# Patient Record
Sex: Female | Born: 1985
Health system: Southern US, Community
[De-identification: ages and names within clinical notes are randomized; demographics above are authoritative.]

## PROBLEM LIST (undated history)

## (undated) DIAGNOSIS — K219 Gastro-esophageal reflux disease without esophagitis: Secondary | ICD-10-CM

## (undated) DIAGNOSIS — E079 Disorder of thyroid, unspecified: Secondary | ICD-10-CM

## (undated) DIAGNOSIS — N200 Calculus of kidney: Secondary | ICD-10-CM

## (undated) DIAGNOSIS — Z9889 Other specified postprocedural states: Secondary | ICD-10-CM

## (undated) DIAGNOSIS — E559 Vitamin D deficiency, unspecified: Secondary | ICD-10-CM

## (undated) DIAGNOSIS — E063 Autoimmune thyroiditis: Secondary | ICD-10-CM

## (undated) DIAGNOSIS — I10 Essential (primary) hypertension: Secondary | ICD-10-CM

## (undated) DIAGNOSIS — J45909 Unspecified asthma, uncomplicated: Secondary | ICD-10-CM

## (undated) DIAGNOSIS — E041 Nontoxic single thyroid nodule: Secondary | ICD-10-CM

## (undated) DIAGNOSIS — R112 Nausea with vomiting, unspecified: Secondary | ICD-10-CM

## (undated) DIAGNOSIS — E88819 Insulin resistance, unspecified: Secondary | ICD-10-CM

## (undated) DIAGNOSIS — R1310 Dysphagia, unspecified: Secondary | ICD-10-CM

## (undated) DIAGNOSIS — T7840XA Allergy, unspecified, initial encounter: Secondary | ICD-10-CM

## (undated) DIAGNOSIS — Z87442 Personal history of urinary calculi: Secondary | ICD-10-CM

## (undated) DIAGNOSIS — F419 Anxiety disorder, unspecified: Secondary | ICD-10-CM

## (undated) DIAGNOSIS — C8290 Follicular lymphoma, unspecified, unspecified site: Secondary | ICD-10-CM

## (undated) DIAGNOSIS — R011 Cardiac murmur, unspecified: Secondary | ICD-10-CM

## (undated) HISTORY — PX: ABDOMINAL HYSTERECTOMY: SHX81

## (undated) HISTORY — PX: WISDOM TOOTH EXTRACTION: SHX21

## (undated) HISTORY — PX: KIDNEY STONE SURGERY: SHX686

## (undated) HISTORY — DX: Gastro-esophageal reflux disease without esophagitis: K21.9

## (undated) HISTORY — PX: TONSILLECTOMY: SUR1361

## (undated) HISTORY — DX: Allergy, unspecified, initial encounter: T78.40XA

## (undated) HISTORY — DX: Dysphagia, unspecified: R13.10

## (undated) HISTORY — DX: Disorder of thyroid, unspecified: E07.9

## (undated) HISTORY — DX: Calculus of kidney: N20.0

## (undated) HISTORY — DX: Essential (primary) hypertension: I10

## (undated) HISTORY — DX: Anxiety disorder, unspecified: F41.9

---
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
## 2016-01-03 ENCOUNTER — Ambulatory Visit (INDEPENDENT_AMBULATORY_CARE_PROVIDER_SITE_OTHER): Payer: Medicaid Other | Admitting: Family Medicine

## 2016-01-03 ENCOUNTER — Encounter: Payer: Self-pay | Admitting: Family Medicine

## 2016-01-03 VITALS — BP 118/80 | HR 78 | Temp 98.8°F | Ht 67.0 in | Wt 201.6 lb

## 2016-01-03 DIAGNOSIS — E038 Other specified hypothyroidism: Secondary | ICD-10-CM | POA: Diagnosis not present

## 2016-01-03 DIAGNOSIS — E039 Hypothyroidism, unspecified: Secondary | ICD-10-CM | POA: Insufficient documentation

## 2016-01-03 MED ORDER — ARMOUR THYROID 15 MG PO TABS: 15.0000 mg | ORAL_TABLET | Freq: Every day | ORAL | Status: DC

## 2016-01-03 NOTE — Progress Notes (Signed)
BP 118/80 mmHg  Pulse 78  Temp(Src) 98.8 F (37.1 C) (Oral)  Ht 5' 7"  (1.702 m)  Wt 201 lb 9.6 oz (91.445 kg)  BMI 31.57 kg/m2   Subjective:    Patient ID: Betty Hayes, female    DOB: October 29, 1985, 30 y.o.   MRN: [id number]  HPI: Betty Hayes is a 30 y.o. female presenting on 01/03/2016 for Establish Care   HPI Hypothyroidism Patient is currently on Armour Thyroid where she takes 30 mg 4 days a week and 50 mg 3 days a week. She says she has had issues with her thyroid and weight gain and fatigue for the past few years and has been started on the medication. She denies any issues with heat or cold. He denies any issues with diarrhea or constipation.  Relevant past medical, surgical, family and social history reviewed and updated as indicated. Interim medical history since our last visit reviewed. Allergies and medications reviewed and updated.  Review of Systems  Constitutional: Positive for fatigue and unexpected weight change. Negative for fever and chills.  HENT: Negative for congestion, ear discharge and ear pain.   Eyes: Negative for redness and visual disturbance.  Respiratory: Negative for chest tightness and shortness of breath.   Cardiovascular: Negative for chest pain and leg swelling.  Genitourinary: Negative for dysuria and difficulty urinating.  Musculoskeletal: Negative for back pain and gait problem.  Skin: Negative for rash.  Neurological: Negative for dizziness, light-headedness and headaches.  Psychiatric/Behavioral: Negative for behavioral problems and agitation.  All other systems reviewed and are negative.   Per HPI unless specifically indicated above  Social History   Social History  . Marital Status: Married    Spouse Name: N/A  . Number of Children: N/A  . Years of Education: N/A   Occupational History  . Not on file.   Social History Main Topics  . Smoking status: Never Smoker   . Smokeless tobacco: Never Used  . Alcohol Use: Yes   Comment: occasional  . Drug Use: No  . Sexual Activity: Yes    Birth Control/ Protection: Surgical     Comment: married 14 years   Other Topics Concern  . Not on file   Social History Narrative  . No narrative on file    Past Surgical History  Procedure Laterality Date  . Abdominal hysterectomy    . Tonsillectomy    . Wisdom tooth extraction    . Kidney stone surgery      Family History  Problem Relation Age of Onset  . Thrombocytopenia Mother     itp  . Heart disease Mother   . Hypertension Mother   . Seizures Brother   . ADD / ADHD Brother       Medication List       This list is accurate as of: 01/03/16  2:41 PM.  Always use your most recent med list.               ARMOUR THYROID 15 MG tablet  Generic drug:  thyroid  Take 1 tablet (15 mg total) by mouth daily. 1 daily 3/7 days and 2 daily 4/7     fexofenadine 180 MG tablet  Commonly known as:  ALLEGRA  Take 180 mg by mouth daily.           Objective:    BP 118/80 mmHg  Pulse 78  Temp(Src) 98.8 F (37.1 C) (Oral)  Ht 5' 7"  (1.702 m)  Wt 201 lb 9.6 oz (  91.445 kg)  BMI 31.57 kg/m2  Wt Readings from Last 3 Encounters:  01/03/16 201 lb 9.6 oz (91.445 kg)    Physical Exam  Constitutional: She is oriented to person, place, and time. She appears well-developed and well-nourished. No distress.  HENT:  Right Ear: External ear normal.  Nose: Nose normal.  Mouth/Throat: Oropharynx is clear and moist. No oropharyngeal exudate.  Eyes: Conjunctivae and EOM are normal. Pupils are equal, round, and reactive to light.  Neck: Neck supple. No thyromegaly present.  Cardiovascular: Normal rate, regular rhythm, normal heart sounds and intact distal pulses.   No murmur heard. Pulmonary/Chest: Effort normal and breath sounds normal. No respiratory distress. She has no wheezes.  Musculoskeletal: Normal range of motion. She exhibits no edema or tenderness.  Lymphadenopathy:    She has no cervical adenopathy.    Neurological: She is alert and oriented to person, place, and time. Coordination normal.  Skin: Skin is warm and dry. No rash noted. She is not diaphoretic.  Psychiatric: She has a normal mood and affect. Her behavior is normal.  Nursing note and vitals reviewed.   No results found for this or any previous visit.    Assessment & Plan:   Problem List Items Addressed This Visit      Endocrine   Hypothyroidism - Primary   Relevant Medications   ARMOUR THYROID 15 MG tablet   Other Relevant Orders   Thyroid Panel With TSH   Ambulatory referral to Endocrinology      Follow up plan: Return in about 6 weeks (around 02/14/2016), or if symptoms worsen or fail to improve, for thyroid.  Caryl Pina, MD [facility] 01/03/2016, 2:41 PM

## 2016-01-04 ENCOUNTER — Other Ambulatory Visit: Payer: Self-pay

## 2016-01-04 DIAGNOSIS — E039 Hypothyroidism, unspecified: Secondary | ICD-10-CM

## 2016-01-04 LAB — THYROID PANEL WITH TSH
Free Thyroxine Index: 1.5 (ref 1.2–4.9)
T3 Uptake Ratio: 24 % (ref 24–39)
T4, Total: 6.2 ug/dL (ref 4.5–12.0)
TSH: 4.96 u[IU]/mL — ABNORMAL HIGH (ref 0.450–4.500)

## 2016-02-05 ENCOUNTER — Encounter: Payer: Self-pay | Admitting: Family

## 2016-02-05 ENCOUNTER — Ambulatory Visit (INDEPENDENT_AMBULATORY_CARE_PROVIDER_SITE_OTHER): Payer: Medicaid Other | Admitting: Family

## 2016-02-05 VITALS — BP 118/86 | HR 64 | Temp 98.2°F | Ht 67.0 in | Wt 198.2 lb

## 2016-02-05 DIAGNOSIS — H60391 Other infective otitis externa, right ear: Secondary | ICD-10-CM

## 2016-02-05 MED ORDER — OFLOXACIN 0.3 % OT SOLN: 5.0000 [drp] | Freq: Every day | OTIC | Status: DC

## 2016-02-05 NOTE — Progress Notes (Signed)
   Subjective:    Patient ID: Betty Hayes, female    DOB: 21-Apr-1986, 30 y.o.   MRN: [id number]  Otalgia  There is pain in the right ear. This is a new problem. The current episode started in the past 7 days. The problem occurs constantly. The problem has been gradually worsening. The pain is at a severity of 5/10. The pain is mild. Associated symptoms include hearing loss. Pertinent negatives include no coughing, diarrhea, ear discharge, headaches, rhinorrhea or sore throat. She has tried acetaminophen for the symptoms. The treatment provided mild relief.      Review of Systems  Constitutional: Negative.   HENT: Positive for ear pain and hearing loss. Negative for ear discharge, rhinorrhea and sore throat.   Eyes: Negative.   Respiratory: Negative.  Negative for cough and shortness of breath.   Cardiovascular: Negative.  Negative for palpitations.  Gastrointestinal: Negative.  Negative for diarrhea.  Endocrine: Negative.   Genitourinary: Negative.   Musculoskeletal: Negative.   Neurological: Negative.  Negative for headaches.  Hematological: Negative.   Psychiatric/Behavioral: Negative.   All other systems reviewed and are negative.      Objective:   Physical Exam  Constitutional: She is oriented to person, place, and time. She appears well-developed and well-nourished. No distress.  HENT:  Head: Normocephalic and atraumatic.  Right Ear: There is swelling and tenderness.  Mouth/Throat: Oropharynx is clear and moist.  Eyes: Pupils are equal, round, and reactive to light.  Neck: Normal range of motion. Neck supple. No thyromegaly present.  Cardiovascular: Normal rate, regular rhythm, normal heart sounds and intact distal pulses.   No murmur heard. Pulmonary/Chest: Effort normal and breath sounds normal. No respiratory distress. She has no wheezes.  Abdominal: Soft. Bowel sounds are normal. She exhibits no distension. There is no tenderness.  Musculoskeletal: Normal range of  motion. She exhibits no edema or tenderness.  Neurological: She is alert and oriented to person, place, and time. She has normal reflexes. No cranial nerve deficit.  Skin: Skin is warm and dry.  Psychiatric: She has a normal mood and affect. Her behavior is normal. Judgment and thought content normal.  Vitals reviewed.     BP 118/86 mmHg  Pulse 64  Temp(Src) 98.2 F (36.8 C) (Oral)  Ht 5\' 7"  (1.702 m)  Wt 198 lb 3.2 oz (89.903 kg)  BMI 31.04 kg/m2     Assessment & Plan:  1. Otitis, externa, infective, right -Keep clean and dry -Avoid swimming in dirty water -Avoid water in the ear -Tylenol prn  -RTO prn - ofloxacin (FLOXIN OTIC) 0.3 % otic solution; Place 5 drops into the right ear daily.  Dispense: 5 mL; Refill: 0  Evelina Dun, FNP

## 2016-02-05 NOTE — Patient Instructions (Signed)
Otitis Externa Otitis externa is a bacterial or fungal infection of the outer ear canal. This is the area from the eardrum to the outside of the ear. Otitis externa is sometimes called "swimmer's ear." CAUSES  Possible causes of infection include:  Swimming in dirty water.  Moisture remaining in the ear after swimming or bathing.  Mild injury (trauma) to the ear.  Objects stuck in the ear (foreign body).  Cuts or scrapes (abrasions) on the outside of the ear. SIGNS AND SYMPTOMS  The first symptom of infection is often itching in the ear canal. Later signs and symptoms may include swelling and redness of the ear canal, ear pain, and yellowish-white fluid (pus) coming from the ear. The ear pain may be worse when pulling on the earlobe. DIAGNOSIS  Your health care provider will perform a physical exam. A sample of fluid may be taken from the ear and examined for bacteria or fungi. TREATMENT  Antibiotic ear drops are often given for 10 to 14 days. Treatment may also include pain medicine or corticosteroids to reduce itching and swelling. HOME CARE INSTRUCTIONS   Apply antibiotic ear drops to the ear canal as prescribed by your health care provider.  Take medicines only as directed by your health care provider.  If you have diabetes, follow any additional treatment instructions from your health care provider.  Keep all follow-up visits as directed by your health care provider. PREVENTION   Keep your ear dry. Use the corner of a towel to absorb water out of the ear canal after swimming or bathing.  Avoid scratching or putting objects inside your ear. This can damage the ear canal or remove the protective wax that lines the canal. This makes it easier for bacteria and fungi to grow.  Avoid swimming in lakes, polluted water, or poorly chlorinated pools.  You may use ear drops made of rubbing alcohol and vinegar after swimming. Combine equal parts of white vinegar and alcohol in a bottle.  Put 3 or 4 drops into each ear after swimming. SEEK MEDICAL CARE IF:   You have a fever.  Your ear is still red, swollen, painful, or draining pus after 3 days.  Your redness, swelling, or pain gets worse.  You have a severe headache.  You have redness, swelling, pain, or tenderness in the area behind your ear. MAKE SURE YOU:   Understand these instructions.  Will watch your condition.  Will get help right away if you are not doing well or get worse.   This information is not intended to replace advice given to you by your health care provider. Make sure you discuss any questions you have with your health care provider.   Document Released: 07/07/2005 Document Revised: 07/28/2014 Document Reviewed: 07/24/2011 Elsevier Interactive Patient Education [employer].

## 2016-02-13 ENCOUNTER — Ambulatory Visit (INDEPENDENT_AMBULATORY_CARE_PROVIDER_SITE_OTHER): Payer: Medicaid Other | Admitting: "Endocrinology

## 2016-02-13 ENCOUNTER — Encounter: Payer: Self-pay | Admitting: "Endocrinology

## 2016-02-13 VITALS — BP 124/60 | HR 78 | Resp 18 | Ht 67.0 in | Wt 198.0 lb

## 2016-02-13 DIAGNOSIS — E038 Other specified hypothyroidism: Secondary | ICD-10-CM

## 2016-02-13 MED ORDER — LEVOTHYROXINE SODIUM 50 MCG PO TABS: 50.0000 ug | ORAL_TABLET | Freq: Every day | ORAL | 2 refills | Status: DC

## 2016-02-13 NOTE — Progress Notes (Signed)
Subjective:    Patient ID: Betty Hayes, female    DOB: 02-23-1986, PCP Fransisca Kaufmann Dettinger, MD   Past Medical History:  Diagnosis Date  . Allergy   . Thyroid disease    Past Surgical History:  Procedure Laterality Date  . ABDOMINAL HYSTERECTOMY    . KIDNEY STONE SURGERY    . TONSILLECTOMY    . WISDOM TOOTH EXTRACTION     Social History   Social History  . Marital status: Married    Spouse name: N/A  . Number of children: N/A  . Years of education: N/A   Social History Main Topics  . Smoking status: Never Smoker  . Smokeless tobacco: Never Used  . Alcohol use Yes     Comment: occasional  . Drug use: No  . Sexual activity: Yes    Birth control/ protection: Surgical     Comment: married 14 years   Other Topics Concern  . None   Social History Narrative  . None   Outpatient Encounter Prescriptions as of 02/13/2016  Medication Sig  . fexofenadine (ALLEGRA) 180 MG tablet Take 180 mg by mouth daily.  . [DISCONTINUED] ARMOUR THYROID 15 MG tablet Take 1 tablet (15 mg total) by mouth daily. 1 daily 3/7 days and 2 daily 4/7  . levothyroxine (SYNTHROID, LEVOTHROID) 50 MCG tablet Take 1 tablet (50 mcg total) by mouth daily.  . [DISCONTINUED] levothyroxine (SYNTHROID, LEVOTHROID) 50 MCG tablet Take 1 tablet (50 mcg total) by mouth daily.  . [DISCONTINUED] ofloxacin (FLOXIN OTIC) 0.3 % otic solution Place 5 drops into the right ear daily.   No facility-administered encounter medications on file as of 02/13/2016.    ALLERGIES: Allergies  Allergen Reactions  . Latex Rash   VACCINATION STATUS:  There is no immunization history on file for this patient.  HPI  30 year old female patient with medical history as above. She is being seen in consultation for hypothyroidism requested by Dr. Warrick Parisian. She reports that she was diagnosed with hypothyroidism 4 years ago in [place]. She was tried on several doses of thyroid hormone including 50 g of levothyroxine which caused  anxiety before she was switched to Armour thyroid currently taking 30 mg 4 days week and 50 mg 3 days a week. -She always had difficulty regulating her thyroid function tests. -She denies family history of thyroid dysfunction. She denies personal history of goiter. She claims to be consistent in taking her thyroid hormone. -She never tried Synthroid. She has 2 children .  Review of Systems  Constitutional: no recent weight change , + fatigue, + subjective hypothermia Eyes: no blurry vision, no xerophthalmia ENT: no sore throat, no nodules palpated in throat, no dysphagia/odynophagia, no hoarseness Cardiovascular: no CP/SOB/palpitations/leg swelling Respiratory: no cough/SOB Gastrointestinal: no N/V/D/C Musculoskeletal: no muscle/joint aches Skin: no rashes Neurological: no tremors/numbness/tingling/dizziness Psychiatric: no depression/anxiety  Objective:    BP 124/60 (BP Location: Right Arm, Patient Position: Sitting, Cuff Size: Normal)   Pulse 78   Resp 18   Ht 5\' 7"  (1.702 m)   Wt 198 lb (89.8 kg)   SpO2 99%   BMI 31.01 kg/m   Wt Readings from Last 3 Encounters:  02/13/16 198 lb (89.8 kg)  02/05/16 198 lb 3.2 oz (89.9 kg)  01/03/16 201 lb 9.6 oz (91.4 kg)    Physical Exam Constitutional: overweight, in NAD Eyes: PERRLA, EOMI, no exophthalmos ENT: moist mucous membranes, no thyromegaly, no cervical lymphadenopathy Cardiovascular: RRR, No MRG Respiratory: CTA B Gastrointestinal: abdomen soft, NT, ND,  BS+ Musculoskeletal: no deformities, strength intact in all 4 Skin: moist, warm, no rashes Neurological: no tremor with outstretched hands, DTR normal in all 4  Results for Betty Hayes, Betty Hayes (MRN [id number]) as of 02/13/2016 12:23  Ref. Range 01/03/2016 14:44  TSH Latest Ref Range: 0.450 - 4.500 uIU/mL 4.960 (H)  Thyroxine (T4) Latest Ref Range: 4.5 - 12.0 ug/dL 6.2  Free Thyroxine Index Latest Ref Range: 1.2 - 4.9  1.5  T3 Uptake Ratio Latest Ref Range: 24 - 39 % 24      Assessment & Plan:   1. Other specified hypothyroidism - I have reviewed her history , records, and clinically evaluated patient. -She has primary hypothyroidism a theology not clear. - I will include anti-thyroid antibodies during her next lab work to see if she has autoimmune background. We discussed her options for thyroid hormone replacement. -She will do better with Synthroid than Armour Thyroid. Given her history of intolerance to initial trial of levothyroxine, I will avoid levothyroxine specifically at large doses. - I will initiate Synthroid 50 g by mouth every morning. She will discontinue Armour Thyroid as of today.  - We discussed about correct intake of levothyroxine, at fasting, with water, separated by at least 30 minutes from breakfast, and separated by more than 4 hours from calcium, iron, multivitamins, acid reflux medications (PPIs). -Patient is made aware of the fact that thyroid hormone replacement is needed for life, dose to be adjusted by periodic monitoring of thyroid function tests.   - I advised patient to maintain close follow up with Worthy Rancher, MD for primary care needs. Follow up plan: Return in about 3 months (around 05/15/2016) for follow up with pre-visit labs.  Glade Lloyd, MD Phone: [phone]  Fax: [phone]   02/13/2016, 12:20 PM

## 2016-02-14 ENCOUNTER — Ambulatory Visit: Payer: Medicaid Other | Admitting: Family Medicine

## 2016-05-05 ENCOUNTER — Other Ambulatory Visit: Payer: Self-pay | Admitting: "Endocrinology

## 2016-05-14 ENCOUNTER — Other Ambulatory Visit (INDEPENDENT_AMBULATORY_CARE_PROVIDER_SITE_OTHER): Payer: Medicaid Other

## 2016-05-14 LAB — TSH: TSH: 3.1 u[IU]/mL (ref <=5.90)

## 2016-05-15 LAB — TSH: TSH: 3.1 u[IU]/mL (ref 0.450–4.500)

## 2016-05-15 LAB — THYROGLOBULIN ANTIBODY: Thyroglobulin Antibody: 1.9 IU/mL — ABNORMAL HIGH (ref 0.0–0.9)

## 2016-05-15 LAB — T4, FREE: Free T4: 1.3 ng/dL (ref 0.82–1.77)

## 2016-05-15 LAB — THYROID PEROXIDASE ANTIBODY: Thyroperoxidase Ab SerPl-aCnc: 474 IU/mL — ABNORMAL HIGH (ref 0–34)

## 2016-05-22 ENCOUNTER — Encounter: Payer: Self-pay | Admitting: "Endocrinology

## 2016-05-22 ENCOUNTER — Ambulatory Visit (INDEPENDENT_AMBULATORY_CARE_PROVIDER_SITE_OTHER): Payer: Medicaid Other | Admitting: "Endocrinology

## 2016-05-22 VITALS — BP 108/70 | HR 90 | Resp 18 | Ht 67.0 in | Wt 205.0 lb

## 2016-05-22 DIAGNOSIS — E038 Other specified hypothyroidism: Secondary | ICD-10-CM

## 2016-05-22 DIAGNOSIS — E063 Autoimmune thyroiditis: Secondary | ICD-10-CM | POA: Diagnosis not present

## 2016-05-22 MED ORDER — LEVOTHYROXINE SODIUM 75 MCG PO TABS: 75.0000 ug | ORAL_TABLET | Freq: Every day | ORAL | 2 refills | Status: DC

## 2016-05-22 NOTE — Progress Notes (Signed)
Subjective:    Patient ID: Betty Hayes, female    DOB: 07/20/1986, PCP Fransisca Kaufmann Dettinger, MD   Past Medical History:  Diagnosis Date  . Allergy   . Thyroid disease    Past Surgical History:  Procedure Laterality Date  . ABDOMINAL HYSTERECTOMY    . KIDNEY STONE SURGERY    . TONSILLECTOMY    . WISDOM TOOTH EXTRACTION     Social History   Social History  . Marital status: Married    Spouse name: N/A  . Number of children: N/A  . Years of education: N/A   Social History Main Topics  . Smoking status: Never Smoker  . Smokeless tobacco: Never Used  . Alcohol use Yes     Comment: occasional  . Drug use: No  . Sexual activity: Yes    Birth control/ protection: Surgical     Comment: married 14 years   Other Topics Concern  . Not on file   Social History Narrative  . No narrative on file   Outpatient Encounter Prescriptions as of 05/22/2016  Medication Sig  . fexofenadine (ALLEGRA) 180 MG tablet Take 180 mg by mouth daily.  . [DISCONTINUED] SYNTHROID 50 MCG tablet TAKE 1 TABLET EVERY DAY  . levothyroxine (SYNTHROID) 75 MCG tablet Take 1 tablet (75 mcg total) by mouth daily before breakfast.   No facility-administered encounter medications on file as of 05/22/2016.    ALLERGIES: Allergies  Allergen Reactions  . Latex Rash   VACCINATION STATUS:  There is no immunization history on file for this patient.  HPI  30 year old female patient with medical history as above. She is being seen in f/u for hypothyroidism. She was switched from Armour thyroid 2 Synthroid last visit. Her labs are showing improvement and confirm Hashimoto's thyroiditis as a cause of her hypothyroidism.. She reports that she was diagnosed with hypothyroidism 4 years ago in [place]. -She always had difficulty regulating her thyroid function tests, when she was on Armour Thyroid. -She denies family history of thyroid dysfunction. She denies personal history of goiter. She claims to be  consistent in taking her thyroid hormone. - She has 2 children .  Review of Systems  Constitutional: She gained 7 pounds since last visit , + fatigue, + subjective hypothermia Eyes: no blurry vision, no xerophthalmia ENT: no sore throat, no nodules palpated in throat, no dysphagia/odynophagia, no hoarseness Cardiovascular: no CP/SOB/palpitations/leg swelling Respiratory: no cough/SOB Gastrointestinal: no N/V/D/C Musculoskeletal: no muscle/joint aches Skin: Complains of acne. Neurological: no tremors/numbness/tingling/dizziness Psychiatric: no depression/anxiety  Objective:    BP 108/70   Pulse 90   Resp 18   Ht 5\' 7"  (1.702 m)   Wt 205 lb (93 kg)   SpO2 99%   BMI 32.11 kg/m   Wt Readings from Last 3 Encounters:  05/22/16 205 lb (93 kg)  02/13/16 198 lb (89.8 kg)  02/05/16 198 lb 3.2 oz (89.9 kg)    Physical Exam Constitutional: overweight, in NAD Eyes: PERRLA, EOMI, no exophthalmos ENT: moist mucous membranes, no thyromegaly, no cervical lymphadenopathy Cardiovascular: RRR, No MRG Respiratory: CTA B Gastrointestinal: abdomen soft, NT, ND, BS+ Musculoskeletal: no deformities, strength intact in all 4 Skin: moist, warm, no rashes Neurological: no tremor with outstretched hands, DTR normal in all 4  Recent Results (from the past 2160 hour(s))  TSH     Status: None   Collection Time: 05/14/16 12:00 AM  Result Value Ref Range   TSH 3.10 0.41 - 5.90 uIU/mL    Comment:  free t4, thyroglobulin abselevated at 1.9, TPO abs high @474       Assessment & Plan:   1. Other specified hypothyroidism 2. Hashimoto's thyroiditis - Her repeat thyroid function tests are showing improvement although she would benefit from slight increase in her Synthroid dose. And these labs confirm Hashimoto's thyroiditis as a cause of her hypothyroidism. - I will increase Synthroid to 75 g by mouth every morning.   - We discussed about correct intake of levothyroxine, at fasting, with water,  separated by at least 30 minutes from breakfast, and separated by more than 4 hours from calcium, iron, multivitamins, acid reflux medications (PPIs). -Patient is made aware of the fact that thyroid hormone replacement is needed for life, dose to be adjusted by periodic monitoring of thyroid function tests.  -Synthroid is unlikely to be the cause of her acne. She states she is initiated on Spironolactone by her primary care doctor. - I will add A1c to her next labs due to concern over recent weight gain. - I advised patient to maintain close follow up with Worthy Rancher, MD for primary care needs. Follow up plan: Return in about 3 months (around 08/22/2016) for follow up with pre-visit labs.  Glade Lloyd, MD Phone: [phone]  Fax: [phone]   05/22/2016, 11:35 AM

## 2016-08-15 ENCOUNTER — Other Ambulatory Visit: Payer: Self-pay | Admitting: "Endocrinology

## 2016-08-15 ENCOUNTER — Other Ambulatory Visit: Payer: Medicaid Other

## 2016-08-16 LAB — SPECIMEN STATUS

## 2016-08-19 LAB — TSH: TSH: 0.096 u[IU]/mL — ABNORMAL LOW (ref 0.450–4.500)

## 2016-08-19 LAB — T4, FREE: Free T4: 1.94 ng/dL — ABNORMAL HIGH (ref 0.82–1.77)

## 2016-08-20 LAB — HGB A1C W/O EAG: Hgb A1c MFr Bld: 5 % (ref 4.8–5.6)

## 2016-08-20 LAB — SPECIMEN STATUS REPORT

## 2016-08-26 ENCOUNTER — Other Ambulatory Visit: Payer: Self-pay | Admitting: "Endocrinology

## 2016-08-26 ENCOUNTER — Encounter: Payer: Self-pay | Admitting: "Endocrinology

## 2016-08-26 ENCOUNTER — Ambulatory Visit (INDEPENDENT_AMBULATORY_CARE_PROVIDER_SITE_OTHER): Payer: Medicaid Other | Admitting: "Endocrinology

## 2016-08-26 VITALS — BP 134/88 | HR 83 | Ht 67.0 in | Wt 200.0 lb

## 2016-08-26 DIAGNOSIS — E063 Autoimmune thyroiditis: Secondary | ICD-10-CM

## 2016-08-26 DIAGNOSIS — E038 Other specified hypothyroidism: Secondary | ICD-10-CM | POA: Diagnosis not present

## 2016-08-26 MED ORDER — LEVOTHYROXINE SODIUM 50 MCG PO CAPS: 50.0000 ug | ORAL_CAPSULE | Freq: Every day | ORAL | 6 refills | Status: DC

## 2016-08-26 MED ORDER — LEVOTHYROXINE SODIUM 50 MCG PO CAPS
50.0000 ug | ORAL_CAPSULE | Freq: Every day | ORAL | 6 refills | Status: DC
Start: 2016-08-26 — End: 2016-08-26

## 2016-08-26 NOTE — Progress Notes (Signed)
Subjective:    Patient ID: Betty Hayes, female    DOB: 04/06/1986, PCP Fransisca Kaufmann Dettinger, MD   Past Medical History:  Diagnosis Date  . Allergy   . Thyroid disease    Past Surgical History:  Procedure Laterality Date  . ABDOMINAL HYSTERECTOMY    . KIDNEY STONE SURGERY    . TONSILLECTOMY    . WISDOM TOOTH EXTRACTION     Social History   Social History  . Marital status: Married    Spouse name: N/A  . Number of children: N/A  . Years of education: N/A   Social History Main Topics  . Smoking status: Never Smoker  . Smokeless tobacco: Never Used  . Alcohol use Yes     Comment: occasional  . Drug use: No  . Sexual activity: Yes    Birth control/ protection: Surgical     Comment: married 14 years   Other Topics Concern  . None   Social History Narrative  . None   Outpatient Encounter Prescriptions as of 08/26/2016  Medication Sig  . fexofenadine (ALLEGRA) 180 MG tablet Take 180 mg by mouth daily.  . Levothyroxine Sodium 50 MCG CAPS Take 1 capsule (50 mcg total) by mouth daily before breakfast.  . [DISCONTINUED] levothyroxine (SYNTHROID) 75 MCG tablet Take 1 tablet (75 mcg total) by mouth daily before breakfast.   No facility-administered encounter medications on file as of 08/26/2016.    ALLERGIES: Allergies  Allergen Reactions  . Latex Rash   VACCINATION STATUS:  There is no immunization history on file for this patient.  HPI  31 year old female patient with medical history as above. She is being seen in f/u for hypothyroidism.  Her labs are confirmed Hashimoto's thyroiditis as a cause of her hypothyroidism.. She reports that she was diagnosed with hypothyroidism 5 years ago in [place]. -She always had difficulty regulating her thyroid function tests, when she was on [employer] Thyroid. - She is currently on levothyroxine 75 g by mouth every morning. She is tolerating this medication very well. -She denies family history of thyroid dysfunction. She denies  personal history of goiter. She claims to be consistent in taking her thyroid hormone. - She has 2 children .  Review of Systems  Constitutional: She lost 5 pounds since last visit,- fatigue, + + subjective hyperthermia and sweating.  Eyes: no blurry vision, no xerophthalmia ENT: no sore throat, no nodules palpated in throat, no dysphagia/odynophagia, no hoarseness Cardiovascular: no CP/SOB/palpitations/leg swelling Respiratory: no cough/SOB Gastrointestinal: no N/V/D/C Musculoskeletal: no muscle/joint aches Skin: Complains of acne. Neurological: no tremors/numbness/tingling/dizziness Psychiatric: no depression/anxiety  Objective:    BP 134/88   Pulse 83   Ht 5\' 7"  (1.702 m)   Wt 200 lb (90.7 kg)   BMI 31.32 kg/m   Wt Readings from Last 3 Encounters:  08/26/16 200 lb (90.7 kg)  05/22/16 205 lb (93 kg)  02/13/16 198 lb (89.8 kg)    Physical Exam Constitutional: overweight, in NAD Eyes: PERRLA, EOMI, no exophthalmos ENT: moist mucous membranes, no thyromegaly, no cervical lymphadenopathy Cardiovascular: RRR, No MRG Respiratory: CTA B Gastrointestinal: abdomen soft, NT, ND, BS+ Musculoskeletal: no deformities, strength intact in all 4 Skin: moist, warm, no rashes Neurological: no tremor with outstretched hands, DTR normal in all 4  Recent Results (from the past 2160 hour(s))  Specimen Status     Status: None (Preliminary result)   Collection Time: 08/15/16 12:00 AM  Result Value Ref Range   WBC WILL FOLLOW  RBC WILL FOLLOW    Hemoglobin WILL FOLLOW    Hematocrit WILL FOLLOW    MCV WILL FOLLOW    MCH WILL FOLLOW    MCHC WILL FOLLOW    RDW WILL FOLLOW    Platelets WILL FOLLOW    Neutrophils WILL FOLLOW    Lymphs WILL FOLLOW    Monocytes WILL FOLLOW    Eos WILL FOLLOW    Basos WILL FOLLOW    Neutrophils Absolute WILL FOLLOW    Lymphocytes Absolute WILL FOLLOW    Monocytes Absolute WILL FOLLOW    EOS (ABSOLUTE) WILL FOLLOW    Basophils Absolute WILL FOLLOW     Immature Granulocytes WILL FOLLOW    Immature Grans (Abs) WILL FOLLOW   T4, free     Status: Abnormal   Collection Time: 08/15/16 12:00 AM  Result Value Ref Range   Free T4 1.94 (H) 0.82 - 1.77 ng/dL  TSH     Status: Abnormal   Collection Time: 08/15/16 12:00 AM  Result Value Ref Range   TSH 0.096 (L) 0.450 - 4.500 uIU/mL  Hgb A1c w/o eAG     Status: None   Collection Time: 08/15/16 12:00 AM  Result Value Ref Range   Hgb A1c MFr Bld 5.0 4.8 - 5.6 %    Comment:          Pre-diabetes: 5.7 - 6.4          Diabetes: >6.4          Glycemic control for adults with diabetes: <7.0   Specimen status report     Status: None   Collection Time: 08/15/16 12:00 AM  Result Value Ref Range   specimen status report Comment     Comment: Tracking Missed Test Written Authorization Written Authorization Written Authorization Received. Authorization received from [place] 08-19-2016 Logged by [employer]:   1. Other specified hypothyroidism 2. Hashimoto's thyroiditis - Her repeat thyroid function tests are Consistent with over replacement with levothyroxine. - I will decrease Synthroid to 50 g by mouth every morning.   - We discussed about correct intake of levothyroxine, at fasting, with water, separated by at least 30 minutes from breakfast, and separated by more than 4 hours from calcium, iron, multivitamins, acid reflux medications (PPIs). -Patient is made aware of the fact that thyroid hormone replacement is needed for life, dose to be adjusted by periodic monitoring of thyroid function tests.  - Her A1c is 5% indicating absence of diabetes/prediabetes for now. I discussed carbs and exercise regimen for her to control her weight. - I advised patient to maintain close follow up with Worthy Rancher, MD for primary care needs. Follow up plan: Return in about 6 months (around 02/23/2017) for follow up with pre-visit labs.  Glade Lloyd, MD Phone:  [phone]  Fax: [phone]   08/26/2016, 11:37 AM

## 2017-02-16 ENCOUNTER — Other Ambulatory Visit: Payer: Self-pay | Admitting: "Endocrinology

## 2017-02-16 ENCOUNTER — Other Ambulatory Visit: Payer: Managed Care, Other (non HMO)

## 2017-02-16 DIAGNOSIS — E038 Other specified hypothyroidism: Secondary | ICD-10-CM

## 2017-02-16 DIAGNOSIS — E063 Autoimmune thyroiditis: Principal | ICD-10-CM

## 2017-02-16 NOTE — Progress Notes (Signed)
NOTED THANKS DR NIDA

## 2017-02-17 LAB — T4, FREE: Free T4: 1.19 ng/dL (ref 0.82–1.77)

## 2017-02-17 LAB — TSH: TSH: 3.65 u[IU]/mL (ref 0.450–4.500)

## 2017-02-24 ENCOUNTER — Encounter: Payer: Self-pay | Admitting: "Endocrinology

## 2017-02-24 ENCOUNTER — Ambulatory Visit (INDEPENDENT_AMBULATORY_CARE_PROVIDER_SITE_OTHER): Payer: Managed Care, Other (non HMO) | Admitting: "Endocrinology

## 2017-02-24 VITALS — BP 112/79 | HR 74 | Wt 201.0 lb

## 2017-02-24 DIAGNOSIS — E038 Other specified hypothyroidism: Secondary | ICD-10-CM | POA: Diagnosis not present

## 2017-02-24 DIAGNOSIS — E063 Autoimmune thyroiditis: Secondary | ICD-10-CM

## 2017-02-24 MED ORDER — LEVOTHYROXINE SODIUM 50 MCG PO CAPS: 50.0000 ug | ORAL_CAPSULE | Freq: Every day | ORAL | 6 refills | Status: DC

## 2017-02-24 NOTE — Progress Notes (Signed)
Subjective:    Patient ID: Betty Hayes, female    DOB: 06-Dec-1985, PCP Dettinger, Fransisca Kaufmann, MD   Past Medical History:  Diagnosis Date  . Allergy   . Thyroid disease    Past Surgical History:  Procedure Laterality Date  . ABDOMINAL HYSTERECTOMY    . KIDNEY STONE SURGERY    . TONSILLECTOMY    . WISDOM TOOTH EXTRACTION     Social History   Social History  . Marital status: Married    Spouse name: N/A  . Number of children: N/A  . Years of education: N/A   Social History Main Topics  . Smoking status: Never Smoker  . Smokeless tobacco: Never Used  . Alcohol use Yes     Comment: occasional  . Drug use: No  . Sexual activity: Yes    Birth control/ protection: Surgical     Comment: married 14 years   Other Topics Concern  . None   Social History Narrative  . None   Outpatient Encounter Prescriptions as of 02/24/2017  Medication Sig  . fexofenadine (ALLEGRA) 180 MG tablet Take 180 mg by mouth daily.  . Levothyroxine Sodium 50 MCG CAPS Take 1 capsule (50 mcg total) by mouth daily before breakfast.  . [DISCONTINUED] Levothyroxine Sodium 50 MCG CAPS Take 1 capsule (50 mcg total) by mouth daily before breakfast.   No facility-administered encounter medications on file as of 02/24/2017.    ALLERGIES: Allergies  Allergen Reactions  . Latex Rash   VACCINATION STATUS:  There is no immunization history on file for this patient.  HPI  31 year old female patient with medical history as above. She is being seen in f/u for hypothyroidism due to Hashimoto's thyroiditis. - - She is currently on levothyroxine 50 g by mouth every morning. She is tolerating this medication very well. -She denies family history of thyroid dysfunction. She denies personal history of goiter. She claims to be consistent in taking her thyroid hormone, she complains of fatigue. She reports that she does not sleep with, waking up every hour. She denies history of sleep apnea. - he has 2 children  ,  23 and 28 years old.   Review of Systems  Constitutional: + has steady weight ,+ fatigue, . Eyes: no blurry vision, no xerophthalmia ENT: no sore throat, no nodules palpated in throat, no dysphagia/odynophagia, no hoarseness Cardiovascular: no CP/SOB/palpitations/leg swelling Respiratory: no cough/SOB Gastrointestinal: no N/V/D/C Musculoskeletal: no muscle/joint aches Skin: Complains of acne. Neurological: no tremors/numbness/tingling/dizziness Psychiatric: no depression/anxiety  Objective:    BP 112/79   Pulse 74   Wt 201 lb (91.2 kg)   SpO2 96%   BMI 31.48 kg/m   Wt Readings from Last 3 Encounters:  02/24/17 201 lb (91.2 kg)  08/26/16 200 lb (90.7 kg)  05/22/16 205 lb (93 kg)    Physical Exam Constitutional: Obese, in NAD Eyes: PERRLA, EOMI, no exophthalmos ENT: moist mucous membranes, no thyromegaly, no cervical lymphadenopathy Cardiovascular: RRR, No MRG Respiratory: CTA B Gastrointestinal: abdomen soft, NT, ND, BS+ Musculoskeletal: no deformities, strength intact in all 4 Skin: moist, warm, no rashes Neurological: no tremor with outstretched hands, DTR normal in all 4  Recent Results (from the past 2160 hour(s))  TSH     Status: None   Collection Time: 02/16/17 12:00 AM  Result Value Ref Range   TSH 3.650 0.450 - 4.500 uIU/mL  T4, free     Status: None   Collection Time: 02/16/17 12:00 AM  Result Value Ref Range  Free T4 1.19 0.82 - 1.77 ng/dL      Assessment & Plan:   1. Other specified hypothyroidism 2. Hashimoto's thyroiditis - Her repeat thyroid function tests are Consistent with appropriate  replacement with levothyroxine. - I will  continue Synthroid to 50 g by mouth every morning.   - We discussed about correct intake of levothyroxine, at fasting, with water, separated by at least 30 minutes from breakfast, and separated by more than 4 hours from calcium, iron, multivitamins, acid reflux medications (PPIs). -Patient is made aware of the fact  that thyroid hormone replacement is needed for life, dose to be adjusted by periodic monitoring of thyroid function tests.  - Her A1c is 5% indicating absence of diabetes/prediabetes for now. I discussed carbs and exercise regimen for her to control her weight.  - She does have a sleep problem affecting her energy level. I advised her to seek for sleep studies. Regular exercise and some weight loss would also help, I discussed exercise regimen once again with her.   - I advised patient to maintain close follow up with Dettinger, Fransisca Kaufmann, MD for primary care needs. Follow up plan: Return in about 3 months (around 05/27/2017) for follow up with pre-visit labs.  Glade Lloyd, MD Phone: [phone]  Fax: [phone]   02/24/2017, 8:46 AM

## 2017-03-10 ENCOUNTER — Encounter: Payer: Self-pay | Admitting: Family

## 2017-03-10 ENCOUNTER — Ambulatory Visit (INDEPENDENT_AMBULATORY_CARE_PROVIDER_SITE_OTHER): Payer: Managed Care, Other (non HMO) | Admitting: Family

## 2017-03-10 VITALS — BP 131/84 | HR 79 | Temp 99.6°F | Ht 67.0 in | Wt 202.0 lb

## 2017-03-10 DIAGNOSIS — S91312D Laceration without foreign body, left foot, subsequent encounter: Secondary | ICD-10-CM | POA: Diagnosis not present

## 2017-03-10 DIAGNOSIS — M25675 Stiffness of left foot, not elsewhere classified: Secondary | ICD-10-CM

## 2017-03-10 MED ORDER — MUPIROCIN 2 % EX OINT: 1.0000 "application " | TOPICAL_OINTMENT | Freq: Two times a day (BID) | CUTANEOUS | 0 refills | Status: DC

## 2017-03-10 NOTE — Patient Instructions (Signed)
Suture Removal, Care After Refer to this sheet in the next few weeks. These instructions provide you with information on caring for yourself after your procedure. Your health care provider may also give you more specific instructions. Your treatment has been planned according to current medical practices, but problems sometimes occur. Call your health care provider if you have any problems or questions after your procedure. What can I expect after the procedure? After your stitches (sutures) are removed, it is typical to have the following:  Some discomfort and swelling in the wound area.  Slight redness in the area.  Follow these instructions at home:  If you have skin adhesive strips over the wound area, do not take the strips off. They will fall off on their own in a few days. If the strips remain in place after 14 days, you may remove them.  Change any bandages (dressings) at least once a day or as directed by your health care provider. If the bandage sticks, soak it off with warm, soapy water.  Apply cream or ointment only as directed by your health care provider. If using cream or ointment, wash the area with soap and water 2 times a day to remove all the cream or ointment. Rinse off the soap and pat the area dry with a clean towel.  Keep the wound area dry and clean. If the bandage becomes wet or dirty, or if it develops a bad smell, change it as soon as possible.  Continue to protect the wound from injury.  Use sunscreen when out in the sun. New scars become sunburned easily. Contact a health care provider if:  You have increasing redness, swelling, or pain in the wound.  You see pus coming from the wound.  You have a fever.  You notice a bad smell coming from the wound or dressing.  Your wound breaks open (edges not staying together). This information is not intended to replace advice given to you by your health care provider. Make sure you discuss any questions you have  with your health care provider. Document Released: 04/01/2001 Document Revised: 12/13/2015 Document Reviewed: 02/16/2013 Elsevier Interactive Patient Education  2017 Elsevier Inc.  

## 2017-03-10 NOTE — Progress Notes (Signed)
   Subjective:    Patient ID: Betty Hayes, female    DOB: 09-16-1985, 31 y.o.   MRN: [id number]   HPI Pt presents to the office today to have 6 sutures removed from left foot.  PT states she tripped over a towel rack and cut her foot on 02/28/17 and went to Urgent Care. Pt states she has intermittent aching pain of 1 out 10. Denies any erythemas, discharge, or fever.   Pt states she is unable to move her small toe and this is a new finding.    Review of Systems  Skin: Positive for wound.  All other systems reviewed and are negative.      Objective:   Physical Exam  Constitutional: She is oriented to person, place, and time. She appears well-developed and well-nourished. No distress.  HENT:  Head: Normocephalic.  Cardiovascular: Normal rate, regular rhythm, normal heart sounds and intact distal pulses.   No murmur heard. Pulmonary/Chest: Effort normal and breath sounds normal. No respiratory distress. She has no wheezes.  Abdominal: Soft. Bowel sounds are normal. She exhibits no distension. There is no tenderness.  Musculoskeletal: She exhibits tenderness. She exhibits no edema.  1 cm on lateral plantar foot, ot unable to flex or extend fifth metatarsal  Neurological: She is alert and oriented to person, place, and time.  Skin: Skin is warm and dry.  Psychiatric: She has a normal mood and affect. Her behavior is normal. Judgment and thought content normal.  Vitals reviewed.    BP 131/84   Pulse 79   Temp 99.6 F (37.6 C) (Oral)   Ht 5\' 7"  (1.702 m)   Wt 202 lb (91.6 kg)   BMI 31.64 kg/m      Assessment & Plan:  1. Laceration of left foot, subsequent encounter - mupirocin ointment (BACTROBAN) 2 %; Apply 1 application topically 2 (two) times daily.  Dispense: 30 g; Refill: 0 - Ambulatory referral to Orthopedic Surgery  2. Decreased ROM of left foot - Ambulatory referral to Orthopedic Surgery  Will send to ortho with decrease ROM Worrisome for tendon trauma? Report  any s/s of infection

## 2017-03-11 ENCOUNTER — Ambulatory Visit: Payer: Managed Care, Other (non HMO) | Admitting: Family Medicine

## 2017-03-16 ENCOUNTER — Encounter (INDEPENDENT_AMBULATORY_CARE_PROVIDER_SITE_OTHER): Payer: Self-pay | Admitting: Orthopaedic Surgery

## 2017-03-16 ENCOUNTER — Ambulatory Visit (INDEPENDENT_AMBULATORY_CARE_PROVIDER_SITE_OTHER): Payer: Managed Care, Other (non HMO)

## 2017-03-16 ENCOUNTER — Ambulatory Visit (INDEPENDENT_AMBULATORY_CARE_PROVIDER_SITE_OTHER): Payer: Managed Care, Other (non HMO) | Admitting: Orthopaedic Surgery

## 2017-03-16 DIAGNOSIS — M79675 Pain in left toe(s): Secondary | ICD-10-CM

## 2017-03-16 NOTE — Progress Notes (Signed)
Office Visit Note   Patient: Betty Hayes           Date of Birth: 27-Sep-1985           MRN: [id number] Visit Date: 03/16/2017              Requested by: Dettinger, Fransisca Kaufmann, MD [place], [place] [address] PCP: Dettinger, Fransisca Kaufmann, MD   Assessment & Plan: Visit Diagnoses:  1. Toe pain, left     Plan: From my standpoint the traumatic laceration has healed up nicely. Her x-rays don't show any evidence of foreign body or retained foreign material. Reassurance was given. Recommend orthotic as needed for support. Questions encouraged and answered. Follow-up as needed.  Follow-Up Instructions: Return if symptoms worsen or fail to improve.   Orders:  Orders Placed This Encounter  Procedures  . XR Foot Complete Left   No orders of the defined types were placed in this encounter.     Procedures: No procedures performed   Clinical Data: No additional findings.   Subjective: Chief Complaint  Patient presents with  . Left Foot - Pain    Patient is a healthy 31 year old female who injured the plantar aspect of her small toe about 2 weeks ago. She has a traumatic laceration which was closed in the ER. She now states that she feels like she is stepping on a rock or pebble. The stitches have been taken out. She denies any constitutional symptoms or drainage. She comes in today from referral by her primary care doctor. Denies any numbness and tingling.    Review of Systems  Constitutional: Negative.   HENT: Negative.   Eyes: Negative.   Respiratory: Negative.   Cardiovascular: Negative.   Endocrine: Negative.   Musculoskeletal: Negative.   Neurological: Negative.   Hematological: Negative.   Psychiatric/Behavioral: Negative.   All other systems reviewed and are negative.    Objective: Vital Signs: There were no vitals taken for this visit.  Physical Exam  Constitutional: She is oriented to person, place, and time. She appears well-developed and  well-nourished.  HENT:  Head: Normocephalic and atraumatic.  Eyes: EOM are normal.  Neck: Neck supple.  Pulmonary/Chest: Effort normal.  Abdominal: Soft.  Neurological: She is alert and oriented to person, place, and time.  Skin: Skin is warm. Capillary refill takes less than 2 seconds.  Psychiatric: She has a normal mood and affect. Her behavior is normal. Judgment and thought content normal.  Nursing note and vitals reviewed.   Ortho Exam Left small toe shows a healed traumatic laceration on the plantar aspect of the foot. There is some dry skin overlying the healed scar. There is no signs of infection. It is tender to palpation. She has difficulty flexing and extending the toe but this is also true of her other lesser toes likely secondary to pain and swelling. Specialty Comments:  No specialty comments available.  Imaging: Xr Foot Complete Left  Result Date: 03/16/2017 No acute or structural abnormalities    PMFS History: Patient Active Problem List   Diagnosis Date Noted  . Hashimoto's thyroiditis 05/22/2016  . Hypothyroidism 01/03/2016   Past Medical History:  Diagnosis Date  . Allergy   . Thyroid disease     Family History  Problem Relation Age of Onset  . Thrombocytopenia Mother        itp  . Heart disease Mother   . Hypertension Mother   . Seizures Brother   . ADD / ADHD Brother  Past Surgical History:  Procedure Laterality Date  . ABDOMINAL HYSTERECTOMY    . KIDNEY STONE SURGERY    . TONSILLECTOMY    . WISDOM TOOTH EXTRACTION     Social History   Occupational History  . Not on file.   Social History Main Topics  . Smoking status: Never Smoker  . Smokeless tobacco: Never Used  . Alcohol use Yes     Comment: occasional  . Drug use: No  . Sexual activity: Yes    Birth control/ protection: Surgical     Comment: married 14 years

## 2017-04-05 ENCOUNTER — Other Ambulatory Visit: Payer: Self-pay | Admitting: "Endocrinology

## 2017-05-29 ENCOUNTER — Encounter: Payer: Self-pay | Admitting: "Endocrinology

## 2017-05-29 ENCOUNTER — Ambulatory Visit: Payer: Managed Care, Other (non HMO) | Admitting: "Endocrinology

## 2017-11-23 ENCOUNTER — Other Ambulatory Visit: Payer: Self-pay | Admitting: "Endocrinology

## 2017-11-25 ENCOUNTER — Other Ambulatory Visit: Payer: Self-pay | Admitting: Family Medicine

## 2017-11-25 NOTE — Telephone Encounter (Signed)
Informed pt that Dr. Dorris Fetch refilled her medication yesterday

## 2017-12-04 ENCOUNTER — Ambulatory Visit: Payer: Managed Care, Other (non HMO) | Admitting: Family Medicine

## 2017-12-04 ENCOUNTER — Other Ambulatory Visit: Payer: Self-pay | Admitting: Family Medicine

## 2017-12-04 ENCOUNTER — Encounter: Payer: Self-pay | Admitting: Family Medicine

## 2017-12-04 ENCOUNTER — Ambulatory Visit (INDEPENDENT_AMBULATORY_CARE_PROVIDER_SITE_OTHER): Payer: Managed Care, Other (non HMO)

## 2017-12-04 VITALS — BP 119/82 | HR 72 | Temp 98.2°F | Ht 67.0 in | Wt 200.0 lb

## 2017-12-04 DIAGNOSIS — G8929 Other chronic pain: Secondary | ICD-10-CM

## 2017-12-04 DIAGNOSIS — R200 Anesthesia of skin: Secondary | ICD-10-CM | POA: Diagnosis not present

## 2017-12-04 DIAGNOSIS — E038 Other specified hypothyroidism: Secondary | ICD-10-CM | POA: Diagnosis not present

## 2017-12-04 DIAGNOSIS — M546 Pain in thoracic spine: Secondary | ICD-10-CM | POA: Diagnosis not present

## 2017-12-04 DIAGNOSIS — R202 Paresthesia of skin: Secondary | ICD-10-CM

## 2017-12-04 DIAGNOSIS — Z131 Encounter for screening for diabetes mellitus: Secondary | ICD-10-CM

## 2017-12-04 DIAGNOSIS — E669 Obesity, unspecified: Secondary | ICD-10-CM | POA: Diagnosis not present

## 2017-12-04 DIAGNOSIS — Z13220 Encounter for screening for lipoid disorders: Secondary | ICD-10-CM | POA: Diagnosis not present

## 2017-12-04 DIAGNOSIS — E063 Autoimmune thyroiditis: Secondary | ICD-10-CM | POA: Diagnosis not present

## 2017-12-04 NOTE — Progress Notes (Signed)
BP 119/82   Pulse 72   Temp 98.2 F (36.8 C) (Oral)   Ht '5\' 7"'  (1.702 m)   Wt 200 lb (90.7 kg)   BMI 31.32 kg/m    Subjective:    Patient ID: Betty Hayes, female    DOB: 06-15-1986, 33 y.o.   MRN: [id number]  HPI: Betty Hayes is a 32 y.o. female presenting on 12/04/2017 for Hypothyroidism (has not seen endocrinologist since last year, has not plans to see them again; patient can only take brand name) and 2nd opinion scoliosis (went to chiropractor and was told she had scoliosis; has noticed when she lays on right side she feels as if something is poking her in her left side)   HPI Hypothyroidism recheck Patient is coming in for thyroid recheck today as well. They deny any issues with hair changes or heat or cold problems or diarrhea or constipation. They deny any chest pain or palpitations. They are currently on levothyroxine 35mcrograms she has been having some tingling and numbness in both of her arms that she is attributed previously to thyroid disease but now she is concerned that she may be having something with a scoliosis her neck or back.  She says the numbness is very intermittent and only occurs with prolonged sitting and when her thyroid is out of control.  She has not had her thyroid checked in over a year because she has not seen an endocrinologist and does not plan to see them anymore.  Left rib pain and scoliosis Patient has been having left-sided rib pain when she lays on her right side and sometimes when she twists.  She feels like the pain is just under her ribs on that left side and only hurts with certain movements.  She had a chiropractor come to their office they did an x-ray on her and told her that she had scoliosis and she would like a second opinion to get evaluated for this  Relevant past medical, surgical, family and social history reviewed and updated as indicated. Interim medical history since our last visit reviewed. Allergies and medications reviewed and  updated.  Review of Systems  Constitutional: Negative for chills and fever.  Eyes: Negative for visual disturbance.  Respiratory: Negative for chest tightness and shortness of breath.   Cardiovascular: Negative for chest pain and leg swelling.  Endocrine: Negative for cold intolerance and heat intolerance.  Musculoskeletal: Positive for back pain. Negative for gait problem.  Skin: Negative for rash.  Neurological: Positive for numbness. Negative for weakness, light-headedness and headaches.  Psychiatric/Behavioral: Negative for agitation and behavioral problems.  All other systems reviewed and are negative.   Per HPI unless specifically indicated above   Allergies as of 12/04/2017      Reactions   Latex Rash      Medication List        Accurate as of 12/04/17  9:26 AM. Always use your most recent med list.          fexofenadine 180 MG tablet Commonly known as:  ALLEGRA Take 180 mg by mouth daily.   SYNTHROID 50 MCG tablet Generic drug:  levothyroxine TAKE 1 TABLET BY MOUTH EVERY DAY BEFORE BREAKFAST          Objective:    BP 119/82   Pulse 72   Temp 98.2 F (36.8 C) (Oral)   Ht '5\' 7"'  (1.702 m)   Wt 200 lb (90.7 kg)   BMI 31.32 kg/m   Wt Readings from Last  3 Encounters:  12/04/17 200 lb (90.7 kg)  03/10/17 202 lb (91.6 kg)  02/24/17 201 lb (91.2 kg)    Physical Exam  Constitutional: She is oriented to person, place, and time. She appears well-developed and well-nourished. No distress.  Eyes: Pupils are equal, round, and reactive to light. Conjunctivae and EOM are normal.  Neck: Neck supple. No thyromegaly present.  Cardiovascular: Normal rate, regular rhythm, normal heart sounds and intact distal pulses.  No murmur heard. Pulmonary/Chest: Effort normal and breath sounds normal. No respiratory distress. She has no wheezes.  Abdominal: Bowel sounds are normal. There is no hepatosplenomegaly. There is no rigidity, no rebound, no guarding and negative  Murphy's sign.    Musculoskeletal: Normal range of motion. She exhibits no edema or tenderness.  Lymphadenopathy:    She has no cervical adenopathy.  Neurological: She is alert and oriented to person, place, and time. Coordination normal.  Skin: Skin is warm and dry. No rash noted. She is not diaphoretic.  Psychiatric: She has a normal mood and affect. Her behavior is normal.  Nursing note and vitals reviewed.     Assessment & Plan:   Problem List Items Addressed This Visit      Endocrine   Hypothyroidism - Primary   Relevant Orders   Thyroid Panel With TSH   CBC with Differential/Platelet     Other   Obesity (BMI 30.0-34.9)   Relevant Orders   Amb ref to Medical Nutrition Therapy-MNT    Other Visit Diagnoses    Chronic left-sided thoracic back pain       Been going on for 6 months, was told by a chiropractor that she had scoliosis   Lipid screening       Relevant Orders   Lipid panel   Diabetes mellitus screening       Relevant Orders   CMP14+EGFR   Numbness and tingling       Relevant Orders   Vitamin B12      Has lateral left-sided back pain and was told she has scoliosis by a chiropractor.  She has the feeling that her arms fall asleep easier than previous but she also feels like sometimes is related to her thyroid. Referred to dietary for weight, recheck thyroid.  Follow up plan: Return in about 6 months (around 06/06/2018), or if symptoms worsen or fail to improve, for Patient needs a Pap smear as soon as she wants to, otherwise follow-up in 6 months.  Counseling provided for all of the vaccine components Orders Placed This Encounter  Procedures  . DG SCOLIOSIS EVAL COMPLETE SPINE 4 OR 5 VIEWS  . Thyroid Panel With TSH  . Lipid panel  . CMP14+EGFR  . CBC with Differential/Platelet  . Amb ref to [place], MD [place] 12/04/2017, 9:26 AM

## 2017-12-05 LAB — CMP14+EGFR
ALT: 15 IU/L (ref 0–32)
AST: 17 IU/L (ref 0–40)
Albumin/Globulin Ratio: 1.8 (ref 1.2–2.2)
Albumin: 4.7 g/dL (ref 3.5–5.5)
Alkaline Phosphatase: 72 IU/L (ref 39–117)
BUN/Creatinine Ratio: 13 (ref 9–23)
BUN: 10 mg/dL (ref 6–20)
Bilirubin Total: 0.9 mg/dL (ref 0.0–1.2)
CO2: 23 mmol/L (ref 20–29)
Calcium: 9.9 mg/dL (ref 8.7–10.2)
Chloride: 103 mmol/L (ref 96–106)
Creatinine, Ser: 0.8 mg/dL (ref 0.57–1.00)
GFR calc Af Amer: 111 mL/min/{1.73_m2} (ref >59)
GFR calc non Af Amer: 96 mL/min/{1.73_m2} (ref >59)
Globulin, Total: 2.6 g/dL (ref 1.5–4.5)
Glucose: 79 mg/dL (ref 65–99)
Potassium: 4.1 mmol/L (ref 3.5–5.2)
Sodium: 140 mmol/L (ref 134–144)
Total Protein: 7.3 g/dL (ref 6.0–8.5)

## 2017-12-05 LAB — CBC WITH DIFFERENTIAL/PLATELET
Basophils Absolute: 0 10*3/uL (ref 0.0–0.2)
Basos: 1 %
EOS (ABSOLUTE): 0.2 10*3/uL (ref 0.0–0.4)
Eos: 3 %
Hematocrit: 37.8 % (ref 34.0–46.6)
Hemoglobin: 13.1 g/dL (ref 11.1–15.9)
Immature Grans (Abs): 0 10*3/uL (ref 0.0–0.1)
Immature Granulocytes: 0 %
Lymphocytes Absolute: 2.3 10*3/uL (ref 0.7–3.1)
Lymphs: 35 %
MCH: 28.2 pg (ref 26.6–33.0)
MCHC: 34.7 g/dL (ref 31.5–35.7)
MCV: 82 fL (ref 79–97)
Monocytes Absolute: 0.4 10*3/uL (ref 0.1–0.9)
Monocytes: 7 %
Neutrophils Absolute: 3.6 10*3/uL (ref 1.4–7.0)
Neutrophils: 54 %
Platelets: 202 10*3/uL (ref 150–379)
RBC: 4.64 x10E6/uL (ref 3.77–5.28)
RDW: 13.3 % (ref 12.3–15.4)
WBC: 6.5 10*3/uL (ref 3.4–10.8)

## 2017-12-05 LAB — LIPID PANEL
Chol/HDL Ratio: 2.9 ratio (ref 0.0–4.4)
Cholesterol, Total: 174 mg/dL (ref 100–199)
HDL: 60 mg/dL (ref >39)
LDL Calculated: 97 mg/dL (ref 0–99)
Triglycerides: 83 mg/dL (ref 0–149)
VLDL Cholesterol Cal: 17 mg/dL (ref 5–40)

## 2017-12-05 LAB — THYROID PANEL WITH TSH
Free Thyroxine Index: 1.9 (ref 1.2–4.9)
T3 Uptake Ratio: 24 % (ref 24–39)
T4, Total: 7.9 ug/dL (ref 4.5–12.0)
TSH: 3.72 u[IU]/mL (ref 0.450–4.500)

## 2017-12-05 LAB — VITAMIN B12: Vitamin B-12: 377 pg/mL (ref 232–1245)

## 2018-04-01 ENCOUNTER — Telehealth: Payer: Self-pay | Admitting: Family Medicine

## 2018-04-01 DIAGNOSIS — L700 Acne vulgaris: Secondary | ICD-10-CM

## 2018-04-01 NOTE — Telephone Encounter (Signed)
Patient states that she was given spirolactone when she was living in [place] and she is requesting for a new prescription. Patient states it was given to her for acne. Please advise

## 2018-04-01 NOTE — Telephone Encounter (Signed)
Spironolactone can be used for this but oftentimes has a lot of side effects and things we have to monitor, I would like to discuss these in a visit with her to have her come in and we can discuss this briefly and do some baseline labs for it.

## 2018-04-02 MED ORDER — SPIRONOLACTONE 25 MG PO TABS: 25.0000 mg | ORAL_TABLET | Freq: Every day | ORAL | 1 refills | Status: DC

## 2018-04-02 NOTE — Telephone Encounter (Signed)
Patient aware and states she knows the side effects. Patient just wants lab orders put in so she does not have to come in to see the doctor. Patient aware of Dettingers recommendations. Still just wants to do the labs or needs to be seen sooner than 10/03 which is the first appt front can make. Please advise.

## 2018-04-02 NOTE — Telephone Encounter (Signed)
Patient aware and verbalized understanding. °

## 2018-04-02 NOTE — Telephone Encounter (Signed)
lmtcb

## 2018-04-02 NOTE — Telephone Encounter (Signed)
Please let patient know that I sent in spironolactone, I want her to come in and do the blood draw 2 weeks after starting spironolactone

## 2018-04-22 ENCOUNTER — Other Ambulatory Visit: Payer: Managed Care, Other (non HMO)

## 2018-04-22 DIAGNOSIS — L700 Acne vulgaris: Secondary | ICD-10-CM

## 2018-04-23 LAB — BMP8+EGFR
BUN/Creatinine Ratio: 12 (ref 9–23)
BUN: 8 mg/dL (ref 6–20)
CO2: 24 mmol/L (ref 20–29)
Calcium: 9.3 mg/dL (ref 8.7–10.2)
Chloride: 104 mmol/L (ref 96–106)
Creatinine, Ser: 0.68 mg/dL (ref 0.57–1.00)
GFR calc Af Amer: 132 mL/min/{1.73_m2} (ref >59)
GFR calc non Af Amer: 114 mL/min/{1.73_m2} (ref >59)
Glucose: 84 mg/dL (ref 65–99)
Potassium: 4.5 mmol/L (ref 3.5–5.2)
Sodium: 140 mmol/L (ref 134–144)

## 2018-05-01 ENCOUNTER — Ambulatory Visit (INDEPENDENT_AMBULATORY_CARE_PROVIDER_SITE_OTHER): Payer: Managed Care, Other (non HMO) | Admitting: Nurse Practitioner

## 2018-05-01 ENCOUNTER — Encounter: Payer: Self-pay | Admitting: Nurse Practitioner

## 2018-05-01 VITALS — BP 115/72 | HR 73 | Temp 97.4°F | Ht 67.0 in | Wt 203.4 lb

## 2018-05-01 DIAGNOSIS — H9201 Otalgia, right ear: Secondary | ICD-10-CM

## 2018-05-01 NOTE — Progress Notes (Signed)
   Subjective:    Patient ID: Betty Hayes, female    DOB: May 19, 1986, 32 y.o.   MRN: [id number]   Chief Complaint: Ear Pain (right )   HPI Patient comes in today c/o right ear pain. Started in the midle of night. No drainage.    Review of Systems  Constitutional: Negative for chills and fever.  HENT: Positive for ear pain. Negative for congestion, sinus pain, sore throat and trouble swallowing.   Respiratory: Negative for cough.   Cardiovascular: Negative.   Gastrointestinal: Negative.   Genitourinary: Negative.   Neurological: Negative.   Psychiatric/Behavioral: Negative.   All other systems reviewed and are negative.      Objective:   Physical Exam  Constitutional: She is oriented to person, place, and time. She appears well-developed and well-nourished. No distress.  HENT:  Right Ear: Hearing, tympanic membrane, external ear and ear canal normal.  Left Ear: Hearing, tympanic membrane, external ear and ear canal normal.  Nose: Nose normal.  Mouth/Throat: Uvula is midline and oropharynx is clear and moist.  Cardiovascular: Normal rate and regular rhythm.  Pulmonary/Chest: Effort normal and breath sounds normal.  Neurological: She is alert and oriented to person, place, and time.  Skin: Skin is warm.  Psychiatric: She has a normal mood and affect. Her behavior is normal. Thought content normal.    BP 115/72   Pulse 73   Temp (!) 97.4 F (36.3 C) (Oral)   Ht 5\' 7"  (1.702 m)   Wt 203 lb 6.4 oz (92.3 kg)   BMI 31.86 kg/m        Assessment & Plan:  Betty Hayes in today with chief complaint of Ear Pain (right )   1. Right ear pain Moist heat to ear Motrin 600mg  e6 hours OTC rto prn  Mary-Margaret Hassell Done, FNP

## 2018-05-01 NOTE — Patient Instructions (Signed)

## 2018-05-28 ENCOUNTER — Other Ambulatory Visit: Payer: Self-pay | Admitting: Family Medicine

## 2018-06-15 ENCOUNTER — Telehealth: Payer: Self-pay | Admitting: *Deleted

## 2018-06-18 NOTE — Telephone Encounter (Signed)
Needs appt for flu shot.

## 2018-06-21 ENCOUNTER — Other Ambulatory Visit: Payer: Self-pay | Admitting: Family Medicine

## 2018-06-21 ENCOUNTER — Other Ambulatory Visit: Payer: Self-pay | Admitting: "Endocrinology

## 2018-06-30 ENCOUNTER — Ambulatory Visit (INDEPENDENT_AMBULATORY_CARE_PROVIDER_SITE_OTHER): Payer: Managed Care, Other (non HMO) | Admitting: Family Medicine

## 2018-06-30 ENCOUNTER — Encounter: Payer: Self-pay | Admitting: Family Medicine

## 2018-06-30 VITALS — BP 110/72 | HR 67 | Temp 97.3°F | Ht 67.0 in | Wt 207.6 lb

## 2018-06-30 DIAGNOSIS — E669 Obesity, unspecified: Secondary | ICD-10-CM | POA: Diagnosis not present

## 2018-06-30 DIAGNOSIS — L700 Acne vulgaris: Secondary | ICD-10-CM | POA: Diagnosis not present

## 2018-06-30 DIAGNOSIS — E063 Autoimmune thyroiditis: Secondary | ICD-10-CM

## 2018-06-30 DIAGNOSIS — E038 Other specified hypothyroidism: Secondary | ICD-10-CM

## 2018-06-30 MED ORDER — SPIRONOLACTONE 50 MG PO TABS: 50.0000 mg | ORAL_TABLET | Freq: Every day | ORAL | 5 refills | Status: DC

## 2018-06-30 NOTE — Progress Notes (Signed)
BP 110/72   Pulse 67   Temp (!) 97.3 F (36.3 C) (Oral)   Ht '5\' 7"'  (1.702 m)   Wt 207 lb 9.6 oz (94.2 kg)   BMI 32.51 kg/m    Subjective:    Patient ID: Betty Hayes, female    DOB: 25-Oct-1985, 32 y.o.   MRN: [id number]  HPI: Betty Hayes is a 32 y.o. female presenting on 06/30/2018 for Medical Management of Chronic Issues   HPI Hypothyroidism recheck Patient is coming in for thyroid recheck today as well. They deny any issues with hair changes or heat or cold problems or diarrhea or constipation. They deny any chest pain or palpitations. They are currently on levothyroxine 60mcrograms, she has been out of it for the past few days.  She says she is been having a lot of fatigue over the past few months, she did change work shift hours where she is working second shift and that has been affecting her sleep but she says it is not just that and she thinks her thyroid is off as well.  Acne recheck Patient is coming in for acne recheck and she started the spironolactone.  She said back in today with a dermatologist she is to be on a higher dose and she says this dose is just not working for her acne is still very prevalent.  We will recheck her potassium today and is on as her potassium is good then we will consider increasing for her.  Her acne continues to be mostly on her face  Patient was given a short on obesity and weight is up a few pounds from before.  Recommended reducing caloric intake.  Relevant past medical, surgical, family and social history reviewed and updated as indicated. Interim medical history since our last visit reviewed. Allergies and medications reviewed and updated.  Review of Systems  Constitutional: Positive for fatigue. Negative for chills and fever.  Eyes: Negative for visual disturbance.  Respiratory: Negative for chest tightness and shortness of breath.   Cardiovascular: Negative for chest pain and leg swelling.  Musculoskeletal: Negative for back pain  and gait problem.  Skin: Negative for rash.  Neurological: Negative for dizziness, weakness, light-headedness, numbness and headaches.  Psychiatric/Behavioral: Negative for agitation, behavioral problems, decreased concentration and dysphoric mood. The patient is not nervous/anxious.   All other systems reviewed and are negative.   Per HPI unless specifically indicated above   Allergies as of 06/30/2018      Reactions   Latex Rash      Medication List        Accurate as of 06/30/18  9:23 AM. Always use your most recent med list.          fexofenadine 180 MG tablet Commonly known as:  ALLEGRA Take 180 mg by mouth daily.   spironolactone 50 MG tablet Commonly known as:  ALDACTONE Take 1 tablet (50 mg total) by mouth daily. (Needs to be seen before next refill)   SYNTHROID 50 MCG tablet Generic drug:  levothyroxine TAKE 1 TABLET BY MOUTH EVERY DAY BEFORE BREAKFAST          Objective:    BP 110/72   Pulse 67   Temp (!) 97.3 F (36.3 C) (Oral)   Ht '5\' 7"'  (1.702 m)   Wt 207 lb 9.6 oz (94.2 kg)   BMI 32.51 kg/m   Wt Readings from Last 3 Encounters:  06/30/18 207 lb 9.6 oz (94.2 kg)  05/01/18 203 lb 6.4 oz (92.3  kg)  12/04/17 200 lb (90.7 kg)    Physical Exam  Constitutional: She is oriented to person, place, and time. She appears well-developed and well-nourished. No distress.  Eyes: Conjunctivae are normal.  Neck: Neck supple. No thyromegaly present.  Cardiovascular: Normal rate, regular rhythm, normal heart sounds and intact distal pulses.  No murmur heard. Pulmonary/Chest: Effort normal and breath sounds normal. No respiratory distress. She has no wheezes.  Musculoskeletal: Normal range of motion. She exhibits no edema or tenderness.  Lymphadenopathy:    She has no cervical adenopathy.  Neurological: She is alert and oriented to person, place, and time. Coordination normal.  Skin: Skin is warm and dry. No rash noted. She is not diaphoretic.  Psychiatric:  She has a normal mood and affect. Her behavior is normal.  Nursing note and vitals reviewed.       Assessment & Plan:   Problem List Items Addressed This Visit      Endocrine   Hypothyroidism - Primary   Relevant Orders   TSH   BMP8+EGFR   Ambulatory referral to Endocrinology     Other   Obesity (BMI 30.0-34.9)    Other Visit Diagnoses    Acne vulgaris       Relevant Medications   spironolactone (ALDACTONE) 50 MG tablet   Other Relevant Orders   BMP8+EGFR     Increase spironolactone if potassium is still normal.  We will check thyroid and then send her a dose of levothyroxine of what dose she needs based on the labs.  Follow up plan: Return in about 3 months (around 09/29/2018), or if symptoms worsen or fail to improve, for Recheck acne and thyroid.  Counseling provided for all of the vaccine components Orders Placed This Encounter  Procedures  . TSH  . BMP8+EGFR  . Ambulatory referral to Endocrinology    Caryl Pina, MD [facility] 06/30/2018, 9:23 AM

## 2018-07-01 ENCOUNTER — Other Ambulatory Visit: Payer: Self-pay | Admitting: *Deleted

## 2018-07-01 LAB — BMP8+EGFR
BUN/Creatinine Ratio: 13 (ref 9–23)
BUN: 10 mg/dL (ref 6–20)
CO2: 22 mmol/L (ref 20–29)
Calcium: 9.2 mg/dL (ref 8.7–10.2)
Chloride: 105 mmol/L (ref 96–106)
Creatinine, Ser: 0.78 mg/dL (ref 0.57–1.00)
GFR calc Af Amer: 115 mL/min/{1.73_m2} (ref >59)
GFR calc non Af Amer: 99 mL/min/{1.73_m2} (ref >59)
Glucose: 110 mg/dL — ABNORMAL HIGH (ref 65–99)
Potassium: 4.2 mmol/L (ref 3.5–5.2)
Sodium: 140 mmol/L (ref 134–144)

## 2018-07-01 LAB — TSH: TSH: 4.84 u[IU]/mL — ABNORMAL HIGH (ref 0.450–4.500)

## 2018-07-01 MED ORDER — LEVOTHYROXINE SODIUM 75 MCG PO TABS: 75.0000 ug | ORAL_TABLET | Freq: Every day | ORAL | 1 refills | Status: DC

## 2018-07-21 DIAGNOSIS — E041 Nontoxic single thyroid nodule: Secondary | ICD-10-CM

## 2018-07-21 HISTORY — DX: Nontoxic single thyroid nodule: E04.1

## 2018-08-31 DIAGNOSIS — E063 Autoimmune thyroiditis: Secondary | ICD-10-CM | POA: Diagnosis not present

## 2018-08-31 DIAGNOSIS — E039 Hypothyroidism, unspecified: Secondary | ICD-10-CM | POA: Diagnosis not present

## 2018-09-29 ENCOUNTER — Ambulatory Visit: Payer: Managed Care, Other (non HMO) | Admitting: Family Medicine

## 2018-12-24 ENCOUNTER — Other Ambulatory Visit: Payer: Self-pay | Admitting: Family Medicine

## 2018-12-24 NOTE — Telephone Encounter (Signed)
Last seen 06/30/18, cancelled 09/29/18 appt. 30 day supply given no refills needs appt.

## 2019-01-25 ENCOUNTER — Other Ambulatory Visit: Payer: Self-pay | Admitting: Family Medicine

## 2019-01-25 NOTE — Telephone Encounter (Signed)
ntbs for refill, 30 day supply was sent 12/24/18.

## 2019-01-25 NOTE — Telephone Encounter (Signed)
Aware. 

## 2019-02-18 ENCOUNTER — Ambulatory Visit (INDEPENDENT_AMBULATORY_CARE_PROVIDER_SITE_OTHER): Payer: BC Managed Care – PPO | Admitting: Nurse Practitioner

## 2019-02-18 DIAGNOSIS — K219 Gastro-esophageal reflux disease without esophagitis: Secondary | ICD-10-CM | POA: Diagnosis not present

## 2019-02-18 DIAGNOSIS — R1312 Dysphagia, oropharyngeal phase: Secondary | ICD-10-CM

## 2019-02-18 MED ORDER — PANTOPRAZOLE SODIUM 40 MG PO TBEC: 40.0000 mg | DELAYED_RELEASE_TABLET | Freq: Every day | ORAL | 3 refills | Status: DC

## 2019-02-18 NOTE — Progress Notes (Signed)
Virtual Visit via telephone Note Due to COVID-19 pandemic this visit was conducted virtually. This visit type was conducted due to national recommendations for restrictions regarding the COVID-19 Pandemic (e.g. social distancing, sheltering in place) in an effort to limit this patient's exposure and mitigate transmission in our community. All issues noted in this document were discussed and addressed.  A physical exam was not performed with this format.  I connected with Betty Hayes on 02/18/19 at 1:45 by telephone and verified that I am speaking with the correct person using two identifiers. Betty Hayes is currently located at work and no one is currently with her during visit. The provider, Mary-Margaret Hassell Done, FNP is located in their office at time of visit.  I discussed the limitations, risks, security and privacy concerns of performing an evaluation and management service by telephone and the availability of in person appointments. I also discussed with the patient that there may be a patient responsible charge related to this service. The patient expressed understanding and agreed to proceed.  * attempted to call patient at 11:50- no answer- left message  History and Present Illness:   Chief Complaint: Sore Throat   HPI Patient calls in c/o trouble swallowing food. She says she can swallow liquids just fine but solid foods have become difficult to swallow. She says she has tried taking smaller bites and still having problems. Has been happening intermittently for several months but now it is happening more often. Also has had lots of reflux lately and tums usually helps but has not been helping.   Review of Systems  Constitutional: Negative for diaphoresis and weight loss.  Eyes: Negative for blurred vision, double vision and pain.  Respiratory: Negative for shortness of breath.   Cardiovascular: Negative for chest pain, palpitations, orthopnea and leg swelling.   Gastrointestinal: Negative for abdominal pain.  Skin: Negative for rash.  Neurological: Negative for dizziness, sensory change, loss of consciousness, weakness and headaches.  Endo/Heme/Allergies: Negative for polydipsia. Does not bruise/bleed easily.  Psychiatric/Behavioral: Negative for memory loss. The patient does not have insomnia.   All other systems reviewed and are negative.    Observations/Objective: Alert and oriented No distress noted during conversation.  Assessment and Plan: Betty Hayes in today with chief complaint of Sore Throat   1. Oropharyngeal dysphagia continue  To chew foods well - Ambulatory referral to Gastroenterology  2. Gastroesophageal reflux disease without esophagitis Avoid spicy foods Do not eat 2 hours prior to bedtime - pantoprazole (PROTONIX) 40 MG tablet; Take 1 tablet (40 mg total) by mouth daily.  Dispense: 30 tablet; Refill: 3   Follow Up Instructions: Prn     I discussed the assessment and treatment plan with the patient. The patient was provided an opportunity to ask questions and all were answered. The patient agreed with the plan and demonstrated an understanding of the instructions.   The patient was advised to call back or seek an in-person evaluation if the symptoms worsen or if the condition fails to improve as anticipated.  The above assessment and management plan was discussed with the patient. The patient verbalized understanding of and has agreed to the management plan. Patient is aware to call the clinic if symptoms persist or worsen. Patient is aware when to return to the clinic for a follow-up visit. Patient educated on when it is appropriate to go to the emergency department.   Time call ended:  1:55 I provided 10 minutes of non-face-to-face time during this encounter.  Mary-Margaret Orit Sanville, FNP   

## 2019-02-23 ENCOUNTER — Encounter: Payer: Self-pay | Admitting: Internal Medicine

## 2019-03-29 DIAGNOSIS — R1310 Dysphagia, unspecified: Secondary | ICD-10-CM | POA: Insufficient documentation

## 2019-03-29 NOTE — Progress Notes (Signed)
Referring Provider: Chevis Pretty, FNP Primary Care Physician:  Dettinger, Fransisca Kaufmann, MD Primary Gastroenterologist:  Dr. Gala Romney  Chief Complaint  Patient presents with  . Dysphagia    left side of throat swollen; has thryroid problem also  . Gastroesophageal Reflux    HPI:   Caralyn Becket is a 33 y.o. female presenting today at the request of Chevis Pretty, FNP for dysphagia. Reviewed recent PCP note. Patient saw Mary-Margaret Hassell Done, FNP on 7/31 with cc of difficulty swallowing solid foods that has been getting worse. Also with worsening reflux. She was started on Protonix 40 mg daily and referred to GI for further evaluation.   Today she states dysphagia started around end of July. States she has always had acid reflux. The week before she started having trouble swallowing, she was having reflux all day every day no matter what she did. Would also be in the middle of the night. She is now having trouble swallowing solid foods and large pills. Has been having shakes and soups. Has not had any meat other than canned tuna. Nothing dry will want to go down. States, "It's like I am having a hard time getting food to the back my throat and when I swallow, it wants to hang in the back of my throat unless I drink a lot of water." Doesn't feel like it is getting stuck in her esophagus. Reports the left side of her throat is "swollen" on the inside. She does have a history of hypothyroidism. She has not seen PCP about swelling in her throat. Saw her endocrinologists around the end of last year or beginning of this year who told her the left side of her throat was slightly swollen but they were not concerned at that time as she was having no trouble. She hasn't really paid much attention to it until now.   Since starting Protonix, her acid reflux and heartburn have been under much better control. May have some breakthrough once a week when she burps. Has been avoiding spicy foods. Minimal  fried fatty foods.  Occasional tylenol or ibuprofen. No goody powders. No nausea or vomiting. No abdominal pain. No lower GI symptoms. No brbpr or melena.  No fever or chills, Lightneadedness or dizziness. No sore throat, nasal congestion, post nasal drip, SOB, or cough. No chest pain or palpitations.  Admits to a patch of redness that appeared on her chest for a few days then the back of her left arm for a few days after starting Protonix, but these have resolved. rash on her chest then the back of her left arm.    2-3 drinks of alcohol on the weekend.   Past Medical History:  Diagnosis Date  . Allergy   . Thyroid disease    Hypothyroidism    Past Surgical History:  Procedure Laterality Date  . ABDOMINAL HYSTERECTOMY    . KIDNEY STONE SURGERY    . TONSILLECTOMY     Around age 20  . WISDOM TOOTH EXTRACTION      Current Outpatient Medications  Medication Sig Dispense Refill  . fexofenadine (ALLEGRA) 180 MG tablet Take 180 mg by mouth as needed.     . pantoprazole (PROTONIX) 40 MG tablet Take 1 tablet (40 mg total) by mouth daily. 30 tablet 3  . SYNTHROID 75 MCG tablet TAKE 1 TABLET BY MOUTH EVERY DAY 30 tablet 0   No current facility-administered medications for this visit.     Allergies as of 03/31/2019 - Review Complete 03/31/2019  Allergen Reaction Noted  . Latex Rash 01/03/2016    Family History  Problem Relation Age of Onset  . Thrombocytopenia Mother        itp  . Heart disease Mother   . Hypertension Mother   . Seizures Brother   . ADD / ADHD Brother   . Colon cancer Neg Hx   . Colon polyps Neg Hx     Social History   Socioeconomic History  . Marital status: Married    Spouse name: Not on file  . Number of children: Not on file  . Years of education: Not on file  . Highest education level: Not on file  Occupational History  . Not on file  Social Needs  . Financial resource strain: Not on file  . Food insecurity    Worry: Not on file    Inability:  Not on file  . Transportation needs    Medical: Not on file    Non-medical: Not on file  Tobacco Use  . Smoking status: Never Smoker  . Smokeless tobacco: Never Used  Substance and Sexual Activity  . Alcohol use: Yes    Comment: occasional  . Drug use: No  . Sexual activity: Yes    Birth control/protection: Surgical    Comment: married 14 years  Lifestyle  . Physical activity    Days per week: Not on file    Minutes per session: Not on file  . Stress: Not on file  Relationships  . Social Herbalist on phone: Not on file    Gets together: Not on file    Attends religious service: Not on file    Active member of club or organization: Not on file    Attends meetings of clubs or organizations: Not on file    Relationship status: Not on file  . Intimate partner violence    Fear of current or ex partner: Not on file    Emotionally abused: Not on file    Physically abused: Not on file    Forced sexual activity: Not on file  Other Topics Concern  . Not on file  Social History Narrative  . Not on file    Review of Systems: Gen: See HPI CV: See HPI Resp: See HPI  GI: See HPI GU: Denies urinary burning, urinary frequency, urinary hesitancy MS: Denies joint pain, muscle weakness  Derm: See HPI  Psych: Denies depression, anxiety Heme: Denies bruising, bleeding  Physical Exam: BP 130/87   Pulse 73   Temp (!) 97.1 F (36.2 C) (Temporal)   Ht 5\' 7"  (1.702 m)   Wt 211 lb 3.2 oz (95.8 kg)   BMI 33.08 kg/m  General:   Alert and oriented. Pleasant and cooperative. Well-nourished and well-developed.  Head:  Normocephalic and atraumatic. Eyes:  Without icterus, sclera clear and conjunctiva pink.  Ears:  Normal auditory acuity. Nose:  No deformity, discharge,  or lesions. Mouth:  Some asymmetry of the palatopharyngeal arch on the left side. No erythema, white patches, or edema. Uvula is midline. As this is my first time seeing the patient, I am not sure if this just  her normal anatomy, or if this is a change.   Neck:  Supple, without mass or thyromegaly. No abnormalities felt during swallowing. Prominent submandibular lymph nodes, but this was symmetrical and non-tender. Lungs:  Clear to auscultation bilaterally. No wheezes, rales, or rhonchi. No distress.  Heart:  S1, S2 present without murmurs appreciated.  Abdomen:  +  BS, soft, non-tender and non-distended. No HSM noted. No guarding or rebound. No masses appreciated.  Rectal:  Deferred  Msk:  Symmetrical without gross deformities. Normal posture. Extremities:  Without edema. Neurologic:  Alert and  oriented x4;  grossly normal neurologically. Skin:  Intact without significant lesions or rashes. Cervical Nodes:  No significant cervical adenopathy.  Psych:  Normal mood and affect.

## 2019-03-31 ENCOUNTER — Other Ambulatory Visit: Payer: Self-pay

## 2019-03-31 ENCOUNTER — Encounter: Payer: Self-pay | Admitting: Internal Medicine

## 2019-03-31 ENCOUNTER — Encounter: Payer: Self-pay | Admitting: Gastroenterology

## 2019-03-31 ENCOUNTER — Ambulatory Visit (INDEPENDENT_AMBULATORY_CARE_PROVIDER_SITE_OTHER): Payer: BC Managed Care – PPO | Admitting: Gastroenterology

## 2019-03-31 ENCOUNTER — Encounter: Payer: Self-pay | Admitting: *Deleted

## 2019-03-31 DIAGNOSIS — R1310 Dysphagia, unspecified: Secondary | ICD-10-CM

## 2019-03-31 DIAGNOSIS — K219 Gastro-esophageal reflux disease without esophagitis: Secondary | ICD-10-CM | POA: Diagnosis not present

## 2019-03-31 NOTE — Patient Instructions (Addendum)
We will get you scheduled for barium pill esophagram in the near future. They should call you with a time and date.   Please follow-up with PCP on asymmetry you are having in the back of your throat. This may be contributing to your symptoms.   Continue taking Protonix 40 mg daily 30 minutes before breakfast.   Continue soft diet with protein shakes.    See GERD handout below.   We will plan to see you back in 4-6 weeks.   Aliene Altes, PA-C [facility]

## 2019-03-31 NOTE — Assessment & Plan Note (Addendum)
33 year old female with past medical history of hypothyroidism secondary to Hashimoto's who presents with new onset of dysphasia to solid foods and large pills x2 months and is following a soft diet at this time.  Symptoms sound more oropharyngeal with patient stating she feels she has trouble getting food to the back of her throat, and when swallowing, feels like it hangs in the back of her throat unless she drinks a lot of water.  No symptoms of food hanging in her esophagus. She has a long history of reflux and reported worsening of GERD symptoms for about 1 week prior to the onset of dysphasia.  Also reports swelling in the back of her throat on the left side, which she says her Endocrinologists has pointed out in the past but she has never paid attention to it until now. Denies sore throat, nasal congestion or postnasal drip. GERD symptoms are improved on Protonix 40 mg daily which she has been on since the beginning of August.  Denies abdominal pain, nausea, vomiting, bright red blood per rectum, or melena.  On exam she does have some asymmetry of the palatopharyngeal arch on the left side.  No erythema, white patches or edema.  Uvula is midline.  Neck is without masses or thyromegaly.  There is some prominence of the submandibular lymph nodes but this is symmetrical and they are nontender.  Abdominal exam is benign.  As symptoms seem more oropharyngeal vs esophageal web/stricture formation from chronic GERD and in light of patient's history of hypothyroidism and asymmetry of her throat on exam, I will start with obtaining a barium pill esophagram to evaluate her swallowing. Ultimately, she may need EGD. Continue following a soft diet with protein shakes.  Continue Protonix 40 mg daily 30 minutes before breakfast for control of GERD.  Advised she follow GERD diet. Handout provided.  Follow-up with PCP on palatopharyngeal arch asymmetry. Follow-up in our office in 4-6 weeks.

## 2019-03-31 NOTE — Assessment & Plan Note (Signed)
Addressed under dysphagia. 

## 2019-04-04 ENCOUNTER — Other Ambulatory Visit: Payer: Self-pay

## 2019-04-04 ENCOUNTER — Ambulatory Visit (HOSPITAL_COMMUNITY)
Admission: RE | Admit: 2019-04-04 | Discharge: 2019-04-04 | Disposition: A | Discharge status: Home / Self Care | Payer: BC Managed Care – PPO | Source: Ambulatory Visit | Attending: Gastroenterology | Admitting: Gastroenterology

## 2019-04-04 DIAGNOSIS — K219 Gastro-esophageal reflux disease without esophagitis: Secondary | ICD-10-CM | POA: Diagnosis not present

## 2019-04-04 DIAGNOSIS — R1310 Dysphagia, unspecified: Secondary | ICD-10-CM | POA: Insufficient documentation

## 2019-04-05 ENCOUNTER — Ambulatory Visit (INDEPENDENT_AMBULATORY_CARE_PROVIDER_SITE_OTHER): Payer: BC Managed Care – PPO | Admitting: Nurse Practitioner

## 2019-04-05 ENCOUNTER — Encounter: Payer: Self-pay | Admitting: Nurse Practitioner

## 2019-04-05 VITALS — BP 129/86 | HR 62 | Temp 97.7°F | Resp 16 | Ht 67.0 in | Wt 210.0 lb

## 2019-04-05 DIAGNOSIS — E063 Autoimmune thyroiditis: Secondary | ICD-10-CM

## 2019-04-05 DIAGNOSIS — R1313 Dysphagia, pharyngeal phase: Secondary | ICD-10-CM

## 2019-04-05 DIAGNOSIS — E038 Other specified hypothyroidism: Secondary | ICD-10-CM

## 2019-04-05 DIAGNOSIS — E039 Hypothyroidism, unspecified: Secondary | ICD-10-CM | POA: Diagnosis not present

## 2019-04-05 NOTE — Progress Notes (Signed)
   Subjective:    Patient ID: Betty Hayes, female    DOB: March 09, 1986, 33 y.o.   MRN: [id number]   Chief Complaint: Recheck throat (Saw GI for swallowing problems and they referred her back to [place])   HPI Patient come sin to discuss her throat. She had telephone visit on 02/18/19 with c/o of dysphagia. At that time she was having trouble swallowing solids but no problem swallowing liquids. She was referred to GI. She was sent for barium swallow test yesterday but does not have results yet. In chart the result showed acid reflux and no other issues. No stricture seen. She is on protonix daily. GI seems to think that she needs to see ENT because left side looks "different" then right. She has appointment with endocrinology next week. she was told in past that left side of thyroid is larger then right.   Review of Systems  Constitutional: Negative for activity change and appetite change.  HENT: Negative.   Eyes: Negative for pain.  Respiratory: Negative for shortness of breath.   Cardiovascular: Negative for chest pain, palpitations and leg swelling.  Gastrointestinal: Negative for abdominal pain.  Endocrine: Negative for polydipsia.  Genitourinary: Negative.   Skin: Negative for rash.  Neurological: Negative for dizziness, weakness and headaches.  Hematological: Does not bruise/bleed easily.  Psychiatric/Behavioral: Negative.   All other systems reviewed and are negative.      Objective:   Physical Exam Vitals signs and nursing note reviewed.  Constitutional:      Appearance: Normal appearance.  HENT:     Mouth/Throat:     Pharynx: Oropharynx is clear. Uvula midline.     Tonsils: No tonsillar exudate or tonsillar abscesses. 0 on the right. 0 on the left.  Cardiovascular:     Rate and Rhythm: Normal rate and regular rhythm.     Heart sounds: Normal heart sounds.  Pulmonary:     Effort: Pulmonary effort is normal.     Breath sounds: Normal breath sounds.  Abdominal:     General:  Abdomen is flat. Bowel sounds are normal.  Skin:    General: Skin is warm and dry.  Neurological:     General: No focal deficit present.     Mental Status: She is alert and oriented to person, place, and time.  Psychiatric:        Mood and Affect: Mood normal.        Behavior: Behavior normal.     BP 129/86   Pulse 62   Temp 97.7 F (36.5 C) (Oral)   Resp 16   Ht 5\' 7"  (1.702 m)   Wt 210 lb (95.3 kg)   SpO2 99%   BMI 32.89 kg/m        Assessment & Plan:  [name] in today with chief complaint of Recheck throat (Saw GI for swallowing problems and they referred her back to [place])   1. Pharyngeal dysphagia Keep appointment with endocrinologist Will do ENT referral if needed after get U/S results - US Soft Tissue Head/Neck; Future  Mary-Margaret Hassell Done, FNP

## 2019-04-05 NOTE — Patient Instructions (Signed)
Dysphagia Eating Plan, Bite Size Food This diet plan is for people with moderate swallowing problems who have transitioned from pureed and minced foods. Bite size foods are soft and cut into small chunks so that they can be swallowed safely. On this eating plan, you may be instructed to drink liquids that are thickened. Work with your health care provider and your diet and nutrition specialist (dietitian) to make sure that you are following the diet safely and getting all the nutrients you need. What are tips for following this plan? General guidelines for foods   You may eat foods that are tender, soft, and moist.  Always test food texture before taking a bite. Poke food with a fork or spoon to make sure it is tender.  Food should be easy to cut and shew. Avoid large pieces of food that require a lot of chewing.  Take small bites. Each bite should be smaller than your thumb nail (about 10m by 15 mm).  If you were on pureed and minced food diet plans, you may eat any of the foods included in those diets.  Avoid foods that are very dry, hard, sticky, chewy, coarse, or crunchy.  If instructed by your health care provider, thicken liquids. Follow your health care provider's instructions for what products to use, how to do this, and to what thickness. ? Your health care provider may recommend using a commercial thickener, rice cereal, or potato flakes. Ask your health care provider to recommend thickeners. ? Thickened liquids are usually a pudding-like consistency, or they may be as thick as honey or thick enough to eat with a spoon. Cooking  To moisten foods, you may add liquids while you are blending, mashing, or grinding your foods to the right consistency. These liquids include gravies, sauces, vegetable or fruit juice, milk, half and half, or water.  Strain extra liquid from foods before eating.  Reheat foods slowly to prevent a tough crust from forming.  Prepare foods in  advance. Meal planning  Eat a variety of foods to get all the nutrients you need.  Some foods may be tolerated better than others. Work with your dietitian to identify which foods are safest for you to eat.  Follow your meal plan as told by your dietitian. What foods are allowed? Grains Moist breads without nuts or seeds. Biscuits, muffins, pancakes, and waffles that are well-moistened with syrup, jelly, margarine, or butter. Cooked cereals. Moist bread stuffing. Moist rice. Well-moistened cold cereal with small chunks. Well-cooked pasta, noodles, rice, and bread dressing in small pieces and thick sauce. Soft dumplings or spaetzle in small pieces and butter or gravy. Vegetables Soft, well-cooked vegetables in small pieces. Soft-cooked, mashed potatoes. Thickened vegetable juice. Fruits Canned or cooked fruits that are soft or moist and do not have skin or seeds. Fresh, soft bananas. Thickened fruit juices. Meat and other protein foods Tender, moist meats or poultry in small pieces. Moist meatballs or meatloaf. Fish without bones. Eggs or egg substitutes in small pieces. Tofu. Tempeh and meat alternatives in small pieces. Well-cooked, tender beans, peas, baked beans, and other legumes. Dairy Thickened milk. Cream cheese. Yogurt. Cottage cheese. Sour cream. Small pieces of soft cheese. Fats and oils Butter. Oils. Margarine. Mayonnaise. Gravy. Spreads. Sweets and desserts Soft, smooth, moist desserts. Pudding. Custard. Moist cakes. Jam. Jelly. Honey. Preserves. Ask your health care provider whether you can have frozen desserts. Seasoning and other foods All seasonings and sweeteners. All sauces with small chunks. Prepared tuna, egg, or chicken  salad without raw fruits or vegetables. Moist casseroles with small, tender pieces of meat. Soups with tender meat. What foods are not allowed? Grains Coarse or dry cereals. Dry breads. Toast. Crackers. Tough, crusty breads, such as [place] bread and  baguettes. Dry pancakes, waffles, and muffins. Sticky rice. Dry bread stuffing. Granola. Popcorn. Chips. Vegetables All raw vegetables. Cooked corn. Rubbery or stiff cooked vegetables. Stringy vegetables, such as celery. Tough, crisp fried potatoes. Potato skins. Fruits Hard, crunchy, stringy, high-pulp, and juicy raw fruits such as apples, pineapple, papaya, and watermelon. Small, round fruits, such as grapes. Dried fruit and fruit leather. Meat and other protein foods Large pieces of meat. Dry, tough meats, such as bacon, sausage, and hot dogs. Chicken, [place], or fish with skin and bones. Crunchy peanut butter. Nuts. Seeds. Nut and seed butters. Dairy Yogurt with nuts, seeds, or large chunks. Large chunks of cheese. Frozen desserts and milk consistency not allowed by your dietitian. Sweets and desserts Dry cakes. Chewy or dry cookies. Any desserts with nuts, seeds, dry fruits, coconut, pineapple, or anything dry, sticky, or hard. Chewy caramel. Licorice. Taffy-type candies. Ask your health care provider whether you can have frozen desserts. Seasoning and other foods Soups with tough or large chunks of meats, poultry, or vegetables. Corn or clam chowder. Smoothies with large chunks of fruit. Summary  Bite size foods can be helpful for people with moderate swallowing problems.  On the dysphagia eating plan, you may eat foods that are soft, moist, and cut into pieces smaller than 44m by 176m  You may be instructed to thicken liquids. Follow your health care provider's instructions about how to do this and to what consistency. This information is not intended to replace advice given to you by your health care provider. Make sure you discuss any questions you have with your health care provider. Document Released: 07/07/2005 Document Revised: 10/28/2018 Document Reviewed: 10/17/2016 Elsevier Patient Education  2020 [employer]

## 2019-04-06 ENCOUNTER — Other Ambulatory Visit: Payer: Self-pay | Admitting: Gastroenterology

## 2019-04-06 ENCOUNTER — Other Ambulatory Visit: Payer: Self-pay

## 2019-04-06 DIAGNOSIS — R1310 Dysphagia, unspecified: Secondary | ICD-10-CM

## 2019-04-06 DIAGNOSIS — K219 Gastro-esophageal reflux disease without esophagitis: Secondary | ICD-10-CM

## 2019-04-06 LAB — CMP14+EGFR
ALT: 15 IU/L (ref 0–32)
AST: 22 IU/L (ref 0–40)
Albumin/Globulin Ratio: 1.8 (ref 1.2–2.2)
Albumin: 4.6 g/dL (ref 3.8–4.8)
Alkaline Phosphatase: 81 IU/L (ref 39–117)
BUN/Creatinine Ratio: 9 (ref 9–23)
BUN: 7 mg/dL (ref 6–20)
Bilirubin Total: 0.8 mg/dL (ref 0.0–1.2)
CO2: 23 mmol/L (ref 20–29)
Calcium: 9.9 mg/dL (ref 8.7–10.2)
Chloride: 103 mmol/L (ref 96–106)
Creatinine, Ser: 0.81 mg/dL (ref 0.57–1.00)
GFR calc Af Amer: 109 mL/min/{1.73_m2} (ref >59)
GFR calc non Af Amer: 94 mL/min/{1.73_m2} (ref >59)
Globulin, Total: 2.5 g/dL (ref 1.5–4.5)
Glucose: 81 mg/dL (ref 65–99)
Potassium: 4.7 mmol/L (ref 3.5–5.2)
Sodium: 139 mmol/L (ref 134–144)
Total Protein: 7.1 g/dL (ref 6.0–8.5)

## 2019-04-06 LAB — THYROID PANEL WITH TSH
Free Thyroxine Index: 2.4 (ref 1.2–4.9)
T3 Uptake Ratio: 28 % (ref 24–39)
T4, Total: 8.7 ug/dL (ref 4.5–12.0)
TSH: 5.2 u[IU]/mL — ABNORMAL HIGH (ref 0.450–4.500)

## 2019-04-06 MED ORDER — PANTOPRAZOLE SODIUM 40 MG PO TBEC: 40.0000 mg | DELAYED_RELEASE_TABLET | Freq: Two times a day (BID) | ORAL | 4 refills | Status: DC

## 2019-04-06 MED ORDER — LEVOTHYROXINE SODIUM 88 MCG PO TABS: 88.0000 ug | ORAL_TABLET | Freq: Every day | ORAL | 3 refills | Status: DC

## 2019-04-06 NOTE — Progress Notes (Signed)
No evidence of esophageal stricture, mass, or other abnormality to explain swallowing difficult. Moderate reflux was identified.   As patient was still having some reflux symptoms when I saw her in the office, I would like for her to increase her Protonix to BID, 30 minutes before breakfast and dinner. I can send in a new prescription if needed. Also needs to follow GERD diet. Can we mail handout? Reflux may be playing a role in the sensation she feels in her throat. However, at this point, I do not think her swallowing trouble is coming from an esophageal abnormality. I would like for patient to be referred to SLP for swallow study. She also needs to be referred to ENT for the "swollen" sensation in her throat and slight asymmetry of the palatopharyngeal arch.    Elmo Putt, can you let patient know of results and my recommendations? [facility] can you place referrals?

## 2019-04-06 NOTE — Addendum Note (Signed)
Addended by: Chevis Pretty on: 04/06/2019 09:36 AM   Modules accepted: Orders

## 2019-04-07 ENCOUNTER — Other Ambulatory Visit (HOSPITAL_COMMUNITY): Payer: Self-pay | Admitting: Specialist

## 2019-04-07 DIAGNOSIS — R1319 Other dysphagia: Secondary | ICD-10-CM

## 2019-04-08 ENCOUNTER — Telehealth (HOSPITAL_COMMUNITY): Payer: Self-pay | Admitting: Family Medicine

## 2019-04-08 ENCOUNTER — Telehealth (HOSPITAL_COMMUNITY): Payer: Self-pay | Admitting: Speech Pathology

## 2019-04-08 NOTE — Telephone Encounter (Signed)
04/08/19  When I spoke with patient to let he know about her MBSS she said she already had a Barium Study done.  Dabney looked at the patient's chart and said that the dr was recommending because the patient reported difficulty transferring foods from the oral cavity to the throat.  They also wanted her to see an ENT.  The symptoms could be due to esophageal reflux but it looks like the GI dr just wants to be sure.  I said that she was scheduled and if she didn't want to have the study to call us back.

## 2019-04-08 NOTE — Telephone Encounter (Signed)
Pt called back stating that she wants to cx this apptment since she has already had this study and she wants to be referred to the ENT MD as soon as possible. I called the [facility] office to let them know,  they were closed. Call [facility] to let them know apptment was cx and why.

## 2019-04-11 ENCOUNTER — Other Ambulatory Visit: Payer: Self-pay

## 2019-04-11 ENCOUNTER — Other Ambulatory Visit: Payer: Self-pay | Admitting: Nurse Practitioner

## 2019-04-11 ENCOUNTER — Ambulatory Visit (HOSPITAL_COMMUNITY)
Admission: RE | Admit: 2019-04-11 | Discharge: 2019-04-11 | Disposition: A | Discharge status: Home / Self Care | Payer: BC Managed Care – PPO | Source: Ambulatory Visit | Attending: Nurse Practitioner | Admitting: Nurse Practitioner

## 2019-04-11 ENCOUNTER — Ambulatory Visit (HOSPITAL_COMMUNITY): Payer: BC Managed Care – PPO

## 2019-04-11 DIAGNOSIS — R1313 Dysphagia, pharyngeal phase: Secondary | ICD-10-CM

## 2019-04-11 DIAGNOSIS — E041 Nontoxic single thyroid nodule: Secondary | ICD-10-CM | POA: Diagnosis not present

## 2019-04-12 ENCOUNTER — Other Ambulatory Visit (HOSPITAL_COMMUNITY): Payer: BC Managed Care – PPO

## 2019-04-12 ENCOUNTER — Ambulatory Visit (HOSPITAL_COMMUNITY): Payer: BC Managed Care – PPO | Admitting: Speech Pathology

## 2019-04-14 ENCOUNTER — Ambulatory Visit (HOSPITAL_COMMUNITY): Payer: BC Managed Care – PPO | Admitting: Speech Pathology

## 2019-04-15 DIAGNOSIS — E039 Hypothyroidism, unspecified: Secondary | ICD-10-CM | POA: Diagnosis not present

## 2019-04-15 DIAGNOSIS — E063 Autoimmune thyroiditis: Secondary | ICD-10-CM | POA: Diagnosis not present

## 2019-04-18 ENCOUNTER — Other Ambulatory Visit (HOSPITAL_COMMUNITY): Payer: Self-pay | Admitting: Endocrinology

## 2019-04-18 ENCOUNTER — Other Ambulatory Visit: Payer: Self-pay | Admitting: Endocrinology

## 2019-04-18 DIAGNOSIS — E063 Autoimmune thyroiditis: Secondary | ICD-10-CM

## 2019-04-18 DIAGNOSIS — E041 Nontoxic single thyroid nodule: Secondary | ICD-10-CM

## 2019-04-25 DIAGNOSIS — K219 Gastro-esophageal reflux disease without esophagitis: Secondary | ICD-10-CM | POA: Diagnosis not present

## 2019-04-25 DIAGNOSIS — E041 Nontoxic single thyroid nodule: Secondary | ICD-10-CM | POA: Diagnosis not present

## 2019-04-25 DIAGNOSIS — R1312 Dysphagia, oropharyngeal phase: Secondary | ICD-10-CM | POA: Diagnosis not present

## 2019-04-25 DIAGNOSIS — H9202 Otalgia, left ear: Secondary | ICD-10-CM | POA: Diagnosis not present

## 2019-04-26 ENCOUNTER — Encounter (HOSPITAL_COMMUNITY)
Admission: RE | Admit: 2019-04-26 | Discharge: 2019-04-26 | Disposition: A | Discharge status: Home / Self Care | Payer: BC Managed Care – PPO | Source: Ambulatory Visit | Attending: Endocrinology | Admitting: Endocrinology

## 2019-04-26 ENCOUNTER — Other Ambulatory Visit: Payer: Self-pay

## 2019-04-26 DIAGNOSIS — E063 Autoimmune thyroiditis: Secondary | ICD-10-CM | POA: Insufficient documentation

## 2019-04-26 DIAGNOSIS — E041 Nontoxic single thyroid nodule: Secondary | ICD-10-CM | POA: Diagnosis not present

## 2019-04-26 MED ORDER — SODIUM PERTECHNETATE TC 99M INJECTION
10.0000 | Freq: Once | INTRAVENOUS | Status: AC | PRN
  Administered 2019-04-26: 10 via INTRAVENOUS

## 2019-04-29 ENCOUNTER — Other Ambulatory Visit: Payer: Self-pay | Admitting: Otolaryngology

## 2019-04-29 DIAGNOSIS — H9202 Otalgia, left ear: Secondary | ICD-10-CM

## 2019-04-29 DIAGNOSIS — R1312 Dysphagia, oropharyngeal phase: Secondary | ICD-10-CM

## 2019-04-29 DIAGNOSIS — R070 Pain in throat: Secondary | ICD-10-CM

## 2019-05-10 ENCOUNTER — Other Ambulatory Visit: Payer: Self-pay

## 2019-05-10 ENCOUNTER — Ambulatory Visit
Admission: RE | Admit: 2019-05-10 | Discharge: 2019-05-10 | Disposition: A | Discharge status: Home / Self Care | Payer: BC Managed Care – PPO | Source: Ambulatory Visit | Attending: Otolaryngology | Admitting: Otolaryngology

## 2019-05-10 DIAGNOSIS — E039 Hypothyroidism, unspecified: Secondary | ICD-10-CM | POA: Diagnosis not present

## 2019-05-10 DIAGNOSIS — R070 Pain in throat: Secondary | ICD-10-CM

## 2019-05-10 DIAGNOSIS — R1312 Dysphagia, oropharyngeal phase: Secondary | ICD-10-CM

## 2019-05-10 DIAGNOSIS — H9202 Otalgia, left ear: Secondary | ICD-10-CM

## 2019-05-10 MED ORDER — IOPAMIDOL (ISOVUE-300) INJECTION 61%
75.0000 mL | Freq: Once | INTRAVENOUS | Status: AC | PRN
  Administered 2019-05-10: 75 mL via INTRAVENOUS

## 2019-05-10 NOTE — Progress Notes (Signed)
Primary Care Physician:  Dettinger, Fransisca Kaufmann, MD  Primary GI: [facility]  Patient Location: Home   Provider Location: [place] office   Reason for Visit: Follow-up of GERD and dysphagia   Persons present on the virtual encounter, with roles: Aliene Altes, PA-C (provider); Betty Hayes (patient)   Total time (minutes) spent on medical discussion: ### minutes   Due to COVID-19, visit was conducted using virtual method.  Visit was requested by patient.  Virtual Visit via Telephone Note Due to COVID-19, visit is conducted virtually and was requested by patient.   I connected with Betty Hayes on 05/11/19 at  9:30 AM EDT by telephone and verified that I am speaking with the correct person using two identifiers.   I discussed the limitations, risks, security and privacy concerns of performing an evaluation and management service by telephone and the availability of in person appointments. I also discussed with the patient that there may be a patient responsible charge related to this service. The patient expressed understanding and agreed to proceed.  Chief Complaint  Patient presents with  . Dysphagia     History of Present Illness: Betty Hayes is a 33 y.o. female presenting today with a history of hypothyroidism secondary to Hashimoto's who presents today for follow-up of dysphagia and GERD.  She was last seen in our office on 03/31/2019 reporting dysphagia to solid foods and large pills x2 months. GERD symptoms had worsened about 1 week prior to the onset of dysphagia however they were well controlled with breakthrough maybe once a week on Protonix 40 mg daily.  It was felt patient symptoms were more consistent with oropharyngeal dysphagia as she reported trouble getting food to the back of her throat, and when swallowing, feeling like it hangs in the back of her throat unless she drinks a lot of water.  She had also reported sensation of swelling in the back of her throat on the left side that  her endocrinologist has pointed out in the past.  Asymmetry of the palatopharyngeal arch on the left side noted on exam. Denied sore throat, nasal congestion, or PND.  Plans to obtain BPE, continue Protonix 40 mg daily, and follow GERD diet.  BPE on 04/04/2019 with moderate GERD.  No evidence of stricture, mass, ulceration, or hiatal hernia.  Barium tablet passed without delay into the stomach.  Patient was advised to increase Protonix to twice daily, follow GERD diet, see SLP for swallow study and ENT for "swollen" sensation in her throat. Patient canceled her appointment with SLP for MBSS.  Patient felt she did not need this exam as she already completed BPE.  Ultrasound of thyroid on 04/11/2019 with moderately heterogenous but normal-sized thyroid gland.  Solitary 1.3 cm nodule/pseudonodule within the inferior pole of the left lobe of the thyroid.  Recommended 1 year follow-up.  Nuclear medicine scan of thyroid on 04/26/2019.  Impression with normal thyroid scan.  The nodule on prior ultrasound was not scintigraphically identified.  Recommended follow-up thyroid ultrasound in 1 year.  Patient was seen by ENT on 04/25/2019.  She underwent laryngoscopy with no masses identified.  There was one small area of lingual tonsil tissue at the midline base of the tongue that was slightly more prominent and protrudes mildly over the epiglottis; however, they were unsure of the clinical significance and doubted this was contributing to significant dysphagia. CT of the neck with contrast was ordered. Recommended if negative, she would likely need EGD.  CT on 05/10/2019 with normal exam.  Today she states she is trying to remember the second Protonix. Sometimes forgets this. Endocrinologists states she can't take Protonix until 4 hours after taking Synthroid. She has noticed, even with taking Protonix, she will have a lot of reflux with bread and pasta. States she went off glutin a couple years ago. When she resumed  eating glutin, she noticed she didn't feel as well. Reflux is somewhat better overall. Heartburn has resolved. Has incorporated more solid foods in her diet. She had been following a soft diet. Steaks cause trouble. Will get hung in back of her throat. Seafood does fine. Endocrine placed patient on steroid for chronic inflammation of thyroid for 6 days. States this helped with some of her swallowing trouble. No dysphagia to liquids. No trouble with soft foods. Trouble with popcorn and dry foods. She did have pain with swallowing prior to steroids. This has resolved. No weight loss. No nausea or vomiting. No upper abdominal pain. Feels like she has been coughing a little more at night. Having to clear her throat frequently.  Denies nasal congestion or post nasal drip.   No lower abdominal pain. BMs daily. No constipation or diarrhea. No blood in the stool or melena.   Past Medical History:  Diagnosis Date  . Allergy   . GERD (gastroesophageal reflux disease)   . Thyroid disease    Hypothyroidism  . Thyroid nodule 2020     Past Surgical History:  Procedure Laterality Date  . ABDOMINAL HYSTERECTOMY    . KIDNEY STONE SURGERY    . TONSILLECTOMY     Around age 8  . WISDOM TOOTH EXTRACTION       Current Meds  Medication Sig  . fexofenadine (ALLEGRA) 180 MG tablet Take 180 mg by mouth as needed.   Marland Kitchen levothyroxine (SYNTHROID) 88 MCG tablet Take 1 tablet (88 mcg total) by mouth daily. (Patient taking differently: Take 100 mcg by mouth daily. )  . Multiple Vitamins-Minerals (MULTIVITAMIN ADULT PO) Take by mouth.  . pantoprazole (PROTONIX) 40 MG tablet Take 1 tablet (40 mg total) by mouth 2 (two) times daily before a meal.     Family History  Problem Relation Age of Onset  . Thrombocytopenia Mother        itp  . Heart disease Mother   . Hypertension Mother   . Seizures Brother   . ADD / ADHD Brother   . Colon cancer Paternal Grandfather        in 54s  . Colon polyps Neg Hx      Social History   Socioeconomic History  . Marital status: Married    Spouse name: Not on file  . Number of children: Not on file  . Years of education: Not on file  . Highest education level: Not on file  Occupational History  . Not on file  Social Needs  . Financial resource strain: Not on file  . Food insecurity    Worry: Not on file    Inability: Not on file  . Transportation needs    Medical: Not on file    Non-medical: Not on file  Tobacco Use  . Smoking status: Never Smoker  . Smokeless tobacco: Never Used  Substance and Sexual Activity  . Alcohol use: Yes    Comment: occasional- maybe once a month  . Drug use: No  . Sexual activity: Yes    Birth control/protection: Surgical    Comment: married 14 years  Lifestyle  . Physical activity    Days  per week: Not on file    Minutes per session: Not on file  . Stress: Not on file  Relationships  . Social Herbalist on phone: Not on file    Gets together: Not on file    Attends religious service: Not on file    Active member of club or organization: Not on file    Attends meetings of clubs or organizations: Not on file    Relationship status: Not on file  Other Topics Concern  . Not on file  Social History Narrative  . Not on file       Review of Systems: Gen: Denies fever, chills, lightheadedness, dizziness, or feeling like she will pass out.  HEENT: Denies cold or flu like symptoms.  CV: Denies chest pain, palpitations Resp: Denies dyspnea at rest, cough GI: see HPI Derm: Denies rash Psych: Denies depression. Occasional anxiety.  Heme: Denies bruising, bleeding  Observations/Objective: No distress. Unable to perform physical exam due to telephone encounter. No video available.   Assessment and Plan: 33 y.o. female with past medical history of hypothyroidism secondary to Hashimoto's who presents for further evaluation of dysphagia to solids foods x3 months and GERD. Initially, patient was seen  on 03/31/2019, and it was felt symptoms were more oropharyngeal as she reported food hanging in the back of her throat with some trouble getting food to the back of her throat. She has since underwent significant workup including BPE, [place] of thyroid, laryngoscopy, and CT of her neck as outlined in HPI with no significant explanation of her dysphagia. She was placed on a steroid for 6 days by endocrinology that seems to have helped her symptoms somewhat. Reports she also had pain with swallowing prior to steroids that has now resolved. Still having trouble with steak, popcorn, and dry foods.  Feels the foods are hanging in the back of her throat. She is taking Protonix BID, unless she forgets the second dose, and GERD symptoms have improved. Still with reflux symptoms with bread and pasta. Also notes some increase in coughing at night and having to clear her throat frequently. No nasal congestion or PND. No upper abdominal pain, weight loss, melena, or brbpr.   At this point, as patient has had significant workup without explanation of symptoms, I feel she needs EGD with possible dilation for further evaluation of her symptoms. Query whether long history of uncontrolled GERD has caused esophagitis or cervical web that was not picked up on BPE. She may also have celiac disease with worsening reflux associated with bread and pasta. Could also have EOE with improvement with steroids. As GERD has improved aside from consumption of bread and pasta, will continue Protonix BID. Considered switching to Dexilant with difficult dosing due to synthroid, but this is not on her formulary. Will continue Protonix for now.   Proceed with EGD +/- dilation with Dr. Gala Romney in the near future. The risks, benefits, and alternatives have been discussed in detail with patient. They have stated understanding and desire to proceed.  Will go ahead and check IgA and TTG IgA for celiac disease.  Continue Protonix 40 mg BID Discussed GERD  diet and lifestyle. Advised to avoid spicy, fatty, greasy, fried, citrus foods. Avoid caffeine, chocolate, and carbonated beverages. Prop bed up on wood or bricks to create 6 inch incline. Avoid eating within 3 hours of laying down. Follow-up after EGD.   Follow Up Instructions: Follow-up after EGD.    I discussed the assessment and  treatment plan with the patient. The patient was provided an opportunity to ask questions and all were answered. The patient agreed with the plan and demonstrated an understanding of the instructions.   The patient was advised to call back or seek an in-person evaluation if the symptoms worsen or if the condition fails to improve as anticipated.  I provided 17 minutes of non-face-to-face time during this encounter.  Aliene Altes, PA-C [facility]

## 2019-05-11 ENCOUNTER — Encounter: Payer: Self-pay | Admitting: Gastroenterology

## 2019-05-11 ENCOUNTER — Ambulatory Visit (INDEPENDENT_AMBULATORY_CARE_PROVIDER_SITE_OTHER): Payer: BC Managed Care – PPO | Admitting: Gastroenterology

## 2019-05-11 DIAGNOSIS — R1310 Dysphagia, unspecified: Secondary | ICD-10-CM

## 2019-05-11 DIAGNOSIS — K219 Gastro-esophageal reflux disease without esophagitis: Secondary | ICD-10-CM

## 2019-05-11 DIAGNOSIS — K900 Celiac disease: Secondary | ICD-10-CM

## 2019-05-11 NOTE — Patient Instructions (Signed)
1. We will get you scheduled for EGD with possible dilation of your esophagus in the near future with Dr. Gala Romney.   2. Please have labs completed to check for celiac disease.   3. Continue Protonix BID  4. Continue following GERD diet/lifestyle.  Avoid spicy, fatty, greasy, fried, citrus foods. Avoid caffeine, chocolate, and carbonated beverages.  Prop bed up on wood or bricks to create 6 inch incline. Avoid eating within 3 hours of laying down.   5. We will see you back after your procedure. Call if questions or concerns prior.   Aliene Altes, PA-C [facility]

## 2019-05-12 ENCOUNTER — Other Ambulatory Visit: Payer: Self-pay | Admitting: Endocrinology

## 2019-05-12 ENCOUNTER — Other Ambulatory Visit: Payer: Self-pay

## 2019-05-12 ENCOUNTER — Telehealth: Payer: Self-pay

## 2019-05-12 DIAGNOSIS — E041 Nontoxic single thyroid nodule: Secondary | ICD-10-CM

## 2019-05-12 DIAGNOSIS — K219 Gastro-esophageal reflux disease without esophagitis: Secondary | ICD-10-CM

## 2019-05-12 DIAGNOSIS — R1310 Dysphagia, unspecified: Secondary | ICD-10-CM

## 2019-05-12 NOTE — Telephone Encounter (Signed)
Called pt, EGD/-/+DIL w/RMR scheduled for 07/13/19 at 11:15am. COVID test 07/12/19 at 11:30am. Orders entered. Appt letter mailed with procedure instructions.

## 2019-05-19 ENCOUNTER — Other Ambulatory Visit: Payer: Self-pay

## 2019-05-19 NOTE — Patient Instructions (Signed)
Called [place], no PA needed for EGD/DIL.

## 2019-05-30 ENCOUNTER — Encounter: Payer: Self-pay | Admitting: Family Medicine

## 2019-06-21 DIAGNOSIS — C801 Malignant (primary) neoplasm, unspecified: Secondary | ICD-10-CM

## 2019-06-21 HISTORY — DX: Malignant (primary) neoplasm, unspecified: C80.1

## 2019-07-12 ENCOUNTER — Other Ambulatory Visit (HOSPITAL_COMMUNITY): Payer: BC Managed Care – PPO

## 2019-07-12 ENCOUNTER — Other Ambulatory Visit (HOSPITAL_COMMUNITY)
Admission: RE | Admit: 2019-07-12 | Discharge: 2019-07-12 | Disposition: A | Discharge status: Home / Self Care | Payer: BC Managed Care – PPO | Source: Ambulatory Visit | Attending: Internal Medicine | Admitting: Internal Medicine

## 2019-07-12 ENCOUNTER — Other Ambulatory Visit: Payer: Self-pay

## 2019-07-12 DIAGNOSIS — Z20828 Contact with and (suspected) exposure to other viral communicable diseases: Secondary | ICD-10-CM | POA: Diagnosis not present

## 2019-07-12 DIAGNOSIS — Z01812 Encounter for preprocedural laboratory examination: Secondary | ICD-10-CM | POA: Diagnosis not present

## 2019-07-12 LAB — SARS CORONAVIRUS 2 (TAT 6-24 HRS): SARS Coronavirus 2: NEGATIVE

## 2019-07-13 ENCOUNTER — Ambulatory Visit (HOSPITAL_COMMUNITY)
Admission: RE | Admit: 2019-07-13 | Discharge: 2019-07-13 | Disposition: A | Discharge status: Home / Self Care | Payer: BC Managed Care – PPO | Attending: Internal Medicine | Admitting: Internal Medicine

## 2019-07-13 ENCOUNTER — Other Ambulatory Visit: Payer: Self-pay

## 2019-07-13 ENCOUNTER — Encounter (HOSPITAL_COMMUNITY): Payer: Self-pay | Admitting: Internal Medicine

## 2019-07-13 ENCOUNTER — Encounter (HOSPITAL_COMMUNITY)
Admission: RE | Disposition: A | Discharge status: Home / Self Care | Payer: Self-pay | Source: Home / Self Care | Attending: Internal Medicine

## 2019-07-13 DIAGNOSIS — Z79890 Hormone replacement therapy (postmenopausal): Secondary | ICD-10-CM | POA: Diagnosis not present

## 2019-07-13 DIAGNOSIS — C8290 Follicular lymphoma, unspecified, unspecified site: Secondary | ICD-10-CM | POA: Insufficient documentation

## 2019-07-13 DIAGNOSIS — Z79899 Other long term (current) drug therapy: Secondary | ICD-10-CM | POA: Insufficient documentation

## 2019-07-13 DIAGNOSIS — Z91040 Latex allergy status: Secondary | ICD-10-CM | POA: Insufficient documentation

## 2019-07-13 DIAGNOSIS — R1314 Dysphagia, pharyngoesophageal phase: Secondary | ICD-10-CM | POA: Insufficient documentation

## 2019-07-13 DIAGNOSIS — K3189 Other diseases of stomach and duodenum: Secondary | ICD-10-CM | POA: Diagnosis not present

## 2019-07-13 DIAGNOSIS — K219 Gastro-esophageal reflux disease without esophagitis: Secondary | ICD-10-CM | POA: Diagnosis not present

## 2019-07-13 DIAGNOSIS — K9041 Non-celiac gluten sensitivity: Secondary | ICD-10-CM | POA: Insufficient documentation

## 2019-07-13 DIAGNOSIS — C8209 Follicular lymphoma grade I, extranodal and solid organ sites: Secondary | ICD-10-CM | POA: Diagnosis not present

## 2019-07-13 DIAGNOSIS — R1310 Dysphagia, unspecified: Secondary | ICD-10-CM | POA: Diagnosis not present

## 2019-07-13 DIAGNOSIS — E039 Hypothyroidism, unspecified: Secondary | ICD-10-CM | POA: Insufficient documentation

## 2019-07-13 HISTORY — PX: ESOPHAGOGASTRODUODENOSCOPY: SHX5428

## 2019-07-13 HISTORY — PX: MALONEY DILATION: SHX5535

## 2019-07-13 SURGERY — EGD (ESOPHAGOGASTRODUODENOSCOPY)
Anesthesia: Moderate Sedation

## 2019-07-13 MED ORDER — LIDOCAINE VISCOUS HCL 2 % MT SOLN
OROMUCOSAL | Status: DC | PRN
  Administered 2019-07-13: 1 via OROMUCOSAL

## 2019-07-13 MED ORDER — MEPERIDINE HCL 100 MG/ML IJ SOLN
INTRAMUSCULAR | Status: DC | PRN
  Administered 2019-07-13: 25 mg via INTRAVENOUS
  Administered 2019-07-13: 15 mg via INTRAVENOUS
  Administered 2019-07-13: 10 mg via INTRAVENOUS

## 2019-07-13 MED ORDER — MIDAZOLAM HCL 5 MG/5ML IJ SOLN
INTRAMUSCULAR | Status: DC | PRN
  Administered 2019-07-13: 1 mg via INTRAVENOUS
  Administered 2019-07-13: 2 mg via INTRAVENOUS
  Administered 2019-07-13: 1 mg via INTRAVENOUS
  Administered 2019-07-13 (×2): 2 mg via INTRAVENOUS
  Administered 2019-07-13 (×2): 1 mg via INTRAVENOUS

## 2019-07-13 MED ORDER — STERILE WATER FOR IRRIGATION IR SOLN
Status: DC | PRN
  Administered 2019-07-13: 2.5 mL

## 2019-07-13 MED ORDER — SODIUM CHLORIDE 0.9 % IV SOLN: INTRAVENOUS | Status: DC

## 2019-07-13 MED ORDER — MIDAZOLAM HCL 5 MG/5ML IJ SOLN
INTRAMUSCULAR | Status: AC
  Filled 2019-07-13: qty 10

## 2019-07-13 MED ORDER — MEPERIDINE HCL 50 MG/ML IJ SOLN
INTRAMUSCULAR | Status: AC
  Filled 2019-07-13: qty 1

## 2019-07-13 MED ORDER — ONDANSETRON HCL 4 MG/2ML IJ SOLN
INTRAMUSCULAR | Status: AC
  Filled 2019-07-13: qty 2

## 2019-07-13 MED ORDER — LIDOCAINE VISCOUS HCL 2 % MT SOLN
OROMUCOSAL | Status: AC
  Filled 2019-07-13: qty 15

## 2019-07-13 NOTE — Discharge Instructions (Signed)
EGD Discharge instructions Please read the instructions outlined below and refer to this sheet in the next few weeks. These discharge instructions provide you with general information on caring for yourself after you leave the hospital. Your doctor may also give you specific instructions. While your treatment has been planned according to the most current medical practices available, unavoidable complications occasionally occur. If you have any problems or questions after discharge, please call your doctor. ACTIVITY  You may resume your regular activity but move at a slower pace for the next 24 hours.   Take frequent rest periods for the next 24 hours.   Walking will help expel (get rid of) the air and reduce the bloated feeling in your abdomen.   No driving for 24 hours (because of the anesthesia (medicine) used during the test).   You may shower.   Do not sign any important legal documents or operate any machinery for 24 hours (because of the anesthesia used during the test).  NUTRITION  Drink plenty of fluids.   You may resume your normal diet.   Begin with a light meal and progress to your normal diet.   Avoid alcoholic beverages for 24 hours or as instructed by your caregiver.  MEDICATIONS  You may resume your normal medications unless your caregiver tells you otherwise.  WHAT YOU CAN EXPECT TODAY  You may experience abdominal discomfort such as a feeling of fullness or "gas" pains.  FOLLOW-UP  Your doctor will discuss the results of your test with you.  SEEK IMMEDIATE MEDICAL ATTENTION IF ANY OF THE FOLLOWING OCCUR:  Excessive nausea (feeling sick to your stomach) and/or vomiting.   Severe abdominal pain and distention (swelling).   Trouble swallowing.   Temperature over 101 F (37.8 C).   Rectal bleeding or vomiting of blood.   Continue Protonix 40 mg twice daily  I took samples of your esophagus and duodenum.  I will be back in touch with you next week when  the reports are available  Your esophagus was stretched today.  At patient request, I called Ladona Mow at [phone], and reviewed results.

## 2019-07-13 NOTE — H&P (Signed)
@LOGO @   Primary Care Physician:  Dettinger, Fransisca Kaufmann, MD Primary Gastroenterologist:  Dr. Gala Romney  Pre-Procedure History & Physical: HPI:  Betty Hayes is a 33 y.o. female here for further evaluation of esophageal dysphagia. Reflux fairly well controlled on Protonix 40 mg twice daily.  Has not had her celiac serological screen as of yet.  Exacerbation of symptoms with gluten intake.  Past Medical History:  Diagnosis Date  . Allergy   . GERD (gastroesophageal reflux disease)   . Thyroid disease    Hypothyroidism  . Thyroid nodule 2020    Past Surgical History:  Procedure Laterality Date  . ABDOMINAL HYSTERECTOMY    . KIDNEY STONE SURGERY    . TONSILLECTOMY     Around age 1  . WISDOM TOOTH EXTRACTION      Prior to Admission medications   Medication Sig Start Date End Date Taking? Authorizing Provider  fexofenadine (ALLEGRA) 180 MG tablet Take 180 mg by mouth daily as needed for allergies.    Yes [provider]  Multiple Vitamins-Minerals (MULTIVITAMIN ADULT PO) Take 1 tablet by mouth daily.    Yes [provider]  pantoprazole (PROTONIX) 40 MG tablet Take 1 tablet (40 mg total) by mouth 2 (two) times daily before a meal. 04/06/19 04/05/20 Yes Harper, Kristen S, PA-C  SYNTHROID 100 MCG tablet Take 100 mcg by mouth every morning. 06/12/19  Yes [provider]    Allergies as of 05/12/2019 - Review Complete 05/11/2019  Allergen Reaction Noted  . Latex Rash 01/03/2016    Family History  Problem Relation Age of Onset  . Thrombocytopenia Mother        itp  . Heart disease Mother   . Hypertension Mother   . Seizures Brother   . ADD / ADHD Brother   . Colon cancer Paternal Grandfather        in 25s  . Colon polyps Neg Hx     Social History   Socioeconomic History  . Marital status: Married    Spouse name: Not on file  . Number of children: Not on file  . Years of education: Not on file  . Highest education level: Not on file  Occupational  History  . Not on file  Tobacco Use  . Smoking status: Never Smoker  . Smokeless tobacco: Never Used  Substance and Sexual Activity  . Alcohol use: Yes    Comment: occasional- maybe once a month  . Drug use: No  . Sexual activity: Yes    Birth control/protection: Surgical    Comment: married 14 years  Other Topics Concern  . Not on file  Social History Narrative  . Not on file   Social Determinants of Health   Financial Resource Strain:   . Difficulty of Paying Living Expenses: Not on file  Food Insecurity:   . Worried About Charity fundraiser in the Last Year: Not on file  . Ran Out of Food in the Last Year: Not on file  Transportation Needs:   . Lack of Transportation (Medical): Not on file  . Lack of Transportation (Non-Medical): Not on file  Physical Activity:   . Days of Exercise per Week: Not on file  . Minutes of Exercise per Session: Not on file  Stress:   . Feeling of Stress : Not on file  Social Connections:   . Frequency of Communication with Friends and Family: Not on file  . Frequency of Social Gatherings with Friends and Family: Not on  file  . Attends Religious Services: Not on file  . Active Member of Clubs or Organizations: Not on file  . Attends Archivist Meetings: Not on file  . Marital Status: Not on file  Intimate Partner Violence:   . Fear of Current or Ex-Partner: Not on file  . Emotionally Abused: Not on file  . Physically Abused: Not on file  . Sexually Abused: Not on file    Review of Systems: See HPI, otherwise negative ROS  Physical Exam: BP 125/89   Pulse 79   Temp 98.7 F (37.1 C) (Oral)   Resp 15   Ht 5' 7"  (1.702 m)   Wt 90.7 kg   SpO2 100%   BMI 31.32 kg/m  General:   Alert,  Well-developed, well-nourished, pleasant and cooperative in NAD Neck:  Supple; no masses or thyromegaly. No significant cervical adenopathy. Lungs:  Clear throughout to auscultation.   No wheezes, crackles, or rhonchi. No acute  distress. Heart:  Regular rate and rhythm; no murmurs, clicks, rubs,  or gallops. Abdomen: Non-distended, normal bowel sounds.  Soft and nontender without appreciable mass or hepatosplenomegaly.  Pulses:  Normal pulses noted. Extremities:  Without clubbing or edema.  Impression/Plan: 33 year old lady with esophageal dysphagia.  Some gluten intolerance although symptoms somewhat nonspecific.  Reflux fairly well controlled on twice daily PPI.    I have offered the patient a EGD with possible esophageal dilation as feasible/appropriate per plan.  The risks, benefits, limitations, alternatives and imponderables have been reviewed with the patient. Potential for esophageal dilation, biopsy, etc. have also been reviewed.  Questions have been answered. All parties agreeable.     Notice: This dictation was prepared with Dragon dictation along with smaller phrase technology. Any transcriptional errors that result from this process are unintentional and may not be corrected upon review.

## 2019-07-13 NOTE — Op Note (Signed)
[facility] Patient Name: Betty Hayes Procedure Date: 07/13/2019 10:58 AM MRN: [id number] Date of Birth: September 02, 1985 Attending MD: Norvel Richards , MD CSN: [id number] Age: 33 Admit Type: Outpatient Procedure:                Upper GI endoscopy Indications:              Dysphagia Providers:                Norvel Richards, MD, Lurline Del, RN, Aram Candela Referring MD:              Medicines:                Midazolam 10 mg IV, Meperidine 50 mg IV,                            Ondansetron 4 mg IV Complications:            No immediate complications. Estimated Blood Loss:     Estimated blood loss was minimal. Procedure:                Pre-Anesthesia Assessment:                           - Prior to the procedure, a History and Physical                            was performed, and patient medications and                            allergies were reviewed. The patient's tolerance of                            previous anesthesia was also reviewed. The risks                            and benefits of the procedure and the sedation                            options and risks were discussed with the patient.                            All questions were answered, and informed consent                            was obtained. Prior Anticoagulants: The patient has                            taken no previous anticoagulant or antiplatelet                            agents. ASA Grade Assessment: II - A patient with  mild systemic disease. After reviewing the risks                            and benefits, the patient was deemed in                            satisfactory condition to undergo the procedure.                           After obtaining informed consent, the endoscope was                            passed under direct vision. Throughout the                            procedure, the patient's blood pressure, pulse, and                           oxygen saturations were monitored continuously. The                            [id number]-H190 ([id number]) scope was introduced through the                            mouth, and advanced to the second part of duodenum.                            The upper GI endoscopy was accomplished without                            difficulty. The patient tolerated the procedure                            well. Scope In: 11:32:21 AM Scope Out: 11:44:12 AM Total Procedure Duration: 0 hours 11 minutes 51 seconds  Findings:      The examined esophagus was normal.      The entire examined stomach was normal.      Patchy areas of whitish, nodular mucosa in the second portion of the       duodenum of uncertain significance. Did not see any scalloping which       would be typical of celiac disease. This was biopsied with a cold       forceps for histology. Estimated blood loss was minimal. The scope was       withdrawn. Dilation was performed with a Maloney dilator with mild       resistance at 69 Fr. The dilation site was examined following endoscope       reinsertion and showed no change. Estimated blood loss: none. Finally,       biopsies of the mid and distal tubular esophagus taken for histologic       study Impression:               - Normal esophagus. Dilated and biopsied.                           -  Normal stomach.                           - Mucosal changes in the duodenum as described                            above of uncertain significance. Biopsied. Moderate Sedation:      Moderate (conscious) sedation was administered by the endoscopy nurse       and supervised by the endoscopist. The following parameters were       monitored: oxygen saturation, heart rate, blood pressure, respiratory       rate, EKG, adequacy of pulmonary ventilation, and response to care.       Total physician intraservice time was 25 minutes. Recommendation:           - Patient has a contact number  available for                            emergencies. The signs and symptoms of potential                            delayed complications were discussed with the                            patient. Return to normal activities tomorrow.                            Written discharge instructions were provided to the                            patient.                           - Advance diet as tolerated.                           - Continue present medications.                           - Await pathology results.                           - Repeat upper endoscopy.                           - Return to GI clinic in 2 months. Procedure Code(s):        --- Professional ---                           [phone], Esophagogastroduodenoscopy, flexible,                            transoral; with biopsy, single or multiple                           43450, Dilation of esophagus, by unguided sound or  bougie, single or multiple passes                           99153, Moderate sedation; each additional 15                            minutes intraservice time                           G0500, Moderate sedation services provided by the                            same physician or other qualified health care                            professional performing a gastrointestinal                            endoscopic service that sedation supports,                            requiring the presence of an independent trained                            observer to assist in the monitoring of the                            patient's level of consciousness and physiological                            status; initial 15 minutes of intra-service time;                            patient age 46 years or older (additional time may                            be reported with [phone], as appropriate) Diagnosis Code(s):        --- Professional ---                           K31.89, Other diseases of  stomach and duodenum                           R13.10, Dysphagia, unspecified CPT copyright 2019 American Medical Association. All rights reserved. The codes documented in this report are preliminary and upon coder review may  be revised to meet current compliance requirements. Cristopher Estimable. Johnsie Moscoso, MD Norvel Richards, MD 07/13/2019 11:55:46 AM This report has been signed electronically. Number of Addenda: 0

## 2019-07-14 ENCOUNTER — Other Ambulatory Visit: Payer: Self-pay

## 2019-07-19 LAB — SURGICAL PATHOLOGY

## 2019-07-20 ENCOUNTER — Encounter (HOSPITAL_COMMUNITY): Payer: Self-pay | Admitting: *Deleted

## 2019-07-20 ENCOUNTER — Other Ambulatory Visit: Payer: Self-pay | Admitting: Gastroenterology

## 2019-07-20 ENCOUNTER — Telehealth: Payer: Self-pay | Admitting: Internal Medicine

## 2019-07-20 DIAGNOSIS — C8290 Follicular lymphoma, unspecified, unspecified site: Secondary | ICD-10-CM

## 2019-07-20 NOTE — Progress Notes (Signed)
Oncology Navigator Note:  Patient was referred to our office from [facility].  I called patient today to introduce myself and provide information in how I will be involved with her care. We have patient scheduled for next Thursday at 1pm. She reports that no one had called her with any results or explanations of her pathology report.  I went over the pathology results with her as she already knows her results from [place] and has been on the internet for answers.  I advised her to not search for any answers until she has all the pieces of information regarding her health. This could lead to false sense of fear and unnecessary stress/worry.  She laughed and said " That's all I have been doing since last night".  I briefly explained to her what the diagnosis means and what her next steps are going to be.  She was accepting of the information at this time.  I provided information on their first visit and what to expect.  I made sure patient was aware of directions to the cancer center.  My phone number was given so that she can call me with any questions or concerns.  She voices appreciation and understanding.

## 2019-07-20 NOTE — Telephone Encounter (Signed)
Dr. Landry Corporal  See telephone note below.

## 2019-07-20 NOTE — Telephone Encounter (Signed)
Spoke with pt. Results haven't been given to me as of yet. Pt has reviewed results on mychart and is wanting information about lymphoma. Pt states the report revealed lymphoma. Please advise.

## 2019-07-20 NOTE — Telephone Encounter (Signed)
Duodenal biopsy pathology findings consistent with a low grade (grade 1-2 of 3) follicular lymphoma.  Discussed case with Dr. Delton Coombes who recommends we order a PET scan to mid thigh.  He also offered to see patient in office next week.  Patient is scheduled to see him on 07/28/2019.  She is already aware of this appointment.   She did also state Protonix twice daily is still not working very well and would like to try Dexilant as we discussed at last office visit.  We will provide her with samples to try first.  She will come by and pick these up next week.  Alicia: Can you please hold Dexilant samples for this patient? [facility] clinical pool: Please arrange PET scan to mid thigh ASAP. Dx: Follicular lymphoma initial staging. Stacey: Please arrange office visit in the next 1-2 months.

## 2019-07-20 NOTE — Telephone Encounter (Signed)
Pt had procedure last week and was calling to see if her results were available. [phone]

## 2019-07-21 ENCOUNTER — Encounter: Payer: Self-pay | Admitting: Internal Medicine

## 2019-07-21 NOTE — Addendum Note (Signed)
Addended by: Cheron Every on: 07/21/2019 08:06 AM   Modules accepted: Orders

## 2019-07-21 NOTE — Telephone Encounter (Signed)
Left a detailed message for pt. Samples are ready for pickup per [facility] on 07/20/2019.

## 2019-07-21 NOTE — Telephone Encounter (Signed)
PATIENT SCHEDULED AND LETTER SENT  °

## 2019-07-21 NOTE — Telephone Encounter (Signed)
PA approved via [employer] website. Auth# [id number] Dates 07/21/19-08/19/2019  PET scheduled for 07/25/2019 at 8:30pm, arrival time 8:00pm, npo 6 hours prior, last meal should be low carb, will be there for 1- 1/2 hrs.  Called patient. She states she already saw appointment on her mychart account. Made aware of instructions as well.

## 2019-07-25 ENCOUNTER — Ambulatory Visit (HOSPITAL_COMMUNITY)
Admission: RE | Admit: 2019-07-25 | Discharge: 2019-07-25 | Disposition: A | Discharge status: Home / Self Care | Payer: BC Managed Care – PPO | Source: Ambulatory Visit | Attending: Gastroenterology | Admitting: Gastroenterology

## 2019-07-25 ENCOUNTER — Other Ambulatory Visit: Payer: Self-pay

## 2019-07-25 DIAGNOSIS — C8290 Follicular lymphoma, unspecified, unspecified site: Secondary | ICD-10-CM | POA: Insufficient documentation

## 2019-07-25 DIAGNOSIS — N281 Cyst of kidney, acquired: Secondary | ICD-10-CM | POA: Diagnosis not present

## 2019-07-25 DIAGNOSIS — Z90711 Acquired absence of uterus with remaining cervical stump: Secondary | ICD-10-CM | POA: Diagnosis not present

## 2019-07-25 DIAGNOSIS — C170 Malignant neoplasm of duodenum: Secondary | ICD-10-CM | POA: Diagnosis not present

## 2019-07-25 MED ORDER — FLUDEOXYGLUCOSE F - 18 (FDG) INJECTION
10.6600 | Freq: Once | INTRAVENOUS | Status: AC | PRN
  Administered 2019-07-25: 10.66 via INTRAVENOUS

## 2019-07-28 ENCOUNTER — Encounter (HOSPITAL_COMMUNITY): Payer: Self-pay | Admitting: Surgery

## 2019-07-28 ENCOUNTER — Encounter (HOSPITAL_COMMUNITY): Payer: Self-pay | Admitting: Hematology

## 2019-07-28 ENCOUNTER — Other Ambulatory Visit: Payer: Self-pay

## 2019-07-28 ENCOUNTER — Inpatient Hospital Stay (HOSPITAL_COMMUNITY): Payer: BC Managed Care – PPO | Attending: Hematology | Admitting: Hematology

## 2019-07-28 ENCOUNTER — Inpatient Hospital Stay (HOSPITAL_COMMUNITY): Payer: BC Managed Care – PPO

## 2019-07-28 VITALS — BP 129/80 | HR 85 | Temp 97.3°F | Resp 18 | Ht 67.0 in | Wt 205.0 lb

## 2019-07-28 DIAGNOSIS — C8200 Follicular lymphoma grade I, unspecified site: Secondary | ICD-10-CM

## 2019-07-28 DIAGNOSIS — C8299 Follicular lymphoma, unspecified, extranodal and solid organ sites: Secondary | ICD-10-CM | POA: Diagnosis not present

## 2019-07-28 DIAGNOSIS — E063 Autoimmune thyroiditis: Secondary | ICD-10-CM | POA: Diagnosis not present

## 2019-07-28 DIAGNOSIS — C8290 Follicular lymphoma, unspecified, unspecified site: Secondary | ICD-10-CM | POA: Insufficient documentation

## 2019-07-28 LAB — URIC ACID: Uric Acid, Serum: 5.3 mg/dL (ref 2.5–7.1)

## 2019-07-28 LAB — HEPATITIS C ANTIBODY: HCV Ab: NONREACTIVE

## 2019-07-28 LAB — HEPATITIS B SURFACE ANTIGEN: Hepatitis B Surface Ag: NONREACTIVE

## 2019-07-28 LAB — LACTATE DEHYDROGENASE: LDH: 125 U/L (ref 98–192)

## 2019-07-28 LAB — HEPATITIS B SURFACE ANTIBODY,QUALITATIVE: Hep B S Ab: REACTIVE — AB

## 2019-07-28 LAB — HEPATITIS B CORE ANTIBODY, TOTAL: Hep B Core Total Ab: NONREACTIVE

## 2019-07-28 NOTE — Progress Notes (Signed)
[facility] NOTE  Patient Care Team: Dettinger, Fransisca Kaufmann, MD as PCP - General (Family Medicine) Gala Romney, Cristopher Estimable, MD as Consulting Physician (Gastroenterology)  CHIEF COMPLAINTS/PURPOSE OF CONSULTATION:  Low-grade follicular lymphoma of the duodenum.  HISTORY OF PRESENTING ILLNESS:  Betty Hayes 34 y.o. female is seen in consultation today for further work-up and management of low-grade follicular lymphoma of the duodenum.  She was having acid reflux symptoms for prolonged duration and also developed intolerance to gluten.  She was evaluated by Dr. Sydell Axon and a EGD was performed on 07/13/2019 which showed patchy areas of whitish nodular mucosa in the second part of the duodenum.  Stomach and esophagus were normal.  Biopsy of the duodenum showed involvement by low-grade follicular lymphoma.  IHC was positive for CD20, BCL2, BCL6, CD10, and CD23.  Cyclin D1 was negative.  She reported no fevers.  Does not have any significant night sweats.  She reported 10 pound weight loss in the last 3 months.  She does report some tiredness over the past several years, slightly worse lately.  She works for [employer] and is physically very active.  She is a non-smoker.  Rarely drinks alcohol.  Family history significant for paternal uncle with large B-cell lymphoma.  Paternal great grandmother had leukemia.  She reports appetite of 50% and energy levels of 100%.  Occasional trouble swallowing.  Headaches with numbness of hands and arms present.  No pains reported today.  MEDICAL HISTORY:  Past Medical History:  Diagnosis Date  . Allergy   . Dysphagia   . GERD (gastroesophageal reflux disease)   . Thyroid disease    Hypothyroidism  . Thyroid nodule 2020    SURGICAL HISTORY: Past Surgical History:  Procedure Laterality Date  . ABDOMINAL HYSTERECTOMY    . ESOPHAGOGASTRODUODENOSCOPY N/A 07/13/2019   Procedure: ESOPHAGOGASTRODUODENOSCOPY (EGD);  Surgeon: Daneil Dolin, MD;   [facility];  Service: Endoscopy;  Laterality: N/A;  11:15am  . KIDNEY STONE SURGERY    . MALONEY DILATION N/A 07/13/2019   Procedure: Venia Minks DILATION;  Surgeon: Daneil Dolin, MD;  Location: [facility];  Service: Endoscopy;  Laterality: N/A;  . TONSILLECTOMY     Around age 30  . WISDOM TOOTH EXTRACTION      SOCIAL HISTORY: Social History   Socioeconomic History  . Marital status: Married    Spouse name: Not on file  . Number of children: 2  . Years of education: Not on file  . Highest education level: Not on file  Occupational History    Employer: [employer]  Tobacco Use  . Smoking status: Never Smoker  . Smokeless tobacco: Never Used  Substance and Sexual Activity  . Alcohol use: Yes    Comment: occasional- maybe once a month  . Drug use: No  . Sexual activity: Yes    Birth control/protection: Surgical    Comment: married 14 years  Other Topics Concern  . Not on file  Social History Narrative  . Not on file   Social Determinants of Health   Financial Resource Strain: Low Risk   . Difficulty of Paying Living Expenses: Not hard at all  Food Insecurity: No Food Insecurity  . Worried About Charity fundraiser in the Last Year: Never true  . Ran Out of Food in the Last Year: Never true  Transportation Needs: No Transportation Needs  . Lack of Transportation (Medical): No  . Lack of Transportation (Non-Medical): No  Physical Activity: Insufficiently  Active  . Days of Exercise per Week: 5 days  . Minutes of Exercise per Session: 20 min  Stress: Stress Concern Present  . Feeling of Stress : To some extent  Social Connections: Somewhat Isolated  . Frequency of Communication with Friends and Family: More than three times a week  . Frequency of Social Gatherings with Friends and Family: More than three times a week  . Attends Religious Services: Never  . Active Member of Clubs or Organizations: No  . Attends Archivist Meetings: Never  .  Marital Status: Married  Human resources officer Violence: Not At Risk  . Fear of Current or Ex-Partner: No  . Emotionally Abused: No  . Physically Abused: No  . Sexually Abused: No    FAMILY HISTORY: Family History  Problem Relation Age of Onset  . Thrombocytopenia Mother        itp  . Heart disease Mother   . Hypertension Mother   . Seizures Brother   . ADD / ADHD Brother   . Colon cancer Paternal Grandfather        in 59s  . Diabetes Maternal Grandmother   . Stroke Maternal Grandmother   . Heart disease Maternal Grandfather   . Stroke Maternal Grandfather   . Hypothyroidism Paternal Grandmother   . Psoriasis Brother   . Asthma Daughter   . Kidney disease Maternal Uncle   . Non-Hodgkin's lymphoma Paternal Uncle   . Leukemia Paternal Great-grandmother   . Ovarian cancer Maternal Great-grandmother   . Breast cancer Other        maternal great aunt  . Colon polyps Neg Hx     ALLERGIES:  is allergic to latex.  MEDICATIONS:  Current Outpatient Medications  Medication Sig Dispense Refill  . Multiple Vitamins-Minerals (MULTIVITAMIN ADULT PO) Take 1 tablet by mouth daily.     . pantoprazole (PROTONIX) 40 MG tablet Take 1 tablet (40 mg total) by mouth 2 (two) times daily before a meal. 60 tablet 4  . SYNTHROID 100 MCG tablet Take 100 mcg by mouth every morning.    . fexofenadine (ALLEGRA) 180 MG tablet Take 180 mg by mouth daily as needed for allergies.      No current facility-administered medications for this visit.    REVIEW OF SYSTEMS:   Constitutional: Denies fevers, chills or abnormal night sweats Eyes: Denies blurriness of vision, double vision or watery eyes Ears, nose, mouth, throat, and face: Denies mucositis or sore throat Respiratory: Denies cough, dyspnea or wheezes Cardiovascular: Denies palpitation, chest discomfort or lower extremity swelling Gastrointestinal: Positive for acid reflux.  Denies any diarrhea.  No bleeding per rectum. Skin: Denies abnormal skin  rashes Lymphatics: Denies new lymphadenopathy or easy bruising Neurological: Positive for headaches and numbness in the hands and arms. Behavioral/Psych: Mood is stable, no new changes  All other systems were reviewed with the patient and are negative.  PHYSICAL EXAMINATION: ECOG PERFORMANCE STATUS: 0 - Asymptomatic  Vitals:   07/28/19 1308  BP: 129/80  Pulse: 85  Resp: 18  Temp: (!) 97.3 F (36.3 C)  SpO2: 100%   Filed Weights   07/28/19 1308  Weight: 205 lb (93 kg)    GENERAL:alert, no distress and comfortable SKIN: skin color, texture, turgor are normal, no rashes or significant lesions EYES: normal, conjunctiva are pink and non-injected, sclera clear OROPHARYNX:no exudate, no erythema and lips, buccal mucosa, and tongue normal  NECK: supple, thyroid normal size, non-tender, without nodularity LYMPH:  no palpable lymphadenopathy in the cervical,  axillary or inguinal LUNGS: clear to auscultation and percussion with normal breathing effort HEART: regular rate & rhythm and no murmurs and no lower extremity edema ABDOMEN:abdomen soft, non-tender and normal bowel sounds Musculoskeletal:no cyanosis of digits and no clubbing  PSYCH: alert & oriented x 3 with fluent speech NEURO: no focal motor/sensory deficits  LABORATORY DATA:  I have reviewed the data as listed Lab Results  Component Value Date   WBC 6.5 12/04/2017   HGB 13.1 12/04/2017   HCT 37.8 12/04/2017   MCV 82 12/04/2017   PLT 202 12/04/2017     Chemistry      Component Value Date/Time   NA 139 04/05/2019 0932   K 4.7 04/05/2019 0932   CL 103 04/05/2019 0932   CO2 23 04/05/2019 0932   BUN 7 04/05/2019 0932   CREATININE 0.81 04/05/2019 0932      Component Value Date/Time   CALCIUM 9.9 04/05/2019 0932   ALKPHOS 81 04/05/2019 0932   AST 22 04/05/2019 0932   ALT 15 04/05/2019 0932   BILITOT 0.8 04/05/2019 0932       RADIOGRAPHIC STUDIES: I have personally reviewed the radiological images as listed  and agreed with the findings in the report. NM PET Image Initial (PI) Skull Base To Thigh  Result Date: 07/26/2019 CLINICAL DATA:  Initial treatment strategy for small-bowel follicular lymphoma. EXAM: NUCLEAR MEDICINE PET SKULL BASE TO THIGH TECHNIQUE: 10.7 mCi F-18 FDG was injected intravenously. Full-ring PET imaging was performed from the skull base to thigh after the radiotracer. CT data was obtained and used for attenuation correction and anatomic localization. Fasting blood glucose: 97 mg/dl COMPARISON:  None. FINDINGS: Mediastinal blood pool activity: SUV max 2.5 Liver activity: SUV max NA NECK: No hypermetabolic cervical lymphadenopathy. Incidental CT findings: none CHEST: No hypermetabolic thoracic lymphadenopathy. No suspicious pulmonary nodules. Incidental CT findings: none ABDOMEN/PELVIS: Very mild hypermetabolism along the 2nd portion of the duodenum, max SUV 3.6 (PET image 103). While nonspecific, this may correspond to the patient's duodenal lymphoma. No suspicious lymphadenopathy in the abdomen/pelvis. No abnormal hypermetabolism in the liver, spleen, pancreas, or adrenal glands. Spleen is normal in size. Mild hypermetabolism along the medial right ovary, max SUV 5.1, likely physiologic. Incidental CT findings: Bilateral renal cysts, poorly evaluated due to motion degradation. 5 mm probable renal angiomyolipoma in the left upper kidney (series 3/image 165). Mild fullness of the right renal collecting system without frank hydronephrosis. Status post hysterectomy. SKELETON: No focal hypermetabolic activity to suggest skeletal metastasis. Incidental CT findings: none IMPRESSION: Very mild hypermetabolism along the 2nd portion of the duodenum, possibly corresponding to the patient's known duodenal lymphoma. No suspicious lymphadenopathy.  Spleen is normal in size. Electronically Signed   By: Julian Hy M.D.   On: 07/26/2019 09:23    ASSESSMENT & PLAN:  Follicular lymphoma ([place]) 1.   Low-grade follicular lymphoma of the duodenum: -She reported having long history of acid reflux symptoms and intolerance to gluten. -EGD on 07/13/2019 showed patchy areas of whitish, nodular mucosa in the second part of the duodenum of uncertain significance.  These were biopsied.  Stomach and esophagus were normal. -Pathology shows normal esophageal squamous mucosa.  Duodenal biopsy shows involvement by low-grade follicular lymphoma.  There is dense lymphocyte infiltrate in the lamina propria and submucosa.  Lymphocytes are small with slightly irregular nuclear contours.  Immunohistochemistry is positive for CD20, BCL2, BCL6, CD10 and CD23.  CD3 and CD5 highlight surrounding T cells.  Cyclin D1 is negative.  Findings are consistent with  a low-grade, grade 6-2/8 follicular lymphoma. -PET CT scan on 07/25/2019 independently reviewed by me shows very mild hypermetabolism along the second portion of the duodenum, maximum SUV 3.6.  Mild hypermetabolism along the medial right ovary maximum SUV 5.1.  Bilateral renal cysts.  Fullness in the right renal collecting system without hydronephrosis. -She does not have any fevers or significant night sweats.  She lost about 10 pounds in the last 3 months.  She reports having tiredness for the past 5 years but slightly worse lately. -I had a prolonged discussion with her and her husband about diagnosis and pathology reports. -Due to very low uptake on the PET scan, the real extent of her lymphoma in the duodenum is not clear.  Is not causing any obstruction symptoms. -I have recommended bone marrow biopsy to document its limited stage disease. -We will check uric acid, LDH levels today.  We will also check hepatitis B and C serology and SPEP.  I will see her back after the bone marrow biopsy to discuss results and further plan.  2.  Hashimoto's thyroiditis: -She is on Synthroid 100 mcg daily.  3.  Family history: -Paternal uncle has diffuse large B-cell lymphoma.   Paternal great grandmother had leukemia.  Paternal grandfather had colon cancer.  Maternal great aunt had breast cancer.  Maternal great grand mother had ovarian cancer.  Orders Placed This Encounter  Procedures  . CT Biopsy    Standing Status:   Future    Standing Expiration Date:   07/27/2020    Order Specific Question:   Lab orders requested (DO NOT place separate lab orders, these will be automatically ordered during procedure specimen collection):    Answer:   Surgical Pathology    Order Specific Question:   Reason for Exam (SYMPTOM  OR DIAGNOSIS REQUIRED)    Answer:   follicullar lymphoma    Order Specific Question:   Is patient pregnant?    Answer:   No    Order Specific Question:   Preferred location?    Answer:   [facility]    Order Specific Question:   Radiology Contrast Protocol - do NOT remove file path    Answer:   \\[email or web address].pdf  . CT BONE MARROW BIOPSY & ASPIRATION    Standing Status:   Future    Standing Expiration Date:   10/25/2020    Order Specific Question:   Reason for Exam (SYMPTOM  OR DIAGNOSIS REQUIRED)    Answer:   follicullar lymphoma    Order Specific Question:   Is patient pregnant?    Answer:   No    Order Specific Question:   Preferred location?    Answer:   [facility]    Order Specific Question:   Radiology Contrast Protocol - do NOT remove file path    Answer:   \\[email or web address]\CTProtocols.pdf  . Uric acid    Standing Status:   Future    Number of Occurrences:   1    Standing Expiration Date:   07/27/2020  . Lactate dehydrogenase    Standing Status:   Future    Number of Occurrences:   1    Standing Expiration Date:   07/27/2020  . Hepatitis B surface antigen    Standing Status:   Future    Number of Occurrences:   1    Standing Expiration Date:   07/27/2020  . Hepatitis B surface antibody,qualitative    Standing Status:   Future    Number  of Occurrences:   1    Standing Expiration Date:    07/27/2020  . Hepatitis B core antibody, total    Standing Status:   Future    Number of Occurrences:   1    Standing Expiration Date:   07/27/2020  . Protein electrophoresis, serum    Standing Status:   Future    Number of Occurrences:   1    Standing Expiration Date:   07/27/2020  . Hepatitis C Antibody    Standing Status:   Future    Number of Occurrences:   1    Standing Expiration Date:   07/27/2020    All questions were answered. The patient knows to call the clinic with any problems, questions or concerns.      Derek Jack, MD 07/28/2019 6:44 PM

## 2019-07-28 NOTE — Patient Instructions (Addendum)
[place] at [facility] Discharge Instructions  You were seen today by Dr. Delton Coombes. He went over your history, family history and how you've been feeling lately. He will have blood drawn today. He will schedule you for a bone marrow biopsy in [place]. He will see you back after the biopsy for follow up.   Thank you for choosing [place] at [facility] to provide your oncology and hematology care.  To afford each patient quality time with our provider, please arrive at least 15 minutes before your scheduled appointment time.   If you have a lab appointment with the [facility] please come in thru the  Main Entrance and check in at the main information desk  You need to re-schedule your appointment should you arrive 10 or more minutes late.  We strive to give you quality time with our providers, and arriving late affects you and other patients whose appointments are after yours.  Also, if you no show three or more times for appointments you may be dismissed from the clinic at the providers discretion.     Again, thank you for choosing [facility].  Our hope is that these requests will decrease the amount of time that you wait before being seen by our physicians.       _____________________________________________________________  Should you have questions after your visit to [facility], please contact our office at [phone] between the hours of 8:00 a.m. and 4:30 p.m.  Voicemails left after 4:00 p.m. will not be returned until the following business day.  For prescription refill requests, have your pharmacy contact our office and allow 72 hours.    Cancer Center Support Programs:   > Cancer Support Group  2nd Tuesday of the month 1pm-2pm, Journey Room

## 2019-07-28 NOTE — Assessment & Plan Note (Addendum)
1.  Low-grade follicular lymphoma of the duodenum: -She reported having long history of acid reflux symptoms and intolerance to gluten. -EGD on 07/13/2019 showed patchy areas of whitish, nodular mucosa in the second part of the duodenum of uncertain significance.  These were biopsied.  Stomach and esophagus were normal. -Pathology shows normal esophageal squamous mucosa.  Duodenal biopsy shows involvement by low-grade follicular lymphoma.  There is dense lymphocyte infiltrate in the lamina propria and submucosa.  Lymphocytes are small with slightly irregular nuclear contours.  Immunohistochemistry is positive for CD20, BCL2, BCL6, CD10 and CD23.  CD3 and CD5 highlight surrounding T cells.  Cyclin D1 is negative.  Findings are consistent with a low-grade, grade 0-3/7 follicular lymphoma. -PET CT scan on 07/25/2019 independently reviewed by me shows very mild hypermetabolism along the second portion of the duodenum, maximum SUV 3.6.  Mild hypermetabolism along the medial right ovary maximum SUV 5.1.  Bilateral renal cysts.  Fullness in the right renal collecting system without hydronephrosis. -She does not have any fevers or significant night sweats.  She lost about 10 pounds in the last 3 months.  She reports having tiredness for the past 5 years but slightly worse lately. -I had a prolonged discussion with her and her husband about diagnosis and pathology reports. -Due to very low uptake on the PET scan, the real extent of her lymphoma in the duodenum is not clear.  Is not causing any obstruction symptoms. -I have recommended bone marrow biopsy to document its limited stage disease. -We will check uric acid, LDH levels today.  We will also check hepatitis B and C serology and SPEP.  I will see her back after the bone marrow biopsy to discuss results and further plan.  2.  Hashimoto's thyroiditis: -She is on Synthroid 100 mcg daily.  3.  Family history: -Paternal uncle has diffuse large B-cell lymphoma.   Paternal great grandmother had leukemia.  Paternal grandfather had colon cancer.  Maternal great aunt had breast cancer.  Maternal great grand mother had ovarian cancer.

## 2019-07-29 LAB — PROTEIN ELECTROPHORESIS, SERUM
A/G Ratio: 1.5 (ref 0.7–1.7)
Albumin ELP: 4.3 g/dL (ref 2.9–4.4)
Alpha-1-Globulin: 0.2 g/dL (ref 0.0–0.4)
Alpha-2-Globulin: 0.6 g/dL (ref 0.4–1.0)
Beta Globulin: 0.9 g/dL (ref 0.7–1.3)
Gamma Globulin: 1.1 g/dL (ref 0.4–1.8)
Globulin, Total: 2.8 g/dL (ref 2.2–3.9)
Total Protein ELP: 7.1 g/dL (ref 6.0–8.5)

## 2019-08-01 ENCOUNTER — Other Ambulatory Visit: Payer: Self-pay | Admitting: Radiology

## 2019-08-02 ENCOUNTER — Other Ambulatory Visit: Payer: Self-pay

## 2019-08-02 ENCOUNTER — Ambulatory Visit (HOSPITAL_COMMUNITY)
Admission: RE | Admit: 2019-08-02 | Discharge: 2019-08-02 | Disposition: A | Discharge status: Home / Self Care | Payer: BC Managed Care – PPO | Source: Ambulatory Visit | Attending: Hematology | Admitting: Hematology

## 2019-08-02 ENCOUNTER — Encounter (HOSPITAL_COMMUNITY): Payer: Self-pay

## 2019-08-02 DIAGNOSIS — Z79890 Hormone replacement therapy (postmenopausal): Secondary | ICD-10-CM | POA: Insufficient documentation

## 2019-08-02 DIAGNOSIS — C8200 Follicular lymphoma grade I, unspecified site: Secondary | ICD-10-CM | POA: Diagnosis not present

## 2019-08-02 DIAGNOSIS — C8290 Follicular lymphoma, unspecified, unspecified site: Secondary | ICD-10-CM | POA: Diagnosis not present

## 2019-08-02 DIAGNOSIS — E039 Hypothyroidism, unspecified: Secondary | ICD-10-CM | POA: Diagnosis not present

## 2019-08-02 DIAGNOSIS — Z79899 Other long term (current) drug therapy: Secondary | ICD-10-CM | POA: Insufficient documentation

## 2019-08-02 DIAGNOSIS — K219 Gastro-esophageal reflux disease without esophagitis: Secondary | ICD-10-CM | POA: Insufficient documentation

## 2019-08-02 LAB — CBC WITH DIFFERENTIAL/PLATELET
Abs Immature Granulocytes: 0.01 10*3/uL (ref 0.00–0.07)
Basophils Absolute: 0 10*3/uL (ref 0.0–0.1)
Basophils Relative: 1 %
Eosinophils Absolute: 0.1 10*3/uL (ref 0.0–0.5)
Eosinophils Relative: 2 %
HCT: 39.4 % (ref 36.0–46.0)
Hemoglobin: 13.1 g/dL (ref 12.0–15.0)
Immature Granulocytes: 0 %
Lymphocytes Relative: 36 %
Lymphs Abs: 2.5 10*3/uL (ref 0.7–4.0)
MCH: 28.3 pg (ref 26.0–34.0)
MCHC: 33.2 g/dL (ref 30.0–36.0)
MCV: 85.1 fL (ref 80.0–100.0)
Monocytes Absolute: 0.6 10*3/uL (ref 0.1–1.0)
Monocytes Relative: 8 %
Neutro Abs: 3.7 10*3/uL (ref 1.7–7.7)
Neutrophils Relative %: 53 %
Platelets: 192 10*3/uL (ref 150–400)
RBC: 4.63 MIL/uL (ref 3.87–5.11)
RDW: 12.5 % (ref 11.5–15.5)
WBC: 6.9 10*3/uL (ref 4.0–10.5)
nRBC: 0 % (ref 0.0–0.2)

## 2019-08-02 LAB — PROTIME-INR
INR: 0.9 (ref 0.8–1.2)
Prothrombin Time: 12 seconds (ref 11.4–15.2)

## 2019-08-02 LAB — APTT: aPTT: 28 seconds (ref 24–36)

## 2019-08-02 MED ORDER — HYDROCODONE-ACETAMINOPHEN 5-325 MG PO TABS: 1.0000 | ORAL_TABLET | ORAL | Status: DC | PRN

## 2019-08-02 MED ORDER — FENTANYL CITRATE (PF) 100 MCG/2ML IJ SOLN
INTRAMUSCULAR | Status: AC
  Filled 2019-08-02: qty 6

## 2019-08-02 MED ORDER — FLUMAZENIL 0.5 MG/5ML IV SOLN
INTRAVENOUS | Status: AC
  Filled 2019-08-02: qty 5

## 2019-08-02 MED ORDER — MIDAZOLAM HCL 2 MG/2ML IJ SOLN
INTRAMUSCULAR | Status: AC | PRN
  Administered 2019-08-02 (×3): 1 mg via INTRAVENOUS

## 2019-08-02 MED ORDER — MIDAZOLAM HCL 2 MG/2ML IJ SOLN
INTRAMUSCULAR | Status: AC
  Filled 2019-08-02: qty 6

## 2019-08-02 MED ORDER — SODIUM CHLORIDE 0.9 % IV SOLN: INTRAVENOUS | Status: DC

## 2019-08-02 MED ORDER — NALOXONE HCL 0.4 MG/ML IJ SOLN
INTRAMUSCULAR | Status: AC
  Filled 2019-08-02: qty 1

## 2019-08-02 MED ORDER — FENTANYL CITRATE (PF) 100 MCG/2ML IJ SOLN
INTRAMUSCULAR | Status: AC | PRN
  Administered 2019-08-02 (×3): 50 ug via INTRAVENOUS

## 2019-08-02 NOTE — Discharge Instructions (Signed)
IR on call MD for Urgent needs [phone] (procedure day) Dressing - may remove dressing in 24 hours and shower, replace with new bandaid as needed.  Keep site clean and dry.   Moderate Conscious Sedation, Adult, Care After These instructions provide you with information about caring for yourself after your procedure. Your health care provider may also give you more specific instructions. Your treatment has been planned according to current medical practices, but problems sometimes occur. Call your health care provider if you have any problems or questions after your procedure. What can I expect after the procedure? After your procedure, it is common:  To feel sleepy for several hours.  To feel clumsy and have poor balance for several hours.  To have poor judgment for several hours.  To vomit if you eat too soon. Follow these instructions at home: For at least 24 hours after the procedure:   Do not: ? Participate in activities where you could fall or become injured. ? Drive. ? Use heavy machinery. ? Drink alcohol. ? Take sleeping pills or medicines that cause drowsiness. ? Make important decisions or sign legal documents. ? Take care of children on your own.  Rest. Eating and drinking  Follow the diet recommended by your health care provider.  If you vomit: ? Drink water, juice, or soup when you can drink without vomiting. ? Make sure you have little or no nausea before eating solid foods. General instructions  Have a responsible adult stay with you until you are awake and alert.  Take over-the-counter and prescription medicines only as told by your health care provider.  If you smoke, do not smoke without supervision.  Keep all follow-up visits as told by your health care provider. This is important. Contact a health care provider if:  You keep feeling nauseous or you keep vomiting.  You feel light-headed.  You develop a rash.  You have a fever. Get help right  away if:  You have trouble breathing. This information is not intended to replace advice given to you by your health care provider. Make sure you discuss any questions you have with your health care provider. Document Revised: 06/19/2017 Document Reviewed: 10/27/2015 Elsevier Patient Education  [place]  Bone Marrow Aspiration and Bone Marrow Biopsy, Adult, Care After This sheet gives you information about how to care for yourself after your procedure. Your health care provider may also give you more specific instructions. If you have problems or questions, contact your health care provider. What can I expect after the procedure? After the procedure, it is common to have:  Mild pain and tenderness.  Swelling.  Bruising. Follow these instructions at home: Puncture site care  Follow instructions from your health care provider about how to take care of the puncture site. Make sure you: ? Wash your hands with soap and water before and after you change your bandage (dressing). If soap and water are not available, use hand sanitizer. ? Change your dressing as told by your health care provider.  Check your puncture site every day for signs of infection. Check for: ? More redness, swelling, or pain. ? Fluid or blood. ? Warmth. ? Pus or a bad smell. Activity  Return to your normal activities as told by your health care provider. Ask your health care provider what activities are safe for you.  Do not lift anything that is heavier than 10 lb (4.5 kg), or the limit that you are told, until your health care provider says  that it is safe.  Do not drive for 24 hours if you were given a sedative during your procedure. General instructions   Take over-the-counter and prescription medicines only as told by your health care provider.  Do not take baths, swim, or use a hot tub until your health care provider approves. Ask your health care provider if you may take showers. You may only  be allowed to take sponge baths.  If directed, put ice on the affected area. To do this: ? Put ice in a plastic bag. ? Place a towel between your skin and the bag. ? Leave the ice on for 20 minutes, 2-3 times a day.  Keep all follow-up visits as told by your health care provider. This is important. Contact a health care provider if:  Your pain is not controlled with medicine.  You have a fever.  You have more redness, swelling, or pain around the puncture site.  You have fluid or blood coming from the puncture site.  Your puncture site feels warm to the touch.  You have pus or a bad smell coming from the puncture site. Summary  After the procedure, it is common to have mild pain, tenderness, swelling, and bruising.  Follow instructions from your health care provider about how to take care of the puncture site and what activities are safe for you.  Take over-the-counter and prescription medicines only as told by your health care provider.  Contact a health care provider if you have any signs of infection, such as fluid or blood coming from the puncture site. This information is not intended to replace advice given to you by your health care provider. Make sure you discuss any questions you have with your health care provider. Document Revised: 11/23/2018 Document Reviewed: 11/23/2018 Elsevier Patient Education  [place].

## 2019-08-02 NOTE — Procedures (Signed)
Interventional Radiology Procedure Note  Procedure: CT guided aspirate and core biopsy of right iliac bone Complications: None Recommendations: - Bedrest supine x 1 hrs - Hydrocodone PRN  Pain - Follow biopsy results  Signed,  Criselda Peaches, MD

## 2019-08-02 NOTE — H&P (Signed)
Chief Complaint: Patient was seen in consultation today for a bone marrow biopsy.  Referring Physician(s): Firefighter  Supervising Physician: Jacqulynn Cadet  Patient Status: [facility] - Out-pt  History of Present Illness: Betty Hayes is a 34 y.o. 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 Delton Coombes who presents today for a bone marrow biopsy to stage her disease. Betty Hayes has a history of persistent GERD/gluten intolerance and underwent EGD on 07/13/19 with Dr. Gala Romney for further evaluation. During EGD several patchy areas of whitish, nodular mucosa in the second portion of the duodenum was noted and biopsied, pathology of these biopsies were consistent with low grade follicular lymphoma. She was referred to oncology and was seen for consultation on 1/7 where it was decided to proceed with a bone marrow biopsy.  Betty Hayes states she feels well overall but is very anxious about the possibility of pain during the procedure, she states she does not tolerate pain well so this makes her nervous. She states understanding of the requested procedure and wishes to proceed as planned.   Past Medical History:  Diagnosis Date  . Allergy   . Cancer ([place]) 16/3845   follicular lymphoma  . Dysphagia   . GERD (gastroesophageal reflux disease)   . Thyroid disease    Hypothyroidism  . Thyroid nodule 2020    Past Surgical History:  Procedure Laterality Date  . ABDOMINAL HYSTERECTOMY    . ESOPHAGOGASTRODUODENOSCOPY N/A 07/13/2019   Procedure: ESOPHAGOGASTRODUODENOSCOPY (EGD);  Surgeon: Daneil Dolin, MD;  [facility];  Service: Endoscopy;  Laterality: N/A;  11:15am  . KIDNEY STONE SURGERY    . MALONEY DILATION N/A 07/13/2019   Procedure: Venia Minks DILATION;  Surgeon: Daneil Dolin, MD;  Location: [facility];  Service: Endoscopy;  Laterality: N/A;  . TONSILLECTOMY     Around age 89  . WISDOM TOOTH EXTRACTION      Allergies: Latex  Medications: Prior to Admission medications   Medication Sig Start Date End Date Taking? Authorizing Provider  fexofenadine (ALLEGRA) 180 MG tablet Take 180 mg by mouth daily as needed for allergies.     [provider]  Multiple Vitamins-Minerals (MULTIVITAMIN ADULT PO) Take 1 tablet by mouth daily.     [provider]  pantoprazole (PROTONIX) 40 MG tablet Take 1 tablet (40 mg total) by mouth 2 (two) times daily before a meal. 04/06/19 04/05/20  Aliene Altes S, PA-C  SYNTHROID 100 MCG tablet Take 100 mcg by mouth every morning. 06/12/19   [provider]     Family History  Problem Relation Age of Onset  . Thrombocytopenia Mother        itp  . Heart disease Mother   . Hypertension Mother   . Seizures Brother   . ADD / ADHD Brother   . Colon cancer Paternal Grandfather        in 25s  . Diabetes Maternal Grandmother   . Stroke Maternal Grandmother   . Heart disease Maternal Grandfather   . Stroke Maternal Grandfather   . Hypothyroidism Paternal Grandmother   . Psoriasis Brother   . Asthma Daughter   . Kidney disease Maternal Uncle   . Non-Hodgkin's lymphoma Paternal Uncle   . Leukemia Paternal Great-grandmother   . Ovarian cancer Maternal Great-grandmother   . Breast cancer Other        maternal great aunt  . Colon polyps Neg Hx  Social History   Socioeconomic History  . Marital status: Married    Spouse name: Not on file  . Number of children: 2  . Years of education: Not on file  . Highest education level: Not on file  Occupational History    Employer: [employer]  Tobacco Use  . Smoking status: Never Smoker  . Smokeless tobacco: Never Used  Substance and Sexual Activity  . Alcohol use: Yes    Comment: occasional- maybe once a month  . Drug use: No  . Sexual activity: Yes    Birth control/protection: Surgical    Comment: married 14 years  Other Topics  Concern  . Not on file  Social History Narrative  . Not on file   Social Determinants of Health   Financial Resource Strain: Low Risk   . Difficulty of Paying Living Expenses: Not hard at all  Food Insecurity: No Food Insecurity  . Worried About Charity fundraiser in the Last Year: Never true  . Ran Out of Food in the Last Year: Never true  Transportation Needs: No Transportation Needs  . Lack of Transportation (Medical): No  . Lack of Transportation (Non-Medical): No  Physical Activity: Insufficiently Active  . Days of Exercise per Week: 5 days  . Minutes of Exercise per Session: 20 min  Stress: Stress Concern Present  . Feeling of Stress : To some extent  Social Connections: Somewhat Isolated  . Frequency of Communication with Friends and Family: More than three times a week  . Frequency of Social Gatherings with Friends and Family: More than three times a week  . Attends Religious Services: Never  . Active Member of Clubs or Organizations: No  . Attends Archivist Meetings: Never  . Marital Status: Married     Review of Systems: A 12 point ROS discussed and pertinent positives are indicated in the HPI above.  All other systems are negative.  Review of Systems  Constitutional: Negative for appetite change, chills and fever.  Respiratory: Negative for cough and shortness of breath.   Cardiovascular: Negative for chest pain.  Gastrointestinal: Negative for abdominal pain, diarrhea, nausea and vomiting.  Musculoskeletal: Negative for back pain.  Skin: Negative for rash and wound.  Neurological: Negative for dizziness and headaches.    Vital Signs: BP 127/76 (BP Location: Right Arm)   Pulse 72   Temp (!) 97.5 F (36.4 C) (Oral)   Resp 18   SpO2 98%   Physical Exam Vitals reviewed.  Constitutional:      General: She is not in acute distress. HENT:     Head: Normocephalic.     Mouth/Throat:     Mouth: Mucous membranes are moist.     Pharynx: Oropharynx  is clear. No oropharyngeal exudate or posterior oropharyngeal erythema.  Cardiovascular:     Rate and Rhythm: Normal rate and regular rhythm.  Pulmonary:     Effort: Pulmonary effort is normal.     Breath sounds: Normal breath sounds.  Abdominal:     General: There is no distension.     Palpations: Abdomen is soft.     Tenderness: There is no abdominal tenderness.  Skin:    General: Skin is warm and dry.  Neurological:     Mental Status: She is alert and oriented to person, place, and time.  Psychiatric:        Mood and Affect: Mood normal.        Behavior: Behavior normal.  Thought Content: Thought content normal.        Judgment: Judgment normal.      MD Evaluation Airway: WNL Heart: WNL Abdomen: WNL Chest/ Lungs: WNL ASA  Classification: 2 Mallampati/Airway Score: One   Imaging: NM PET Image Initial (PI) Skull Base To Thigh  Result Date: 07/26/2019 CLINICAL DATA:  Initial treatment strategy for small-bowel follicular lymphoma. EXAM: NUCLEAR MEDICINE PET SKULL BASE TO THIGH TECHNIQUE: 10.7 mCi F-18 FDG was injected intravenously. Full-ring PET imaging was performed from the skull base to thigh after the radiotracer. CT data was obtained and used for attenuation correction and anatomic localization. Fasting blood glucose: 97 mg/dl COMPARISON:  None. FINDINGS: Mediastinal blood pool activity: SUV max 2.5 Liver activity: SUV max NA NECK: No hypermetabolic cervical lymphadenopathy. Incidental CT findings: none CHEST: No hypermetabolic thoracic lymphadenopathy. No suspicious pulmonary nodules. Incidental CT findings: none ABDOMEN/PELVIS: Very mild hypermetabolism along the 2nd portion of the duodenum, max SUV 3.6 (PET image 103). While nonspecific, this may correspond to the patient's duodenal lymphoma. No suspicious lymphadenopathy in the abdomen/pelvis. No abnormal hypermetabolism in the liver, spleen, pancreas, or adrenal glands. Spleen is normal in size. Mild hypermetabolism  along the medial right ovary, max SUV 5.1, likely physiologic. Incidental CT findings: Bilateral renal cysts, poorly evaluated due to motion degradation. 5 mm probable renal angiomyolipoma in the left upper kidney (series 3/image 165). Mild fullness of the right renal collecting system without frank hydronephrosis. Status post hysterectomy. SKELETON: No focal hypermetabolic activity to suggest skeletal metastasis. Incidental CT findings: none IMPRESSION: Very mild hypermetabolism along the 2nd portion of the duodenum, possibly corresponding to the patient's known duodenal lymphoma. No suspicious lymphadenopathy.  Spleen is normal in size. Electronically Signed   By: Julian Hy M.D.   On: 07/26/2019 09:23    Labs:  CBC: Recent Labs    08/02/19 0755  WBC 6.9  HGB 13.1  HCT 39.4  PLT 192    COAGS: No results for input(s): INR, APTT in the last 8760 hours.  BMP: Recent Labs    04/05/19 0932  NA 139  K 4.7  CL 103  CO2 23  GLUCOSE 81  BUN 7  CALCIUM 9.9  CREATININE 0.81  GFRNONAA 94  GFRAA 109    LIVER FUNCTION TESTS: Recent Labs    04/05/19 0932  BILITOT 0.8  AST 22  ALT 15  ALKPHOS 81  PROT 7.1  ALBUMIN 4.6    TUMOR MARKERS: No results for input(s): AFPTM, CEA, CA199, CHROMGRNA in the last 8760 hours.  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 Delton Coombes who presents today for a bone marrow biopsy for further disease staging.  Patient has been NPO since 7 pm last night, she does not take blood thinning medications. Afebrile, WBC 6.9, hgb 13.1, plt 192, INR pending at time of this note writing and will be reviewed prior to procedure.  Risks and benefits of image guided bone marrow aspiration/biopsy was discussed with the patient and/or patient's family including, but not limited to bleeding, infection, damage to adjacent structures or low yield requiring additional tests.  All of the questions  were answered and there is agreement to proceed.  Consent signed and in chart.  Thank you for this interesting consult.  I greatly enjoyed meeting [employer] and look forward to participating in their care.  A copy of this report was sent to the requesting provider on this date.  Electronically Signed: Larene Beach  A Joanny Dupree, PA-C 08/02/2019, 8:30 AM   I spent a total of30 Minutesin face to face in clinical consultation, greater than 50% of which was counseling/coordinating care for bone marrow biopsy.

## 2019-08-04 LAB — SURGICAL PATHOLOGY

## 2019-08-09 ENCOUNTER — Encounter (HOSPITAL_COMMUNITY): Payer: Self-pay | Admitting: Hematology

## 2019-08-16 ENCOUNTER — Encounter: Payer: Self-pay | Admitting: Gastroenterology

## 2019-08-16 NOTE — Progress Notes (Signed)
Primary Care Physician:  Dettinger, Fransisca Kaufmann, MD  Primary GI: Dr. Gala Romney  Patient Location: In her office at work   Provider Location: [facility] office   Reason for Visit: Follow-up of GERD and Dysphagia   Persons present on the virtual encounter, with roles: Aliene Altes, PA-C (Provider); [name] (Patient)   Total time (minutes) spent on medical discussion: 24 minutes   Due to COVID-19, visit was conducted using virtual method.  Visit was requested by patient.  Virtual Visit via Telephone Note Due to COVID-19, visit is conducted virtually and was requested by patient.   I connected with Betty Hayes on 08/18/19 at  2:00 PM EST by [email or web address] video call and verified that I am speaking with the correct person using two identifiers.   I discussed the limitations, risks, security and privacy concerns of performing an evaluation and management service by video and the availability of in person appointments. I also discussed with the patient that there may be a patient responsible charge related to this service. The patient expressed understanding and agreed to proceed.  Chief Complaint  Patient presents with  . Dysphagia    Trouble swallowing when eating,feels like tongue won't move food down     History of Present Illness: Betty Hayes is a 34 y.o. female with a history of hypothyroidism secondary to Hashimoto's who presents today for follow-up of dysphagia and GERD.  At her initial consult in September 2020, it was felt dysphagia was more oropharyngeal.  Evaluation has included BPE on 04/04/2019 with moderate GERD.  No evidence of stricture, mass, ulceration, or hiatal hernia.  Barium tablet passed without delay into the stomach. She had been advised to see SLP for MBSS due to report of difficulty getting food to the back of her throat, but patient cancelled this. Thyroid evaluation with endocrine unrevealing other than as solitary 1.3 cm nodule/pseudonodule within the inferior pole of the  left lobe of the thyroid. Laryngoscopy with ENT on 04/25/19 without masses.  Noted one small area of lingual tonsil tissue in the midline base of the tongue that was slightly more prominent and protrudes mildly over the epiglottis with unknown clinical significance and doubted contribution to significant dysphagia.  CT neck with contrast on 05/10/2019 normal.  Patient was last seen via telemedicine visit on 05/11/2019.  Noted improvement in reflux but continued to have a lot of reflux with bread and pasta. Was trying to remember second Protonix dose.  Continued with dysphagia.  Trouble with steaks, popcorn, and dry foods with sensation of hanging in the back of her throat.  Endocrine had patient complete short course of steroids for thyroid inflammation which she reported helped her swallowing somewhat. Felt she was coughing more at night and having to clear her throat frequently. As patient had significant work-up without etiology of dysphagia, planned to pursue EGD +/-dilation.  Differentials included esophagitis, cervical web, EOE. Would also evaluate for celiac disease as GERD symptoms worsened with breads and pasta.  Continue Protonix 40 mg twice daily and advised on GERD diet and lifestyle.  Celiac serologies not completed. EGD on 07/13/2019 with normal esophagus dilated and biopsied, normal stomach, patchy areas of whitish, nodular mucosa in the second portion of the duodenum s/p biopsied.  Duodenal biopsy revealed low-grade follicular lymphoma.  Esophageal biopsies benign without increased intraepithelial eosinophils.  Care was coordinated between our office and Dr. Delton Coombes and she is now following with oncology with duodenal follicular lymphoma. Additionally, Dexilant samples were provided to patient to try  for GERD.   Today:  Dysphagia: Since July last year. No neck injury prior to onset. No significant improvement with dilation. States she is paranoid on not being able to swallow. Not eating  much. Taking super small bites. Will always have water. Feels her tongue isn't getting the food to the back of her throat appropriately. No trouble swallowing bread. Will not eat candy. Doesn't feel she can chew those well. Not eating steak. Never feels food getting hung in her esophagus. Easier to swallow when head is tilted up rather than down. Reports 10 lbs weight loss in about 4 months. Thinks this is related to her decrease intake.   Discussed modified barium swallow. She states this would be last on the list due to trying to complete workup for duodenal lymphoma and not having much time to take off of work. Works 8-5 M-F and only has 7 hours left to take off.   GERD:  Bread causes reflux. Tried Dexilant, didn't notice any significant improvement compared to Protonix. Switched to taking levothyroxine at night around 10PM. Has started taking Protonix in the morning. This has helped more than anything. Still has a little reflux in the evening. Not taking the second Protonix due to levothyroxine at night. Has cut out breads and pastas for the most part. Also reports lactose intolerance. Rare soda. Rare fried foods.   No nausea or vomiting.  No significant abdominal pain. BMs daily.  No bright red blood per rectum or melena.  Past Medical History:  Diagnosis Date  . Allergy   . Cancer ([place]) 46/6599   follicular lymphoma  . Dysphagia   . GERD (gastroesophageal reflux disease)   . Thyroid disease    Hypothyroidism  . Thyroid nodule 2020     Past Surgical History:  Procedure Laterality Date  . ABDOMINAL HYSTERECTOMY    . ESOPHAGOGASTRODUODENOSCOPY N/A 07/13/2019   Procedure: ESOPHAGOGASTRODUODENOSCOPY (EGD);  Surgeon: Daneil Dolin, MD; normal esophagus s/p dilation and biopsied, normal stomach, patchy areas of whitish, nodular mucosa in the second portion of the duodenum s/p biopsy.  Duodenal biopsy with low-grade follicular lymphoma.  Esophageal biopsies benign without increased  intraepithelial eosinophils.  Marland Kitchen KIDNEY STONE SURGERY    . MALONEY DILATION N/A 07/13/2019   Procedure: Venia Minks DILATION;  Surgeon: Daneil Dolin, MD;  [facility];  Service: Endoscopy;  Laterality: N/A;  . TONSILLECTOMY     Around age 37  . WISDOM TOOTH EXTRACTION       Current Meds  Medication Sig  . fexofenadine (ALLEGRA) 180 MG tablet Take 180 mg by mouth daily as needed for allergies.   . Multiple Vitamins-Minerals (MULTIVITAMIN ADULT PO) Take 1 tablet by mouth daily.   . pantoprazole (PROTONIX) 40 MG tablet Take 1 tablet (40 mg total) by mouth 2 (two) times daily before a meal.  . SYNTHROID 100 MCG tablet Take 100 mcg by mouth every morning.     Family History  Problem Relation Age of Onset  . Thrombocytopenia Mother        itp  . Heart disease Mother   . Hypertension Mother   . Seizures Brother   . ADD / ADHD Brother   . Colon cancer Paternal Grandfather        in 70s  . Diabetes Maternal Grandmother   . Stroke Maternal Grandmother   . Heart disease Maternal Grandfather   . Stroke Maternal Grandfather   . Hypothyroidism Paternal Grandmother   . Psoriasis Brother   . Asthma  Daughter   . Kidney disease Maternal Uncle   . Non-Hodgkin's lymphoma Paternal Uncle   . Leukemia Paternal Great-grandmother   . Ovarian cancer Maternal Great-grandmother   . Breast cancer Other        maternal great aunt  . Colon polyps Neg Hx     Social History   Socioeconomic History  . Marital status: Married    Spouse name: Not on file  . Number of children: 2  . Years of education: Not on file  . Highest education level: Not on file  Occupational History    Employer: [employer]  Tobacco Use  . Smoking status: Never Smoker  . Smokeless tobacco: Never Used  Substance and Sexual Activity  . Alcohol use: Yes    Comment: occasional- maybe once a month  . Drug use: No  . Sexual activity: Yes    Birth control/protection: Surgical    Comment: married 14 years    Other Topics Concern  . Not on file  Social History Narrative  . Not on file   Social Determinants of Health   Financial Resource Strain: Low Risk   . Difficulty of Paying Living Expenses: Not hard at all  Food Insecurity: No Food Insecurity  . Worried About Charity fundraiser in the Last Year: Never true  . Ran Out of Food in the Last Year: Never true  Transportation Needs: No Transportation Needs  . Lack of Transportation (Medical): No  . Lack of Transportation (Non-Medical): No  Physical Activity: Insufficiently Active  . Days of Exercise per Week: 5 days  . Minutes of Exercise per Session: 20 min  Stress: Stress Concern Present  . Feeling of Stress : To some extent  Social Connections: Somewhat Isolated  . Frequency of Communication with Friends and Family: More than three times a week  . Frequency of Social Gatherings with Friends and Family: More than three times a week  . Attends Religious Services: Never  . Active Member of Clubs or Organizations: No  . Attends Archivist Meetings: Never  . Marital Status: Married       Review of Systems: Gen: Denies fever, chills, lightheadedness, dizziness, presyncope, or syncope. CV: Denies chest pain or palpitations Resp: Denies dyspnea or cough. GI: See HPI Derm: Denies rash Psych: Admits to a lot of anxiety.  This is related to her new diagnosis of duodenal follicular lymphoma. Heme: Denies bruising or bleeding.  Observations/Objective: No distress. Unable to perform physical exam due to video encounter.   Assessment and Plan: 35 year old female with history of dysphagia for the last 6-7 months, GERD, and newly diagnosed duodenal follicular lymphoma in December 2020 now following with oncology.   Dysphagia: Dysphagia continues. Symptoms most consistent with oropharyngeal phase dysphagia as patient reports trouble getting food to the back of her throat. Extensive prior evaluation including BPE on 04/04/2019 only  with moderate GERD. Thyroid evaluation with endocrine unrevealing other than as solitary 1.3 cm nodule/pseudonodule within the inferior pole of the left lobe of the thyroid. Laryngoscopy with ENT on 04/25/19 without masses.  Noted one small area of lingual tonsil tissue in the midline base of the tongue that was slightly more prominent and protrudes mildly over the epiglottis with unknown clinical significance and doubted contribution to significant dysphagia.  CT neck with contrast on 05/10/2019 normal. EGD on 07/13/2019 with normal esophagus dilated and biopsied and benign. No improvement after dilation. She need MBSS with SLP for further evaluation. She had cancelled prior MBSS.  Discussed this again with patient and she is agreeable.    GERD: Tried Dexilant but didn't notice this working any better than Protonix. Symptoms have improved with changing timing of levothyroxine to 10 PM and Protonix first thing in the morning. Mild reflux symptoms in the evening. Not taking afternoon Protonix. She does note significant worsening of GERD in the setting of breads and pastas. She did not complete celiac serologies after last visit. No mention of EGD path for or against celiac disease. EGD otherwise with normal esophagus and stomach. Denies melena or brbpr. No abdominal pain.   Plan: 1. Proceed with MBSS with SLP  2. Complete celiac serologies, IgA total and ttG IgA 3. Continue Protonix 40 mg first thing in the morning. Start taking Protonix 40 mg in the evening around 5 pm. This will allow for >4 hours between PPI and levothyroxine dosing.  4. Avoid fried, fatty, greasy, spicy, citrus foods.  Avoid all carbonated beverages, caffeine.  Avoid chocolate.  Do not eat within 3 hours of laying down. Prop head of bed up on wood or bricks to create 6-inch incline.  5. GERD Handout provided.  6. Follow-up in 6-8 months.   Follow Up Instructions: Follow-up in 6-8 months. Call if questions or concerns prior.    I  discussed the assessment and treatment plan with the patient. The patient was provided an opportunity to ask questions and all were answered. The patient agreed with the plan and demonstrated an understanding of the instructions.   The patient was advised to call back or seek an in-person evaluation if the symptoms worsen or if the condition fails to improve as anticipated.  I provided 24 minutes of non-face-to-face time during this encounter.  Aliene Altes, PA-C [facility]

## 2019-08-17 ENCOUNTER — Encounter: Payer: Self-pay | Admitting: Gastroenterology

## 2019-08-17 ENCOUNTER — Encounter: Payer: Self-pay | Admitting: Internal Medicine

## 2019-08-17 ENCOUNTER — Ambulatory Visit (INDEPENDENT_AMBULATORY_CARE_PROVIDER_SITE_OTHER): Payer: BC Managed Care – PPO | Admitting: Gastroenterology

## 2019-08-17 ENCOUNTER — Other Ambulatory Visit: Payer: Self-pay

## 2019-08-17 DIAGNOSIS — E063 Autoimmune thyroiditis: Secondary | ICD-10-CM | POA: Diagnosis not present

## 2019-08-17 DIAGNOSIS — K219 Gastro-esophageal reflux disease without esophagitis: Secondary | ICD-10-CM

## 2019-08-17 DIAGNOSIS — R1310 Dysphagia, unspecified: Secondary | ICD-10-CM

## 2019-08-17 DIAGNOSIS — C8293 Follicular lymphoma, unspecified, intra-abdominal lymph nodes: Secondary | ICD-10-CM

## 2019-08-17 DIAGNOSIS — R1312 Dysphagia, oropharyngeal phase: Secondary | ICD-10-CM | POA: Diagnosis not present

## 2019-08-17 NOTE — Patient Instructions (Addendum)
We will get you scheduled for a modified barium swallow with a speech language pathologist.  Have labs completed at [facility].  Continue taking Protonix.  I know the timing of this is difficult due to levothyroxine.  As you have started taking levothyroxine at night, take Protonix 40 mg first thing in the morning and around 5 PM in the evenings.  Be sure that you are avoiding fried, fatty, greasy, spicy, citrus foods.  Avoid all carbonated beverages, caffeine.  Avoid chocolate.  Do not eat within 3 hours of laying down.  Prop the head of your bed up on wood or bricks to create a 6 inch incline.  We will plan to follow-up with you in 6-8 months.  Do not hesitate to call if you have questions or concerns prior.  Aliene Altes, PA-C [facility]     Food Choices for Gastroesophageal Reflux Disease, Adult When you have gastroesophageal reflux disease (GERD), the foods you eat and your eating habits are very important. Choosing the right foods can help ease the discomfort of GERD. Consider working with a diet and nutrition specialist (dietitian) to help you make healthy food choices. What general guidelines should I follow?  Eating plan  Choose healthy foods low in fat, such as fruits, vegetables, whole grains, low-fat dairy products, and lean meat, fish, and poultry.  Eat frequent, small meals instead of three large meals each day. Eat your meals slowly, in a relaxed setting. Avoid bending over or lying down until 2-3 hours after eating.  Limit high-fat foods such as fatty meats or fried foods.  Limit your intake of oils, butter, and shortening to less than 8 teaspoons each day.  Avoid the following: ? Foods that cause symptoms. These may be different for different people. Keep a food diary to keep track of foods that cause symptoms. ? Alcohol. ? Drinking large amounts of liquid with meals. ? Eating meals during the 2-3 hours before bed.  Cook foods using methods  other than frying. This may include baking, grilling, or broiling. Lifestyle  Maintain a healthy weight. Ask your health care provider what weight is healthy for you. If you need to lose weight, work with your health care provider to do so safely.  Exercise for at least 30 minutes on 5 or more days each week, or as told by your health care provider.  Avoid wearing clothes that fit tightly around your waist and chest.  Do not use any products that contain nicotine or tobacco, such as cigarettes and e-cigarettes. If you need help quitting, ask your health care provider.  Sleep with the head of your bed raised. Use a wedge under the mattress or blocks under the bed frame to raise the head of the bed. What foods are not recommended? The items listed may not be a complete list. Talk with your dietitian about what dietary choices are best for you. Grains Pastries or quick breads with added fat. [place] toast. Vegetables Deep fried vegetables. [place] fries. Any vegetables prepared with added fat. Any vegetables that cause symptoms. For some people this may include tomatoes and tomato products, chili peppers, onions and garlic, and horseradish. Fruits Any fruits prepared with added fat. Any fruits that cause symptoms. For some people this may include citrus fruits, such as oranges, grapefruit, pineapple, and lemons. Meats and other protein foods High-fat meats, such as fatty beef or pork, hot dogs, ribs, ham, sausage, salami and bacon. Fried meat or protein, including fried fish and fried chicken.  Nuts and nut butters. Dairy Whole milk and chocolate milk. Sour cream. Cream. Ice cream. Cream cheese. Milk shakes. Beverages Coffee and tea, with or without caffeine. Carbonated beverages. Sodas. Energy drinks. Fruit juice made with acidic fruits (such as orange or grapefruit). Tomato juice. Alcoholic drinks. Fats and oils Butter. Margarine. Shortening. Ghee. Sweets and desserts Chocolate and cocoa.  Donuts. Seasoning and other foods Pepper. Peppermint and spearmint. Any condiments, herbs, or seasonings that cause symptoms. For some people, this may include curry, hot sauce, or vinegar-based salad dressings. Summary  When you have gastroesophageal reflux disease (GERD), food and lifestyle choices are very important to help ease the discomfort of GERD.  Eat frequent, small meals instead of three large meals each day. Eat your meals slowly, in a relaxed setting. Avoid bending over or lying down until 2-3 hours after eating.  Limit high-fat foods such as fatty meat or fried foods. This information is not intended to replace advice given to you by your health care provider. Make sure you discuss any questions you have with your health care provider. Document Revised: 10/28/2018 Document Reviewed: 07/08/2016 Elsevier Patient Education  [place].

## 2019-08-18 ENCOUNTER — Inpatient Hospital Stay (HOSPITAL_BASED_OUTPATIENT_CLINIC_OR_DEPARTMENT_OTHER): Payer: BC Managed Care – PPO | Admitting: Hematology

## 2019-08-18 ENCOUNTER — Other Ambulatory Visit: Payer: Self-pay

## 2019-08-18 ENCOUNTER — Encounter (HOSPITAL_COMMUNITY): Payer: Self-pay | Admitting: Hematology

## 2019-08-18 ENCOUNTER — Encounter: Payer: Self-pay | Admitting: Gastroenterology

## 2019-08-18 DIAGNOSIS — C8299 Follicular lymphoma, unspecified, extranodal and solid organ sites: Secondary | ICD-10-CM | POA: Diagnosis not present

## 2019-08-18 DIAGNOSIS — C8209 Follicular lymphoma grade I, extranodal and solid organ sites: Secondary | ICD-10-CM | POA: Diagnosis not present

## 2019-08-18 DIAGNOSIS — E063 Autoimmune thyroiditis: Secondary | ICD-10-CM | POA: Diagnosis not present

## 2019-08-18 NOTE — Assessment & Plan Note (Signed)
1.  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 -EGD on 07/13/2019 showed patchy areas of whitish, nodular mucosa in the second part of the duodenum of uncertain significance.  These were biopsied.  Stomach and esophagus were normal. -Pathology shows normal esophageal squamous mucosa.  Duodenal biopsy shows involvement by low-grade follicular lymphoma.  There is dense lymphocyte infiltrate in the lamina propria and submucosa.  Lymphocytes are small with slightly irregular nuclear contours.  Immunohistochemistry is positive for CD20, BCL2, BCL6, CD10 and CD23.  CD3 and CD5 highlight surrounding T cells.  Cyclin D1 is negative.  Findings are consistent with a low-grade, grade 3-5/5 follicular lymphoma. -PET CT scan on 07/25/2019 independently reviewed by me shows very mild hypermetabolism along the second portion of the duodenum, maximum SUV 3.6.  Mild hypermetabolism along the medial right ovary maximum SUV 5.1.  Bilateral renal cysts.  Fullness in the right renal collecting system without hydronephrosis. -She does not have any fevers or significant night sweats.  She lost about 10 pounds in the last 3 months.  She reports having tiredness for the past 5 years but slightly worse lately. -Due to the very low uptake on the PET scan, the real extent of her lymphoma in the duodenum is not clear.  Lymphoma is not causing any obstructive symptoms. -LDH, uric acid, hepatitis B and C serology were negative. -Bone marrow biopsy on 08/02/2019 shows normocellular bone marrow for age with trilineage hematopoiesis.  Chromosome analysis was 12, XX. -She reportedly changed her diet for acid reflux symptoms.  She reports occasional cramping and upset stomach about twice per week.  Denies any vomiting.  Occasional nausea sometimes before and after eating.  Again all the symptoms were present with her GERD symptoms for a long  time. -Due to the unique location of her lymphoma, its not very clear if ISRT is an option without significant side effects. -I have recommended a second opinion from Dr. Cristela Felt at [facility].  2.  Hashimoto's thyroiditis: -She is on Synthroid 100 mcg daily.  3.  Family history: -Paternal uncle has diffuse large B-cell lymphoma.  Paternal great grandmother had leukemia.  Paternal grandfather had colon cancer.  Maternal great aunt had breast cancer.  Maternal great grand mother had ovarian cancer.

## 2019-08-18 NOTE — Progress Notes (Signed)
Betty Hayes, Betty Hayes [id number]   CLINIC:  Medical Oncology/Hematology  PCP:  Dettinger, Fransisca Kaufmann, MD [place] [id number] [phone]   REASON FOR VISIT:  Follow-up for low-grade follicular lymphoma of the duodenum.  CURRENT THERAPY: Observation.    INTERVAL HISTORY:  Betty Hayes 34 y.o. female seen for follow-up of low-grade follicular lymphoma of the duodenum along with her husband today.  She had bone marrow biopsy and some blood work done.  Reports appetite of 50% and energy levels of 75%.  Reported some pain in both the legs on and off since the bone marrow biopsy 2 weeks ago.  She also reports occasional cramping in her stomach and feels nauseous sometimes before and sometimes after eating.  Denies any vomiting or significant weight changes.    REVIEW OF SYSTEMS:  Review of Systems  Constitutional: Positive for fatigue.  Neurological: Positive for numbness.  Psychiatric/Behavioral: Positive for sleep disturbance.  All other systems reviewed and are negative.    PAST MEDICAL/SURGICAL HISTORY:  Past Medical History:  Diagnosis Date  . Allergy   . Cancer ([place]) [id number]/5681   follicular lymphoma  . Dysphagia   . GERD (gastroesophageal reflux disease)   . Thyroid disease    Hypothyroidism  . Thyroid nodule 2020   Past Surgical History:  Procedure Laterality Date  . ABDOMINAL HYSTERECTOMY    . ESOPHAGOGASTRODUODENOSCOPY N/A 07/13/2019   Procedure: ESOPHAGOGASTRODUODENOSCOPY (EGD);  Surgeon: Daneil Dolin, MD; normal esophagus s/p dilation and biopsied, normal stomach, patchy areas of whitish, nodular mucosa in the second portion of the duodenum s/p biopsy.  Duodenal biopsy with low-grade follicular lymphoma.  Esophageal biopsies benign without increased intraepithelial eosinophils.  Marland Kitchen KIDNEY STONE SURGERY    . MALONEY DILATION N/A 07/13/2019   Procedure: Venia Minks DILATION;  Surgeon: Daneil Dolin, MD;  Location: [facility]  ENDO SUITE;  Service: Endoscopy;  Laterality: N/A;  . TONSILLECTOMY     Around age 42  . WISDOM TOOTH EXTRACTION       SOCIAL HISTORY:  Social History   Socioeconomic History  . Marital status: Married    Spouse name: Not on file  . Number of children: 2  . Years of education: Not on file  . Highest education level: Not on file  Occupational History    Employer: [employer]  Tobacco Use  . Smoking status: Never Smoker  . Smokeless tobacco: Never Used  Substance and Sexual Activity  . Alcohol use: Yes    Comment: occasional- maybe once a month  . Drug use: No  . Sexual activity: Yes    Birth control/protection: Surgical    Comment: married 14 years  Other Topics Concern  . Not on file  Social History Narrative  . Not on file   Social Determinants of Health   Financial Resource Strain: Low Risk   . Difficulty of Paying Living Expenses: Not hard at all  Food Insecurity: No Food Insecurity  . Worried About Charity fundraiser in the Last Year: Never true  . Ran Out of Food in the Last Year: Never true  Transportation Needs: No Transportation Needs  . Lack of Transportation (Medical): No  . Lack of Transportation (Non-Medical): No  Physical Activity: Insufficiently Active  . Days of Exercise per Week: 5 days  . Minutes of Exercise per Session: 20 min  Stress: Stress Concern Present  . Feeling of Stress : To some extent  Social Connections: Somewhat Isolated  .  Frequency of Communication with Friends and Family: More than three times a week  . Frequency of Social Gatherings with Friends and Family: More than three times a week  . Attends Religious Services: Never  . Active Member of Clubs or Organizations: No  . Attends Archivist Meetings: Never  . Marital Status: Married  Human resources officer Violence: Not At Risk  . Fear of Current or Ex-Partner: No  . Emotionally Abused: No  . Physically Abused: No  . Sexually Abused: No    FAMILY HISTORY:    Family History  Problem Relation Age of Onset  . Thrombocytopenia Mother        itp  . Heart disease Mother   . Hypertension Mother   . Seizures Brother   . ADD / ADHD Brother   . Colon cancer Paternal Grandfather        in 59s  . Diabetes Maternal Grandmother   . Stroke Maternal Grandmother   . Heart disease Maternal Grandfather   . Stroke Maternal Grandfather   . Hypothyroidism Paternal Grandmother   . Psoriasis Brother   . Asthma Daughter   . Kidney disease Maternal Uncle   . Non-Hodgkin's lymphoma Paternal Uncle   . Leukemia Paternal Great-grandmother   . Ovarian cancer Maternal Great-grandmother   . Breast cancer Other        maternal great aunt  . Colon polyps Neg Hx     CURRENT MEDICATIONS:  Outpatient Encounter Medications as of 08/18/2019  Medication Sig  . Multiple Vitamins-Minerals (MULTIVITAMIN ADULT PO) Take 1 tablet by mouth daily.   . pantoprazole (PROTONIX) 40 MG tablet Take 1 tablet (40 mg total) by mouth 2 (two) times daily before a meal.  . SYNTHROID 100 MCG tablet Take 100 mcg by mouth every morning.  . fexofenadine (ALLEGRA) 180 MG tablet Take 180 mg by mouth daily as needed for allergies.    No facility-administered encounter medications on file as of 08/18/2019.    ALLERGIES:  Allergies  Allergen Reactions  . Latex Rash     PHYSICAL EXAM:  ECOG Performance status: 0  Vitals:   08/18/19 1527  BP: 127/83  Pulse: 78  Resp: 18  Temp: (!) 97.1 F (36.2 C)  SpO2: 99%   Filed Weights   08/18/19 1527  Weight: 209 lb 4.8 oz (94.9 kg)    Physical Exam Vitals reviewed.  Constitutional:      Appearance: Normal appearance.  Skin:    General: Skin is warm.  Neurological:     General: No focal deficit present.     Mental Status: She is alert and oriented to person, place, and time.  Psychiatric:        Mood and Affect: Mood normal.        Behavior: Behavior normal.    Previous physical exam did not reveal any palpable adenopathy or  splenomegaly.  LABORATORY DATA:  I have reviewed the labs as listed.  CBC    Component Value Date/Time   WBC 6.9 08/02/2019 0755   RBC 4.63 08/02/2019 0755   HGB 13.1 08/02/2019 0755   HGB 13.1 12/04/2017 1012   HCT 39.4 08/02/2019 0755   HCT 37.8 12/04/2017 1012   PLT 192 08/02/2019 0755   PLT 202 12/04/2017 1012   MCV 85.1 08/02/2019 0755   MCV 82 12/04/2017 1012   MCH 28.3 08/02/2019 0755   MCHC 33.2 08/02/2019 0755   RDW 12.5 08/02/2019 0755   RDW 13.3 12/04/2017 1012   LYMPHSABS 2.5  08/02/2019 0755   LYMPHSABS 2.3 12/04/2017 1012   MONOABS 0.6 08/02/2019 0755   EOSABS 0.1 08/02/2019 0755   EOSABS 0.2 12/04/2017 1012   BASOSABS 0.0 08/02/2019 0755   BASOSABS 0.0 12/04/2017 1012   CMP Latest Ref Rng & Units 04/05/2019 06/30/2018 04/22/2018  Glucose 65 - 99 mg/dL 81 110(H) 84  BUN 6 - 20 mg/dL _0 Creatinine 0.57 - 1.00 mg/dL 0.81 0.78 0.68  Sodium 134 - 144 mmol/L 139 140 140  Potassium 3.5 - 5.2 mmol/L 4.7 4.2 4.5  Chloride 96 - 106 mmol/L 103 105 104  CO2 20 - 29 mmol/L _1 Calcium 8.7 - 10.2 mg/dL 9.9 9.2 9.3  Total Protein 6.0 - 8.5 g/dL 7.1 - -  Total Bilirubin 0.0 - 1.2 mg/dL 0.8 - -  Alkaline Phos 39 - 117 IU/L 81 - -  AST 0 - 40 IU/L 22 - -  ALT 0 - 32 IU/L 15 - -       DIAGNOSTIC IMAGING:  I have independently reviewed the scans and discussed with the patient.     ASSESSMENT & PLAN:   Follicular lymphoma ([place]) 1.  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 -EGD on 07/13/2019 showed patchy areas of whitish, nodular mucosa in the second part of the duodenum of uncertain significance.  These were biopsied.  Stomach and esophagus were normal. -Pathology shows normal esophageal squamous mucosa.  Duodenal biopsy shows involvement by low-grade follicular lymphoma.  There is dense lymphocyte infiltrate in the lamina propria and submucosa.   Lymphocytes are small with slightly irregular nuclear contours.  Immunohistochemistry is positive for CD20, BCL2, BCL6, CD10 and CD23.  CD3 and CD5 highlight surrounding T cells.  Cyclin D1 is negative.  Findings are consistent with a low-grade, grade 3-2/6 follicular lymphoma. -PET CT scan on 07/25/2019 independently reviewed by me shows very mild hypermetabolism along the second portion of the duodenum, maximum SUV 3.6.  Mild hypermetabolism along the medial right ovary maximum SUV 5.1.  Bilateral renal cysts.  Fullness in the right renal collecting system without hydronephrosis. -She does not have any fevers or significant night sweats.  She lost about 10 pounds in the last 3 months.  She reports having tiredness for the past 5 years but slightly worse lately. -Due to the very low uptake on the PET scan, the real extent of her lymphoma in the duodenum is not clear.  Lymphoma is not causing any obstructive symptoms. -LDH, uric acid, hepatitis B and C serology were negative. -Bone marrow biopsy on 08/02/2019 shows normocellular bone marrow for age with trilineage hematopoiesis.  Chromosome analysis was 55, XX. -She reportedly changed her diet for acid reflux symptoms.  She reports occasional cramping and upset stomach about twice per week.  Denies any vomiting.  Occasional nausea sometimes before and after eating.  Again all the symptoms were present with her GERD symptoms for a long time. -Due to the unique location of her lymphoma, its not very clear if ISRT is an option without significant side effects. -I have recommended a second opinion from Dr. Cristela Felt at [facility].  2.  Hashimoto's thyroiditis: -She is on Synthroid 100 mcg daily.  3.  Family history: -Paternal uncle has diffuse large B-cell lymphoma.  Paternal great grandmother had leukemia.  Paternal grandfather had colon cancer.  Maternal great aunt had breast cancer.  Maternal great grand mother had  ovarian cancer.      Orders  placed this encounter:  No orders of the defined types were placed in this encounter.     Derek Jack, MD [place] [phone]

## 2019-08-18 NOTE — Progress Notes (Signed)
CC'ED TO PCP 

## 2019-08-18 NOTE — Patient Instructions (Addendum)
[place] at [facility] Discharge Instructions  You were seen today by Dr. Delton Coombes. He went over your recent test results. You have a grade one lymphoma. At this time surgery and radiation are not the best options due to where your lymphoma is located. He will refer you to a Lymphoma specialist at [facility] for further evaluation. He will see you back in 5 weeks for follow up.   Thank you for choosing [place] at [facility] to provide your oncology and hematology care.  To afford each patient quality time with our provider, please arrive at least 15 minutes before your scheduled appointment time.   If you have a lab appointment with the [facility] please come in thru the  Main Entrance and check in at the main information desk  You need to re-schedule your appointment should you arrive 10 or more minutes late.  We strive to give you quality time with our providers, and arriving late affects you and other patients whose appointments are after yours.  Also, if you no show three or more times for appointments you may be dismissed from the clinic at the providers discretion.     Again, thank you for choosing [facility].  Our hope is that these requests will decrease the amount of time that you wait before being seen by our physicians.       _____________________________________________________________  Should you have questions after your visit to [facility], please contact our office at [phone] between the hours of 8:00 a.m. and 4:30 p.m.  Voicemails left after 4:00 p.m. will not be returned until the following business day.  For prescription refill requests, have your pharmacy contact our office and allow 72 hours.    Cancer Center Support Programs:   > Cancer Support Group  2nd Tuesday of the month 1pm-2pm, Journey Room

## 2019-08-19 ENCOUNTER — Other Ambulatory Visit: Payer: Self-pay

## 2019-08-19 ENCOUNTER — Encounter (HOSPITAL_COMMUNITY): Payer: Self-pay | Admitting: Lab

## 2019-08-19 ENCOUNTER — Other Ambulatory Visit (HOSPITAL_COMMUNITY)
Admission: RE | Admit: 2019-08-19 | Discharge: 2019-08-19 | Disposition: A | Discharge status: Home / Self Care | Payer: BC Managed Care – PPO | Source: Ambulatory Visit | Attending: Gastroenterology | Admitting: Gastroenterology

## 2019-08-19 DIAGNOSIS — K219 Gastro-esophageal reflux disease without esophagitis: Secondary | ICD-10-CM | POA: Insufficient documentation

## 2019-08-19 DIAGNOSIS — R1310 Dysphagia, unspecified: Secondary | ICD-10-CM

## 2019-08-19 NOTE — Progress Notes (Unsigned)
Referral sent to [facility] Dr Mindi Slicker.  Faxed to [phone].  Called office at [phone]

## 2019-08-19 NOTE — Progress Notes (Unsigned)
m °

## 2019-08-20 LAB — IGA: IgA: 164 mg/dL (ref 87–352)

## 2019-08-21 LAB — TISSUE TRANSGLUTAMINASE, IGA: Tissue Transglutaminase Ab, IgA: <2 U/mL (ref 0–3)

## 2019-08-21 NOTE — Progress Notes (Signed)
No evidence of celiac disease. She may have a sensitivity/intolerance to gluten. Although she doesn't have celiac disease, as she has worsening reflux with breads and pasta, she could trial 2-4 weeks of a complete gluten free diet to see if this improves her GERD symptoms.

## 2019-08-24 ENCOUNTER — Telehealth: Payer: Self-pay | Admitting: Family Medicine

## 2019-08-24 NOTE — Telephone Encounter (Signed)
REFERRAL REQUEST Telephone Note  What type of referral do you need? Endocrinologist  Have you been seen at our office for this problem? Yes - in 2019 - I stated to patient that she needed to come in to be seen for this Ref since she has not been seen since September 2020 & she stated that was ridiculous so I stated to her I would leave the decision to PCP. (Advise that they will likely need an appointment with their PCP before a referral can be done)  Is there a particular doctor or location that you prefer? Patient did not state.  Patient notified that referrals can take up to a week or longer to process. If they haven't heard anything within a week they should call back and speak with the referral department.

## 2019-08-24 NOTE — Telephone Encounter (Signed)
Can u do a televisit? She can't have anymore days off of work

## 2019-08-24 NOTE — Telephone Encounter (Signed)
Please let patient know that I have not seen her since 2019 and would need a new visit for the referral doctors even to consider her.  Betty Hayes did see her in 2020 and you can ask [name] if she feels comfortable with that but I think the answer is still going to be she needs to be seen again because she has not been seen in at least 6 months

## 2019-08-25 NOTE — Telephone Encounter (Signed)
Yes can be a televisit, we need to release discussed the issue so I can document it so they will know and validate the referral

## 2019-08-25 NOTE — Telephone Encounter (Signed)
Patient aware and verbalized understanding appt made for televisit

## 2019-08-29 DIAGNOSIS — C8590 Non-Hodgkin lymphoma, unspecified, unspecified site: Secondary | ICD-10-CM | POA: Diagnosis not present

## 2019-08-31 ENCOUNTER — Ambulatory Visit (INDEPENDENT_AMBULATORY_CARE_PROVIDER_SITE_OTHER): Payer: BC Managed Care – PPO | Admitting: Family Medicine

## 2019-08-31 ENCOUNTER — Encounter: Payer: Self-pay | Admitting: Family Medicine

## 2019-08-31 DIAGNOSIS — E063 Autoimmune thyroiditis: Secondary | ICD-10-CM

## 2019-08-31 DIAGNOSIS — M25571 Pain in right ankle and joints of right foot: Secondary | ICD-10-CM | POA: Diagnosis not present

## 2019-08-31 DIAGNOSIS — E038 Other specified hypothyroidism: Secondary | ICD-10-CM

## 2019-08-31 DIAGNOSIS — M25572 Pain in left ankle and joints of left foot: Secondary | ICD-10-CM

## 2019-08-31 DIAGNOSIS — R1319 Other dysphagia: Secondary | ICD-10-CM

## 2019-08-31 DIAGNOSIS — G8929 Other chronic pain: Secondary | ICD-10-CM

## 2019-08-31 DIAGNOSIS — S161XXA Strain of muscle, fascia and tendon at neck level, initial encounter: Secondary | ICD-10-CM

## 2019-08-31 NOTE — Progress Notes (Signed)
Virtual Visit via telephone Note  I connected with Betty Hayes on 08/31/19 at 1432 by telephone and verified that I am speaking with the correct person using two identifiers. Betty Hayes is currently located at home and no other people are currently with her during visit. The provider, Fransisca Kaufmann Catherine Oak, MD is located in their office at time of visit.  Call ended at 1455  I discussed the limitations, risks, security and privacy concerns of performing an evaluation and management service by telephone and the availability of in person appointments. I also discussed with the patient that there may be a patient responsible charge related to this service. The patient expressed understanding and agreed to proceed.   History and Present Illness: Patient has been seeing an endocrinologist and had some issues and would like to be referred to a new endocrinology and has thyroiditis and would like go to [place]. She would like a 2nd opinion for endocrinology  Patient also complains that her ankles are hurting and it has been going on for a few months.  She denies any major swelling.  She denies weakness or numbness.   Patient has left sided neck pain in neck and down to the shoulder blade on that left side. Patient focus   1. Hypothyroidism due to Hashimoto's thyroiditis   2. Hashimoto's thyroiditis   3. Other dysphagia   4. Chronic pain of both ankles   5. Strain of neck muscle, initial encounter     Outpatient Encounter Medications as of 08/31/2019  Medication Sig  . fexofenadine (ALLEGRA) 180 MG tablet Take 180 mg by mouth daily as needed for allergies.   . Multiple Vitamins-Minerals (MULTIVITAMIN ADULT PO) Take 1 tablet by mouth daily.   . pantoprazole (PROTONIX) 40 MG tablet Take 1 tablet (40 mg total) by mouth 2 (two) times daily before a meal.  . SYNTHROID 100 MCG tablet Take 100 mcg by mouth every morning.   No facility-administered encounter medications on file as of 08/31/2019.     Review of Systems  Constitutional: Negative for chills and fever.  Eyes: Negative for visual disturbance.  Respiratory: Negative for chest tightness and shortness of breath.   Cardiovascular: Negative for chest pain and leg swelling.  Musculoskeletal: Positive for arthralgias, myalgias and neck pain. Negative for back pain, gait problem and joint swelling.  Skin: Negative for color change and rash.  Neurological: Negative for light-headedness and headaches.  Psychiatric/Behavioral: Negative for agitation and behavioral problems.  All other systems reviewed and are negative.   Observations/Objective: Patient sounds comfortable and in no acute distress  Assessment and Plan: Problem List Items Addressed This Visit      Digestive   Dysphagia   Relevant Orders   Ambulatory referral to Endocrinology     Endocrine   Hypothyroidism - Primary   Relevant Orders   Ambulatory referral to Endocrinology   Hashimoto's thyroiditis   Relevant Orders   Ambulatory referral to Endocrinology    Other Visit Diagnoses    Chronic pain of both ankles       Strain of neck muscle, initial encounter           Follow up plan: Return if symptoms worsen or fail to improve.     I discussed the assessment and treatment plan with the patient. The patient was provided an opportunity to ask questions and all were answered. The patient agreed with the plan and demonstrated an understanding of the instructions.   The patient was advised to call  back or seek an in-person evaluation if the symptoms worsen or if the condition fails to improve as anticipated.  The above assessment and management plan was discussed with the patient. The patient verbalized understanding of and has agreed to the management plan. Patient is aware to call the clinic if symptoms persist or worsen. Patient is aware when to return to the clinic for a follow-up visit. Patient educated on when it is appropriate to go to the  emergency department.    I provided 23 minutes of non-face-to-face time during this encounter.    Worthy Rancher, MD

## 2019-09-13 DIAGNOSIS — C8209 Follicular lymphoma grade I, extranodal and solid organ sites: Secondary | ICD-10-CM | POA: Diagnosis not present

## 2019-09-13 DIAGNOSIS — C8290 Follicular lymphoma, unspecified, unspecified site: Secondary | ICD-10-CM | POA: Diagnosis not present

## 2019-09-16 DIAGNOSIS — C8209 Follicular lymphoma grade I, extranodal and solid organ sites: Secondary | ICD-10-CM | POA: Diagnosis not present

## 2019-09-29 ENCOUNTER — Encounter (HOSPITAL_COMMUNITY): Payer: Self-pay | Admitting: Hematology

## 2019-09-29 ENCOUNTER — Inpatient Hospital Stay (HOSPITAL_COMMUNITY): Payer: BC Managed Care – PPO | Attending: Hematology | Admitting: Hematology

## 2019-09-29 ENCOUNTER — Other Ambulatory Visit: Payer: Self-pay

## 2019-09-29 VITALS — BP 124/88 | HR 73 | Temp 97.5°F | Resp 16 | Wt 205.0 lb

## 2019-09-29 DIAGNOSIS — C8203 Follicular lymphoma grade I, intra-abdominal lymph nodes: Secondary | ICD-10-CM | POA: Insufficient documentation

## 2019-09-29 DIAGNOSIS — Z7189 Other specified counseling: Secondary | ICD-10-CM | POA: Diagnosis not present

## 2019-09-29 DIAGNOSIS — E063 Autoimmune thyroiditis: Secondary | ICD-10-CM | POA: Diagnosis not present

## 2019-09-29 DIAGNOSIS — C8209 Follicular lymphoma grade I, extranodal and solid organ sites: Secondary | ICD-10-CM | POA: Diagnosis not present

## 2019-09-29 NOTE — Progress Notes (Signed)
START ON PATHWAY REGIMEN - Lymphoma and CLL     Administer weekly:     Rituximab-xxxx   **Always confirm dose/schedule in your pharmacy ordering system**  Patient Characteristics: Follicular Lymphoma, Grades 1, 2, and 3A, First Line, Stage I / II Disease Type: Follicular Lymphoma, Grade 1, 2, or 3A Disease Type: Not Applicable Disease Type: Not Applicable [place] Stage: IE Line of Therapy: First Line Intent of Therapy: Non-Curative / Palliative Intent, Discussed with Patient

## 2019-09-29 NOTE — Patient Instructions (Signed)
[place] at [facility] Discharge Instructions  You were seen today by Dr. Delton Coombes. He went over your recent lab results and how you've been feeling since your last visit. He discussed the medication (Rituximab) that you will be given. It is an antibody. He will schedule you for a nurse visit to go over your treatment. He will see you back April 8th for labs, treatment and follow up.   Thank you for choosing [place] at [facility] to provide your oncology and hematology care.  To afford each patient quality time with our provider, please arrive at least 15 minutes before your scheduled appointment time.   If you have a lab appointment with the [facility] please come in thru the  Main Entrance and check in at the main information desk  You need to re-schedule your appointment should you arrive 10 or more minutes late.  We strive to give you quality time with our providers, and arriving late affects you and other patients whose appointments are after yours.  Also, if you no show three or more times for appointments you may be dismissed from the clinic at the providers discretion.     Again, thank you for choosing [facility].  Our hope is that these requests will decrease the amount of time that you wait before being seen by our physicians.       _____________________________________________________________  Should you have questions after your visit to [facility], please contact our office at [phone] between the hours of 8:00 a.m. and 4:30 p.m.  Voicemails left after 4:00 p.m. will not be returned until the following business day.  For prescription refill requests, have your pharmacy contact our office and allow 72 hours.    Cancer Center Support Programs:   > Cancer Support Group  2nd Tuesday of the month 1pm-2pm, Journey Room

## 2019-09-29 NOTE — Progress Notes (Signed)
[place], [place] [address]   CLINIC:  Medical Oncology/Hematology  PCP:  Dettinger, Fransisca Kaufmann, MD [place] [address] [phone]   REASON FOR VISIT:  Low-grade follicular lymphoma of the duodenum.  CURRENT THERAPY: Planned for weekly rituximab x4.    INTERVAL HISTORY:  [name] 34 y.o. female seen for follow-up of low-grade follicular lymphoma of the duodenum.  Reports appetite and energy levels of 75%.  She reportedly changed her diet to gluten-free about a month ago on the recommendation of her gastroenterologist.  It helped her reflux a little bit but she still continues to have nausea and diarrhea.  She also reported new onset night sweats, 4 times in the last 1 month.  2 times she had to change her clothes.  She also feels lightheaded when she has nausea.    REVIEW OF SYSTEMS:  Review of Systems  Gastrointestinal: Positive for diarrhea and nausea.  Neurological: Positive for dizziness and numbness.  Psychiatric/Behavioral: Positive for sleep disturbance.  All other systems reviewed and are negative.    PAST MEDICAL/SURGICAL HISTORY:  Past Medical History:  Diagnosis Date  . Allergy   . Cancer ([place]) 81/1031   follicular lymphoma  . Dysphagia   . GERD (gastroesophageal reflux disease)   . Thyroid disease    Hypothyroidism  . Thyroid nodule 2020   Past Surgical History:  Procedure Laterality Date  . ABDOMINAL HYSTERECTOMY    . ESOPHAGOGASTRODUODENOSCOPY N/A 07/13/2019   Procedure: ESOPHAGOGASTRODUODENOSCOPY (EGD);  Surgeon: Daneil Dolin, MD; normal esophagus s/p dilation and biopsied, normal stomach, patchy areas of whitish, nodular mucosa in the second portion of the duodenum s/p biopsy.  Duodenal biopsy with low-grade follicular lymphoma.  Esophageal biopsies benign without increased intraepithelial eosinophils.  Marland Kitchen KIDNEY STONE SURGERY    . MALONEY DILATION N/A 07/13/2019   Procedure: Venia Minks  DILATION;  Surgeon: Daneil Dolin, MD;  Location: [facility];  Service: Endoscopy;  Laterality: N/A;  . TONSILLECTOMY     Around age 56  . WISDOM TOOTH EXTRACTION       SOCIAL HISTORY:  Social History   Socioeconomic History  . Marital status: Married    Spouse name: Not on file  . Number of children: 2  . Years of education: Not on file  . Highest education level: Not on file  Occupational History    Employer: [employer]  Tobacco Use  . Smoking status: Never Smoker  . Smokeless tobacco: Never Used  Substance and Sexual Activity  . Alcohol use: Yes    Comment: occasional- maybe once a month  . Drug use: No  . Sexual activity: Yes    Birth control/protection: Surgical    Comment: married 14 years  Other Topics Concern  . Not on file  Social History Narrative  . Not on file   Social Determinants of Health   Financial Resource Strain: Low Risk   . Difficulty of Paying Living Expenses: Not hard at all  Food Insecurity: No Food Insecurity  . Worried About Charity fundraiser in the Last Year: Never true  . Ran Out of Food in the Last Year: Never true  Transportation Needs: No Transportation Needs  . Lack of Transportation (Medical): No  . Lack of Transportation (Non-Medical): No  Physical Activity: Insufficiently Active  . Days of Exercise per Week: 5 days  . Minutes of Exercise per Session: 20 min  Stress: Stress Concern Present  . Feeling  of Stress : To some extent  Social Connections: Somewhat Isolated  . Frequency of Communication with Friends and Family: More than three times a week  . Frequency of Social Gatherings with Friends and Family: More than three times a week  . Attends Religious Services: Never  . Active Member of Clubs or Organizations: No  . Attends Archivist Meetings: Never  . Marital Status: Married  Human resources officer Violence: Not At Risk  . Fear of Current or Ex-Partner: No  . Emotionally Abused: No  . Physically Abused:  No  . Sexually Abused: No    FAMILY HISTORY:  Family History  Problem Relation Age of Onset  . Thrombocytopenia Mother        itp  . Heart disease Mother   . Hypertension Mother   . Seizures Brother   . ADD / ADHD Brother   . Colon cancer Paternal Grandfather        in 27s  . Diabetes Maternal Grandmother   . Stroke Maternal Grandmother   . Heart disease Maternal Grandfather   . Stroke Maternal Grandfather   . Hypothyroidism Paternal Grandmother   . Psoriasis Brother   . Asthma Daughter   . Kidney disease Maternal Uncle   . Non-Hodgkin's lymphoma Paternal Uncle   . Leukemia Paternal Great-grandmother   . Ovarian cancer Maternal Great-grandmother   . Breast cancer Other        maternal great aunt  . Colon polyps Neg Hx     CURRENT MEDICATIONS:  Outpatient Encounter Medications as of 09/29/2019  Medication Sig  . SYNTHROID 100 MCG tablet Take 100 mcg by mouth every morning.  . fexofenadine (ALLEGRA) 180 MG tablet Take 180 mg by mouth daily as needed for allergies.   . Multiple Vitamins-Minerals (MULTIVITAMIN ADULT PO) Take 1 tablet by mouth daily.   . pantoprazole (PROTONIX) 40 MG tablet Take 1 tablet (40 mg total) by mouth 2 (two) times daily before a meal. (Patient not taking: Reported on 09/29/2019)   No facility-administered encounter medications on file as of 09/29/2019.    ALLERGIES:  Allergies  Allergen Reactions  . Latex Rash     PHYSICAL EXAM:  ECOG Performance status: 0  Vitals:   09/29/19 1536  BP: 124/88  Pulse: 73  Resp: 16  Temp: (!) 97.5 F (36.4 C)  SpO2: 98%   Filed Weights   09/29/19 1536  Weight: 205 lb (93 kg)    Physical Exam Vitals reviewed.  Constitutional:      Appearance: Normal appearance.  Lymphadenopathy:     Cervical: No cervical adenopathy.  Skin:    General: Skin is warm.  Neurological:     General: No focal deficit present.     Mental Status: She is alert and oriented to person, place, and time.  Psychiatric:         Mood and Affect: Mood normal.        Behavior: Behavior normal.    No palpable adenopathy.  LABORATORY DATA:  I have reviewed the labs as listed.  CBC    Component Value Date/Time   WBC 6.9 08/02/2019 0755   RBC 4.63 08/02/2019 0755   HGB 13.1 08/02/2019 0755   HGB 13.1 12/04/2017 1012   HCT 39.4 08/02/2019 0755   HCT 37.8 12/04/2017 1012   PLT 192 08/02/2019 0755   PLT 202 12/04/2017 1012   MCV 85.1 08/02/2019 0755   MCV 82 12/04/2017 1012   MCH 28.3 08/02/2019 0755   MCHC 33.2  08/02/2019 0755   RDW 12.5 08/02/2019 0755   RDW 13.3 12/04/2017 1012   LYMPHSABS 2.5 08/02/2019 0755   LYMPHSABS 2.3 12/04/2017 1012   MONOABS 0.6 08/02/2019 0755   EOSABS 0.1 08/02/2019 0755   EOSABS 0.2 12/04/2017 1012   BASOSABS 0.0 08/02/2019 0755   BASOSABS 0.0 12/04/2017 1012   CMP Latest Ref Rng & Units 04/05/2019 06/30/2018 04/22/2018  Glucose 65 - 99 mg/dL 81 110(H) 84  BUN 6 - 20 mg/dL '7 10 8  ' Creatinine 0.57 - 1.00 mg/dL 0.81 0.78 0.68  Sodium 134 - 144 mmol/L 139 140 140  Potassium 3.5 - 5.2 mmol/L 4.7 4.2 4.5  Chloride 96 - 106 mmol/L 103 105 104  CO2 20 - 29 mmol/L '23 22 24  ' Calcium 8.7 - 10.2 mg/dL 9.9 9.2 9.3  Total Protein 6.0 - 8.5 g/dL 7.1 - -  Total Bilirubin 0.0 - 1.2 mg/dL 0.8 - -  Alkaline Phos 39 - 117 IU/L 81 - -  AST 0 - 40 IU/L 22 - -  ALT 0 - 32 IU/L 15 - -       DIAGNOSTIC IMAGING:  I have independently reviewed the scans.     ASSESSMENT & PLAN:   Follicular lymphoma ([place]) 1.  Stage I AE duodenal follicular lymphoma: -EGD on 07/13/2019 showed patchy areas of whitish, nodular mucosa in second part of the duodenum, biopsy consistent with grade 6-8/0 follicular lymphoma. -PET scan on 07/25/2019-very mild hypermetabolism along the second portion of the duodenum, maximum SUV 3.6. -She does not have any B symptoms.  However she lost some weight due to digestive problems. -Bone marrow biopsy on 08/02/2019 shows normocellular marrow with trilineage  hematopoiesis. -Acid reflux symptoms improved mildly after she started gluten-free diet about a month ago. -She continues to have nausea and diarrhea . -She also noticed night sweats about 4 episodes in the last 1 month, drenching night sweats 2 times. -Patient was also seen by Dr. Elita Boone at [facility].  She was recommended weekly rituximab x4 to see if that helps. -Today we talked about side effects of rituximab in detail.  We have done hepatitis B and C serology which was negative previously.  We will schedule her for chemo teaching.  Because of low tumor burden, she does not require any allopurinol. -She would like to start rituximab in the second week of April. -Would repeat EGD with duodenal biopsy at least 3-6 months after completion of treatment of rituximab.  Will do scans in 6 months.  2.  Hashimoto's thyroiditis: -She is on Synthroid 100 mcg daily.  3.  Family history: -Paternal uncle had diffuse large B-cell lymphoma.  Paternal grandmother had leukemia.  Paternal grandfather had colon cancer. -Maternal great aunt had breast cancer.  Maternal great grandmother had ovarian cancer. -Mother had ITP and his reportedly received rituximab.      Orders placed this encounter:  Orders Placed This Encounter  Procedures  . CBC with Differential/Platelet  . Comprehensive metabolic panel  . Lactate dehydrogenase  . Uric acid      Derek Jack, MD [place] [phone]

## 2019-09-29 NOTE — Assessment & Plan Note (Addendum)
1.  Stage I AE duodenal follicular lymphoma: -EGD on 07/13/2019 showed patchy areas of whitish, nodular mucosa in second part of the duodenum, biopsy consistent with grade 2-2/5 follicular lymphoma. -PET scan on 07/25/2019-very mild hypermetabolism along the second portion of the duodenum, maximum SUV 3.6. -She does not have any B symptoms.  However she lost some weight due to digestive problems. -Bone marrow biopsy on 08/02/2019 shows normocellular marrow with trilineage hematopoiesis. -Acid reflux symptoms improved mildly after she started gluten-free diet about a month ago. -She continues to have nausea and diarrhea . -She also noticed night sweats about 4 episodes in the last 1 month, drenching night sweats 2 times. -Patient was also seen by Dr. Elita Boone at [facility].  She was recommended weekly rituximab x4 to see if that helps. -Today we talked about side effects of rituximab in detail.  We have done hepatitis B and C serology which was negative previously.  We will schedule her for chemo teaching.  Because of low tumor burden, she does not require any allopurinol. -She would like to start rituximab in the second week of April. -Would repeat EGD with duodenal biopsy at least 3-6 months after completion of treatment of rituximab.  Will do scans in 6 months.  2.  Hashimoto's thyroiditis: -She is on Synthroid 100 mcg daily.  3.  Family history: -Paternal uncle had diffuse large B-cell lymphoma.  Paternal grandmother had leukemia.  Paternal grandfather had colon cancer. -Maternal great aunt had breast cancer.  Maternal great grandmother had ovarian cancer. -Mother had ITP and his reportedly received rituximab.

## 2019-10-18 ENCOUNTER — Inpatient Hospital Stay (HOSPITAL_COMMUNITY): Payer: BC Managed Care – PPO | Admitting: General Practice

## 2019-10-18 DIAGNOSIS — C8209 Follicular lymphoma grade I, extranodal and solid organ sites: Secondary | ICD-10-CM

## 2019-10-18 NOTE — Patient Instructions (Addendum)
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  You will be treated weekly x 4 cycles with rituximab (Rituxan).  The intent of treatment is to control your cancer, prevent it from spreading further, and to alleviate any symptoms you may be having related to your disease.  You will see the doctor regularly throughout treatment.  We monitor your lab work prior to every treatment. The doctor monitors your response to treatment by the way you are feeling, your blood work, and scans periodically.  There will be wait times while you are here for treatment.  It will take about 30 minutes to 1 hour for your lab work to result. Then there will be wait times while pharmacy mixes your medications.     Rituximab (Generic Name) Other Name: Rituxan  About This Drug  Rituximab is a monoclonal antibody used to treat cancer. This drug is given in the vein (IV).  This first time this is given it will be infused slower to monitor for infusion reactions.    Possible Side Effects (More Common)  . Bone marrow depression. This is a decrease in the number of white blood cells, red blood cells, and platelets. This may raise your risk of infection, make you tired and weak (fatigue), and raise your risk of bleeding.  . Rash-skin irritation, redness or itching (dermatitis)  . Flu-like symptoms: fever, headache, muscle and joint aches, and fatigue (low energy, feeling weak)  . Infusion-related reactions  . Hepatitis B - if you have ever had hepatitis B, the virus may come back during treatment with this drug. Your doctor will test to see if you have ever had hepatitis B prior to your treatment.  . Changes in your central nervous system can happen. The central nervous system is made up of your brain and spinal cord. You could feel: extreme tiredness, agitation, confusion, or have: hallucinations (see or hear things that are not there), trouble  understanding or speaking, loss of control of your bowels or bladder, eyesight changes, numbness or lack of strength to your arms, legs, face, or body, seizures or coma. If you start to have any of these symptoms let your doctor know right away.  . Tumor lysis: This drug may act on the cancer cells very quickly. This may affect how your kidneys work. Your doctor will monitor your kidney function.  . Changes in your liver function. Your doctor will check your liver function as needed.  . Nausea and throwing up (vomiting): these symptoms may happen within a few hours after your treatment and may last up to 24 hours. Medicines are available to stop or lessen these side effects.  . Loose bowel movements (diarrhea) that may last for a few days  . Abdominal pain  . Infections  . Cough, runny nose  . Swelling of your legs, ankles and/or feet or hands  . High blood pressure. Your doctor will check your blood pressure as needed.  . Abnormal heart beat  Possible Side Effects (Less Common)  . Shortness of breath  . Soreness of the mouth and throat. You may have red areas, white patches, or sores that hurt.  Infusion Reactions  Infusion Reactions are the most common side effect linked to use of this drug and can be quite severe. Medicines will be given before you get the drug to lower the severity of this side effect. The infusion reactions are the worse with the first dose  of the drug and become less severe with more doses of the drug. While you are getting this drug in your vein (IV), tell your nurse right away if you have any of these symptoms of an allergic reaction:  . Trouble catching your breath  . Feeling like your tongue or throat are swelling  . Feeling your heart beat quickly or in a not normal way (palpitations)  . Feeling dizzy or lightheaded  . Flushing, itching, rash, and/or hives  Treating Side Effects  . Ask your doctor or nurse about medicine to stop or lessen  headache, loose bowel movements (diarrhea), constipation, nausea, throwing up (vomiting), or pain.  . If you get a rash do not put anything it unless your doctor or nurse says you may. Keep the area around the rash clean and dry. Ask your doctor for medicine if the rash bothers you.  . Drink 6-8 cups of fluids each day unless your doctor has told you to limit your fluid intake due to some other health problem. A cup is 8 ounces of fluid. If you throw up or have loose bowel movements, you should drink more fluids so that you do not become dehydrated (lack of water in the body from losing too much fluid).  . If you are not able to move your bowels, check with your doctor or nurse before you use enemas, laxatives, or suppositories  . If you have mouth sores, avoid mouthwash that has alcohol. Also avoid alcohol and smoking because they can bother your mouth and throat.  . If you have a nose bleed, sit with your head tipped slightly forward. Apply pressure by lightly pinching the bridge of your nose between your thumb and forefinger. Call your doctor if you feel dizzy or faint or if the bleeding doesn't stop after 10 to 15 minutes  Important Information  . After treatment with this drug, vaccination with live viruses should be delayed until the immune system recovers.  . Symptoms of abnormal bleeding may be: coughing up blood, throwing up blood (may look like coffee grounds), red or black, tarry bowel movements, blood in urine, abnormally heavy menstrual flow, nosebleeds, or any unusual bleeding.  . Symptoms of high blood pressure may be: headache, blurred vision, confusion, chest pain, or a feeling that your heart is beat differently.  Marland Kitchen Urinary tract infection. Symptoms may include:  . Pain or burning when you pass urine  . Feeling like you have to pass urine often, but not much comes out when you do.  . Tender or heavy feeling in your lower abdomen  . Cloudy urine and/or urine that smells  bad.  . Pain on one side of your back under your ribs. This is where your kidneys are.  . Fever, chills, nausea and/or throwing up  Food and Drug Interactions  There are no known interactions of rituximab and any food. This drug may interact with other medicines. Tell your doctor and pharmacist about all the medicines and dietary supplements (vitamins, minerals, herbs and others) that you are taking at this time. The safety and use of dietary supplements and alternative diets are often not known. Using these might affect your cancer or interfere with your treatment. Until more is known, you should not use dietary supplements or alternative diets without your cancer doctor's help.  When to Call the Doctor  Call your doctor or nurse right away if you have any of these symptoms:  . Fever of 100.5 F (38 C) higher  .  Chills  . Trouble breathing  . Rash with or without itching  . Blistering or peeling of skin  . Chest pain or symptoms of a heart attack. Most heart attacks involve pain in the center of the chest that lasts more than a few minutes. The pain may go away and come back or it can be constant. It can feel like pressure, squeezing, fullness, or pain. Sometimes pain is felt in one or both arms, the back, neck, jaw, or stomach. If any of these symptoms last 2 minutes, call 911  . Easy bleeding or bruising  . Blood in urine or bowel movements  . Feeling that your heart is beating in a fast or not normal way (palpitations)  . Nausea that stops you from eating or drinking  . Throwing up (vomiting) more than 3 times in one day  . Abdominal pain  . Loose bowel movements (diarrhea) 4 times in one day or diarrhea with weakness or lightheadedness  . No bowel movement in 3 days or if you feel uncomfortable  . Feeling dizzy or lightheaded  . Changes in your speech or vision  . Feeling confused  . Weakness of your arms and legs or poor coordination (feeling clumsy)  . Signs of  liver problems: dark urine, pale bowel movements, bad stomach pain, feeling very tired and weak, unusual itching, or yellowing of skin or eyes  . Symptoms of a urinary tract infection (see important information)  Call your doctor or nurse as soon as possible if you have any of these symptoms:  . Swelling of your legs, ankles and/or feet  . Fatigue and /or weakness that interferes with your daily activities  . Joint and muscle pain or muscle spasms that are not relieved by prescribed medicines  . Cough that lasts longer than normal   Reproduction Concerns  . Pregnancy warning: This drug is known to cross the placenta. This drug may have harmful effects on an unborn baby. Effective methods of birth control should be used during treatment with this drug and for 12 months after the last treatment. If exposure occurs to an unborn baby, the baby's immune system may be affected, which could last for months after birth. Until the immune system recovers, live vaccines should not be administered to the baby. Be sure to talk with your doctor if you are pregnant or planning to become pregnant while getting this drug.  . Breast feeding warning: It is not known if rituximab is passed into human breast milk. In animal studies, this drug was detected in in breast milk. For this reason, women should talk to their doctor about the risks and benefits of breast feeding during treatment with this drug because this drug may enter the breast milk and badly harm a breast feeding baby.   SELF CARE ACTIVITIES WHILE ON IMMUNOTHERAPY:  Hydration Increase your fluid intake 48 hours prior to treatment and drink at least 8 to 12 cups (64 ounces) of water/decaffeinated beverages per day after treatment. You can still have your cup of coffee or soda but these beverages do not count as part of your 8 to 12 cups that you need to drink daily. No alcohol intake.  Medications Continue taking your normal prescription  medication as prescribed.  If you start any new herbal or new supplements please let us know first to make sure it is safe.  Mouth Care Have teeth cleaned professionally before starting treatment. Keep dentures and partial plates clean. Use soft toothbrush and  do not use mouthwashes that contain alcohol. Biotene is a good mouthwash that is available at most pharmacies or may be ordered by calling [phone]. Use warm salt water gargles (1 teaspoon salt per 1 quart warm water) before and after meals and at bedtime. Or you may rinse with 2 tablespoons of three-percent hydrogen peroxide mixed in eight ounces of water. If you are still having problems with your mouth or sores in your mouth please call the clinic. If you need dental work, please let the doctor know before you go for your appointment so that we can coordinate the best possible time for you in regards to your chemo regimen. You need to also let your dentist know that you are actively taking chemo. We may need to do labs prior to your dental appointment.  Skin Care Always use sunscreen that has not expired and with SPF (Sun Protection Factor) of 50 or higher. Wear hats to protect your head from the sun. Remember to use sunscreen on your hands, ears, face, & feet.  Use good moisturizing lotions such as udder cream, eucerin, or even Vaseline. Some chemotherapies can cause dry skin, color changes in your skin and nails.    . Avoid long, hot showers or baths. . Use gentle, fragrance-free soaps and laundry detergent. . Use moisturizers, preferably creams or ointments rather than lotions because the thicker consistency is better at preventing skin dehydration. Apply the cream or ointment within 15 minutes of showering. Reapply moisturizer at night, and moisturize your hands every time after you wash them.   Infection Prevention Please wash your hands for at least 30 seconds using warm soapy water. Handwashing is the #1 way to prevent the spread  of germs. Stay away from sick people or people who are getting over a cold. If you develop respiratory systems such as green/yellow mucus production or productive cough or persistent cough let us know and we will see if you need an antibiotic. It is a good idea to keep a pair of gloves on when going into grocery stores/[employer] to decrease your risk of coming into contact with germs on the carts, etc. Carry alcohol hand gel with you at all times and use it frequently if out in public. If your temperature reaches 100.5 or higher please call the clinic and let us know.  If it is after hours or on the weekend please go to the ER if your temperature is over 100.4.  Please have your own personal thermometer at home to use.    Sex and bodily fluids If you are going to have sex, a condom must be used to protect the person that isn't taking immunotherapy. For a few days after treatment, immunotherapy can be excreted through your bodily fluids.  When using the toilet please close the lid and flush the toilet twice.  Do this for a few day after you have had immunotherapy.   Contraception It is not known for sure whether or not immunotherapy drugs can be passed on through semen or secretions from the vagina. Because of this some doctors advise people to use a barrier method if you have sex during treatment. This applies to vaginal, anal or oral sex.  Generally, doctors advise a barrier method only for the time you are actually having the treatment and for about a week after your treatment.  Advice like this can be worrying, but this does not mean that you have to avoid being intimate with your partner. You can still  have close contact with your partner and continue to enjoy sex.  Animals If you have cats or birds we just ask that you not change the litter or change the cage.  Please have someone else do this for you while you are on immunotherapy.   Food Safety During and After Cancer Treatment Food safety is  important for people both during and after cancer treatment. Cancer and cancer treatments, such as chemotherapy, radiation therapy, and stem cell/bone marrow transplantation, often weaken the immune system. This makes it harder for your body to protect itself from foodborne illness, also called food poisoning. Foodborne illness is caused by eating food that contains harmful bacteria, parasites, or viruses.  Foods to avoid Some foods have a higher risk of becoming tainted with bacteria. These include: Marland Kitchen Unwashed fresh fruit and vegetables, especially leafy vegetables that can hide dirt and other contaminants . Raw sprouts, such as alfalfa sprouts . Raw or undercooked beef, especially ground beef, or other raw or undercooked meat and poultry . Fatty, fried, or spicy foods immediately before or after treatment.  These can sit heavy on your stomach and make you feel nauseous. . Raw or undercooked shellfish, such as oysters. . Sushi and sashimi, which often contain raw fish.  . Unpasteurized beverages, such as unpasteurized fruit juices, raw milk, raw yogurt, or cider . Undercooked eggs, such as soft boiled, over easy, and poached; raw, unpasteurized eggs; or foods made with raw egg, such as homemade raw cookie dough and homemade mayonnaise  Simple steps for food safety  Shop smart. . Do not buy food stored or displayed in an unclean area. . Do not buy bruised or damaged fruits or vegetables. . Do not buy cans that have cracks, dents, or bulges. . Pick up foods that can spoil at the end of your shopping trip and store them in a cooler on the way home.  Prepare and clean up foods carefully. . Rinse all fresh fruits and vegetables under running water, and dry them with a clean towel or paper towel. . Clean the top of cans before opening them. . After preparing food, wash your hands for 20 seconds with hot water and soap. Pay special attention to areas between fingers and under nails. . Clean your  utensils and dishes with hot water and soap. Marland Kitchen Disinfect your kitchen and cutting boards using 1 teaspoon of liquid, unscented bleach mixed into 1 quart of water.    Dispose of old food. . Eat canned and packaged food before its expiration date (the "use by" or "best before" date). . Consume refrigerated leftovers within 3 to 4 days. After that time, throw out the food. Even if the food does not smell or look spoiled, it still may be unsafe. Some bacteria, such as Listeria, can grow even on foods stored in the refrigerator if they are kept for too long.  Take precautions when eating out. . At restaurants, avoid buffets and salad bars where food sits out for a long time and comes in contact with many people. Food can become contaminated when someone with a virus, often a norovirus, or another "bug" handles it. . Put any leftover food in a "to-go" container yourself, rather than having the server do it. And, refrigerate leftovers as soon as you get home. . Choose restaurants that are clean and that are willing to prepare your food as you order it cooked.    SYMPTOMS TO REPORT AS SOON AS POSSIBLE AFTER TREATMENT:   FEVER  GREATER THAN 100.5 F  CHILLS WITH OR WITHOUT FEVER  NAUSEA AND VOMITING THAT IS NOT CONTROLLED WITH YOUR NAUSEA MEDICATION  UNUSUAL SHORTNESS OF BREATH  UNUSUAL BRUISING OR BLEEDING  TENDERNESS IN MOUTH AND THROAT WITH OR WITHOUT PRESENCE OF ULCERS  URINARY PROBLEMS  BOWEL PROBLEMS  UNUSUAL RASH     Wear comfortable clothing and clothing appropriate for easy access to any Portacath or PICC line. Let us know if there is anything that we can do to make your therapy better!    What to do if you need assistance after hours or on the weekends: CALL [phone].  HOLD on the line, do not hang up.  You will hear multiple messages but at the end you will be connected with a nurse triage line.  They will contact the doctor if necessary.  Most of the time they will be  able to assist you.  Do not call the hospital operator.      I have been informed and understand all of the instructions given to me and have received a copy. I have been instructed to call the clinic [phone] or my family physician as soon as possible for continued medical care, if indicated. I do not have any more questions at this time but understand that I may call the [facility] or the Patient Navigator at [phone] during office hours should I have questions or need assistance in obtaining follow-up care.

## 2019-10-18 NOTE — Progress Notes (Signed)
[facility] Initial Psychosocial Assessment Clinical Social Work  Clinical Social Work contacted by phone to assess psychosocial, emotional, mental health, and spiritual needs of the patient.   Barriers to care/review of distress screen:  - Transportation:  Do you anticipate any problems getting to appointments?  Do you have someone who can help run errands for you if you need it?  Hopes to drive herself to appointments, has help if needed - Help at home:  What is your living situation (alone, family, other)?  If you are physically unable to care for yourself, who would you call on to help you?  Lives w husband and children (daughter - 49 and son - 63).  Daughter drives so can help w transportation if needed.   - Support system:  What does your support system look like?  Who would you call on if you needed some kind of practical help?  What if you needed someone to talk to for emotional support?  Primarily her local family.  Also has neighbors who will help if needed - especially w practical needs.  Extended family lives out of town - talks w mother.  - Finances:  Are you concerned about finances.  Considering returning to work?  If not, applying for disability?  Currently working full time for [employer] and hopes to continue throughout treatment.  Started this job July 2020, so is not eligible for FMLA yet.  Supervisor has been supportive, but job is not able to be flexible in schedule/demands.  Unsure whether she has short term disability benefits.  Has not finalized plan for working during treatment - unsure of what she will actually need and how she will feel during treatment.  Recommended she gather information from [employer], a national nonprofit dedicated to supporting people working while in treatment.    What is your understanding of where you are with your cancer? Its cause?  Your treatment plan and what happens next?  Initially sought medical attention when she had some difficulty  swallowing in July 2020. Thought this was related to GERD, had tests and medications prescribed.  In course of investigation/work up had endoscopy in Dec 2020 - incidental finding of cancerous area in small intestine unrelated to swallowing issues.  Diagnosed with Stage I AE duodenal follicular lymphoma.  Has had persistent sense of feeling "unwell" and struggling w increased fatigue.  Sought additional evaluation from oncologist at [facility], due to these symptoms recommended tx w rituximab vs "watch and wait".  She will start this 10/27/19 at [facility].  If Distress Screen is positive for depression, insert PHQ   If Distress Screen is positive for anxiety, insert GAD 7  What are your worries for the future as you begin treatment for cancer?  Wants to feel heard and her concerns taken seriously.  Has had past experience where she had to advocate for herself or children where she thought providers were not considering possible more serious conditions - she/children actually did have a more serious issue than originally thought.  Sometimes she may have difficulty trusting medical providers - especially when they do not follow through with things they have said they will do.  She is concerned about an abnormal finding on her kidney and has a family history of kidney disease - she would like a referral for this or to clearly understand why this is not necessary at this time.  Children have expressed concerns that she will lose her hair or family will become "poor" due to  her treatment for cancer.  Overall children seem to have handled her diagnosis well - have brought questions and concerns to parents and she has encouraged open communication.    What are your hopes and priorities during your treatment? What is important to you? What are your goals for your care?  Good communication and follow through from treatment team.  Would like to be able to coordinate work and appointments as much as possible, appreciates advance  notice of appointments.    CSW Summary:  Patient and family psychosocial functioning including strengths, limitations, and coping skills:  34 yo female diagnosed w lymphoma as an incidental finding during endoscopy.  Works full time, married w two teenage children.  Hopeful that treatment will result in cure and that she can resume life as normal w more energy than at present.  CSW and patient discussed common feeling and emotions when being diagnosed with cancer, and the importance of support during treatment. CSW informed patient of the support team and support services at [facility]. CSW provided contact information and encouraged patient to call with any questions or concerns.  Will email copy of calendar, encouraged participation.   Identifications of barriers to care: No FMLA as she has not been at current workplace 12 months.    Availability of community resources:  [place] Worker follow up needed: No.  Please recontact as needed.  Edwyna Shell, LCSW Clinical Social Worker Phone:  [phone] Cell:  [phone]

## 2019-10-24 ENCOUNTER — Other Ambulatory Visit: Payer: BC Managed Care – PPO

## 2019-10-26 NOTE — Progress Notes (Signed)
.   Pharmacist Chemotherapy Monitoring - Initial Assessment    Anticipated start date: 10/27/19   Regimen:  . Are orders appropriate based on the patient's diagnosis, regimen, and cycle? Yes . Does the plan date match the patient's scheduled date? Yes . Is the sequencing of drugs appropriate? Yes . Are the premedications appropriate for the patient's regimen? Yes . Prior Authorization for treatment is: Approved o If applicable, is the correct biosimilar selected based on the patient's insurance? yes  Organ Function and Labs: Marland Kitchen Are dose adjustments needed based on the patient's renal function, hepatic function, or hematologic function? No . Are appropriate labs ordered prior to the start of patient's treatment? Yes . Other organ system assessment, if indicated: N/A . The following baseline labs, if indicated, have been ordered: rituximab: baseline Hepatitis B labs  Dose Assessment: . Are the drug doses appropriate? Yes . Are the following correct: o Drug concentrations Yes o IV fluid compatible with drug Yes o Administration routes Yes o Timing of therapy Yes . If applicable, does the patient have documented access for treatment and/or plans for port-a-cath placement? no . If applicable, have lifetime cumulative doses been properly documented and assessed? not applicable Lifetime Dose Tracking  No doses have been documented on this patient for the following tracked chemicals: Doxorubicin, Epirubicin, Idarubicin, Daunorubicin, Mitoxantrone, Bleomycin, Oxaliplatin, Carboplatin, Liposomal Doxorubicin  o   Toxicity Monitoring/Prevention: . The patient has the following take home antiemetics prescribed: N/A . The patient has the following take home medications prescribed: hypersensitivity pre-meds  . Medication allergies and previous infusion related reactions, if applicable, have been reviewed and addressed. Yes . The patient's current medication list has been assessed for drug-drug  interactions with their chemotherapy regimen. no significant drug-drug interactions were identified on review.  Order Review: . Are the treatment plan orders signed? No . Is the patient scheduled to see a provider prior to their treatment? Yes  I verify that I have reviewed each item in the above checklist and answered each question accordingly.  Wynona Neat 10/26/2019 2:58 PM

## 2019-10-27 ENCOUNTER — Encounter (HOSPITAL_COMMUNITY): Payer: Self-pay | Admitting: Hematology

## 2019-10-27 ENCOUNTER — Other Ambulatory Visit: Payer: Self-pay

## 2019-10-27 ENCOUNTER — Inpatient Hospital Stay (HOSPITAL_COMMUNITY): Payer: BC Managed Care – PPO | Attending: Hematology | Admitting: Hematology

## 2019-10-27 ENCOUNTER — Inpatient Hospital Stay (HOSPITAL_COMMUNITY): Payer: BC Managed Care – PPO

## 2019-10-27 VITALS — BP 140/86 | HR 80 | Temp 98.6°F | Resp 18 | Wt 201.6 lb

## 2019-10-27 VITALS — BP 125/78 | HR 88 | Temp 98.8°F | Resp 16

## 2019-10-27 DIAGNOSIS — R6883 Chills (without fever): Secondary | ICD-10-CM | POA: Insufficient documentation

## 2019-10-27 DIAGNOSIS — Z79899 Other long term (current) drug therapy: Secondary | ICD-10-CM | POA: Insufficient documentation

## 2019-10-27 DIAGNOSIS — R110 Nausea: Secondary | ICD-10-CM | POA: Insufficient documentation

## 2019-10-27 DIAGNOSIS — E063 Autoimmune thyroiditis: Secondary | ICD-10-CM | POA: Insufficient documentation

## 2019-10-27 DIAGNOSIS — R6889 Other general symptoms and signs: Secondary | ICD-10-CM | POA: Diagnosis not present

## 2019-10-27 DIAGNOSIS — C8209 Follicular lymphoma grade I, extranodal and solid organ sites: Secondary | ICD-10-CM

## 2019-10-27 DIAGNOSIS — C8299 Follicular lymphoma, unspecified, extranodal and solid organ sites: Secondary | ICD-10-CM | POA: Insufficient documentation

## 2019-10-27 DIAGNOSIS — Z5112 Encounter for antineoplastic immunotherapy: Secondary | ICD-10-CM | POA: Diagnosis not present

## 2019-10-27 LAB — COMPREHENSIVE METABOLIC PANEL
ALT: 19 U/L (ref 0–44)
AST: 20 U/L (ref 15–41)
Albumin: 4.3 g/dL (ref 3.5–5.0)
Alkaline Phosphatase: 68 U/L (ref 38–126)
Anion gap: 9 (ref 5–15)
BUN: 11 mg/dL (ref 6–20)
CO2: 26 mmol/L (ref 22–32)
Calcium: 9.4 mg/dL (ref 8.9–10.3)
Chloride: 106 mmol/L (ref 98–111)
Creatinine, Ser: 0.6 mg/dL (ref 0.44–1.00)
GFR calc Af Amer: >60 mL/min (ref >60)
GFR calc non Af Amer: >60 mL/min (ref >60)
Glucose, Bld: 98 mg/dL (ref 70–99)
Potassium: 3.9 mmol/L (ref 3.5–5.1)
Sodium: 141 mmol/L (ref 135–145)
Total Bilirubin: 1 mg/dL (ref 0.3–1.2)
Total Protein: 7.3 g/dL (ref 6.5–8.1)

## 2019-10-27 LAB — CBC WITH DIFFERENTIAL/PLATELET
Abs Immature Granulocytes: 0.02 10*3/uL (ref 0.00–0.07)
Basophils Absolute: 0.1 10*3/uL (ref 0.0–0.1)
Basophils Relative: 1 %
Eosinophils Absolute: 0.2 10*3/uL (ref 0.0–0.5)
Eosinophils Relative: 3 %
HCT: 40.3 % (ref 36.0–46.0)
Hemoglobin: 13.2 g/dL (ref 12.0–15.0)
Immature Granulocytes: 0 %
Lymphocytes Relative: 30 %
Lymphs Abs: 2 10*3/uL (ref 0.7–4.0)
MCH: 27.8 pg (ref 26.0–34.0)
MCHC: 32.8 g/dL (ref 30.0–36.0)
MCV: 84.8 fL (ref 80.0–100.0)
Monocytes Absolute: 0.5 10*3/uL (ref 0.1–1.0)
Monocytes Relative: 8 %
Neutro Abs: 4 10*3/uL (ref 1.7–7.7)
Neutrophils Relative %: 58 %
Platelets: 196 10*3/uL (ref 150–400)
RBC: 4.75 MIL/uL (ref 3.87–5.11)
RDW: 12.4 % (ref 11.5–15.5)
WBC: 6.8 10*3/uL (ref 4.0–10.5)
nRBC: 0 % (ref 0.0–0.2)

## 2019-10-27 LAB — LACTATE DEHYDROGENASE: LDH: 111 U/L (ref 98–192)

## 2019-10-27 LAB — URIC ACID: Uric Acid, Serum: 4.4 mg/dL (ref 2.5–7.1)

## 2019-10-27 MED ORDER — SODIUM CHLORIDE 0.9 % IV SOLN: Freq: Once | INTRAVENOUS | Status: AC

## 2019-10-27 MED ORDER — ACETAMINOPHEN 325 MG PO TABS
650.0000 mg | ORAL_TABLET | Freq: Once | ORAL | Status: AC
  Administered 2019-10-27: 650 mg via ORAL

## 2019-10-27 MED ORDER — ONDANSETRON HCL 4 MG/2ML IJ SOLN
8.0000 mg | Freq: Once | INTRAMUSCULAR | Status: AC
  Administered 2019-10-27: 8 mg via INTRAVENOUS

## 2019-10-27 MED ORDER — ONDANSETRON HCL 4 MG/2ML IJ SOLN
INTRAMUSCULAR | Status: AC
  Filled 2019-10-27: qty 4

## 2019-10-27 MED ORDER — DIPHENHYDRAMINE HCL 50 MG/ML IJ SOLN
50.0000 mg | Freq: Once | INTRAMUSCULAR | Status: AC
  Administered 2019-10-27: 50 mg via INTRAVENOUS
  Filled 2019-10-27: qty 1

## 2019-10-27 MED ORDER — MEPERIDINE HCL 50 MG/ML IJ SOLN
50.0000 mg | Freq: Once | INTRAMUSCULAR | Status: AC
  Administered 2019-10-27: 50 mg via INTRAVENOUS

## 2019-10-27 MED ORDER — FAMOTIDINE IN NACL 20-0.9 MG/50ML-% IV SOLN
20.0000 mg | Freq: Once | INTRAVENOUS | Status: AC
  Administered 2019-10-27: 20 mg via INTRAVENOUS
  Filled 2019-10-27: qty 50

## 2019-10-27 MED ORDER — ACETAMINOPHEN 325 MG PO TABS
ORAL_TABLET | ORAL | Status: AC
  Filled 2019-10-27: qty 2

## 2019-10-27 MED ORDER — ACETAMINOPHEN 325 MG PO TABS
650.0000 mg | ORAL_TABLET | Freq: Once | ORAL | Status: AC
  Administered 2019-10-27: 650 mg via ORAL
  Filled 2019-10-27: qty 2

## 2019-10-27 MED ORDER — SODIUM CHLORIDE 0.9 % IV SOLN
375.0000 mg/m2 | Freq: Once | INTRAVENOUS | Status: AC
  Administered 2019-10-27: 800 mg via INTRAVENOUS
  Filled 2019-10-27: qty 50

## 2019-10-27 MED ORDER — MEPERIDINE HCL 50 MG/ML IJ SOLN
INTRAMUSCULAR | Status: AC
  Filled 2019-10-27: qty 1

## 2019-10-27 NOTE — Assessment & Plan Note (Signed)
1.  Stage I AE duodenal follicular lymphoma: -EGD on 07/13/2019 shows patchy areas of whitish, nodular mucosa in second part of the duodenum, biopsy consistent with grade 7-6/5 follicular lymphoma. -PET scan on 07/25/2019-very mild hypermetabolic along the second portion of the duodenum, maximum SUV 3.6. -No B symptoms.  She lost some weight due to digestive problems. -Bone marrow biopsy on 08/02/2019 shows normocellular marrow with trilineage hematopoiesis. -She has acid reflux symptoms which improved mildly after starting gluten-free diet.  Continues to have nausea and diarrhea. -I have reviewed her labs today which shows normal white count and platelet count.  LDH and LFTs were normal. -We talked about starting her rituximab today.  We discussed the side effects in detail.  She will proceed with her treatment.  We will see her back next week.  Would repeat EGD and biopsy at least 3 to 6 months after completion of treatment of rituximab.  Will do scans in 6 months.  2.  Hashimoto's thyroiditis: -She is on Synthroid 100 mcg daily.  3.  Family history: -Paternal uncle had DLBCL.  Paternal grandmother had leukemia.  Paternal grandfather had colon cancer. -Maternal great aunt had breast cancer.  Maternal great grandmother had ovarian cancer.  Mother had ITP.  Addendum: -After her rate of rituximab increased to 100, she developed rigors and chills. -We have held her rituximab.  She was given Demerol 50 mg intravenously.  Later she developed nausea.  We have given her Zofran 8 mg IV. -Subsequently at 200 mg/hr rate, she developed increasing heart rate to 90s.  She was also slightly anxious. -She has successfully completed rituximab infusion around 4:15 PM.

## 2019-10-27 NOTE — Patient Instructions (Addendum)
[place] at [facility] Discharge Instructions  You were seen today by Dr. Delton Coombes. He went over your recent lab results. He will see you back in one week for labs and follow up.   Thank you for choosing [place] at [facility] to provide your oncology and hematology care.  To afford each patient quality time with our provider, please arrive at least 15 minutes before your scheduled appointment time.   If you have a lab appointment with the [facility] please come in thru the  Main Entrance and check in at the main information desk  You need to re-schedule your appointment should you arrive 10 or more minutes late.  We strive to give you quality time with our providers, and arriving late affects you and other patients whose appointments are after yours.  Also, if you no show three or more times for appointments you may be dismissed from the clinic at the providers discretion.     Again, thank you for choosing [facility].  Our hope is that these requests will decrease the amount of time that you wait before being seen by our physicians.       _____________________________________________________________  Should you have questions after your visit to [facility], please contact our office at [phone] between the hours of 8:00 a.m. and 4:30 p.m.  Voicemails left after 4:00 p.m. will not be returned until the following business day.  For prescription refill requests, have your pharmacy contact our office and allow 72 hours.    Cancer Center Support Programs:   > Cancer Support Group  2nd Tuesday of the month 1pm-2pm, Journey Room

## 2019-10-27 NOTE — Progress Notes (Signed)
Patient has been assessed, vital signs and labs have been reviewed by Dr. Katragadda. ANC, Creatinine, LFTs, and Platelets are within treatment parameters per Dr. Katragadda. The patient is good to proceed with treatment at this time.  

## 2019-10-27 NOTE — Patient Instructions (Signed)
[facility] Discharge Instructions for Patients Receiving Chemotherapy  Today you received the following chemotherapy agents   To help prevent nausea and vomiting after your treatment, we encourage you to take your nausea medication   If you develop nausea and vomiting that is not controlled by your nausea medication, call the clinic.   BELOW ARE SYMPTOMS THAT SHOULD BE REPORTED IMMEDIATELY:  *FEVER GREATER THAN 100.5 F  *CHILLS WITH OR WITHOUT FEVER  NAUSEA AND VOMITING THAT IS NOT CONTROLLED WITH YOUR NAUSEA MEDICATION  *UNUSUAL SHORTNESS OF BREATH  *UNUSUAL BRUISING OR BLEEDING  TENDERNESS IN MOUTH AND THROAT WITH OR WITHOUT PRESENCE OF ULCERS  *URINARY PROBLEMS  *BOWEL PROBLEMS  UNUSUAL RASH Items with * indicate a potential emergency and should be followed up as soon as possible.  Feel free to call the clinic should you have any questions or concerns. The clinic phone number is [phone].  Please show the CHEMO ALERT CARD at check-in to the Emergency Department and triage nurse.   

## 2019-10-27 NOTE — Progress Notes (Signed)
[address] [place], [place] [address]   CLINIC:  Medical Oncology/Hematology  PCP:  Dettinger, Fransisca Kaufmann, MD [place] [address] [phone]   REASON FOR VISIT:  Low-grade follicular lymphoma of the duodenum.  CURRENT THERAPY: Weekly rituximab x4    INTERVAL HISTORY:  [name] 34 y.o. female seen for follow-up of low-grade lymphoma involving the duodenum.  Reports appetite 75%.  Energy levels are 25%.  Occasional diarrhea present.  Denies any fevers or chills.  No weight loss reported.    REVIEW OF SYSTEMS:  Review of Systems  Gastrointestinal: Positive for diarrhea.  All other systems reviewed and are negative.    PAST MEDICAL/SURGICAL HISTORY:  Past Medical History:  Diagnosis Date  . Allergy   . Cancer ([place]) 53/6644   follicular lymphoma  . Dysphagia   . GERD (gastroesophageal reflux disease)   . Thyroid disease    Hypothyroidism  . Thyroid nodule 2020   Past Surgical History:  Procedure Laterality Date  . ABDOMINAL HYSTERECTOMY    . ESOPHAGOGASTRODUODENOSCOPY N/A 07/13/2019   Procedure: ESOPHAGOGASTRODUODENOSCOPY (EGD);  Surgeon: Daneil Dolin, MD; normal esophagus s/p dilation and biopsied, normal stomach, patchy areas of whitish, nodular mucosa in the second portion of the duodenum s/p biopsy.  Duodenal biopsy with low-grade follicular lymphoma.  Esophageal biopsies benign without increased intraepithelial eosinophils.  Marland Kitchen KIDNEY STONE SURGERY    . MALONEY DILATION N/A 07/13/2019   Procedure: Venia Minks DILATION;  Surgeon: Daneil Dolin, MD;  Location: [facility];  Service: Endoscopy;  Laterality: N/A;  . TONSILLECTOMY     Around age 38  . WISDOM TOOTH EXTRACTION       SOCIAL HISTORY:  Social History   Socioeconomic History  . Marital status: Married    Spouse name: Not on file  . Number of children: 2  . Years of education: Not on file  . Highest education level: Not on file  Occupational History     Employer: [employer]  Tobacco Use  . Smoking status: Never Smoker  . Smokeless tobacco: Never Used  Substance and Sexual Activity  . Alcohol use: Yes    Comment: occasional- maybe once a month  . Drug use: No  . Sexual activity: Yes    Birth control/protection: Surgical    Comment: married 14 years  Other Topics Concern  . Not on file  Social History Narrative  . Not on file   Social Determinants of Health   Financial Resource Strain: Low Risk   . Difficulty of Paying Living Expenses: Not hard at all  Food Insecurity: No Food Insecurity  . Worried About Charity fundraiser in the Last Year: Never true  . Ran Out of Food in the Last Year: Never true  Transportation Needs: No Transportation Needs  . Lack of Transportation (Medical): No  . Lack of Transportation (Non-Medical): No  Physical Activity: Insufficiently Active  . Days of Exercise per Week: 5 days  . Minutes of Exercise per Session: 20 min  Stress: Stress Concern Present  . Feeling of Stress : To some extent  Social Connections: Somewhat Isolated  . Frequency of Communication with Friends and Family: More than three times a week  . Frequency of Social Gatherings with Friends and Family: More than three times a week  . Attends Religious Services: Never  . Active Member of Clubs or Organizations: No  . Attends Archivist Meetings: Never  . Marital Status: Married  Intimate Partner Violence: Not At Risk  . Fear of Current or Ex-Partner: No  . Emotionally Abused: No  . Physically Abused: No  . Sexually Abused: No    FAMILY HISTORY:  Family History  Problem Relation Age of Onset  . Thrombocytopenia Mother        itp  . Heart disease Mother   . Hypertension Mother   . Seizures Brother   . ADD / ADHD Brother   . Colon cancer Paternal Grandfather        in 42s  . Diabetes Maternal Grandmother   . Stroke Maternal Grandmother   . Heart disease Maternal Grandfather   . Stroke Maternal  Grandfather   . Hypothyroidism Paternal Grandmother   . Psoriasis Brother   . Asthma Daughter   . Kidney disease Maternal Uncle   . Non-Hodgkin's lymphoma Paternal Uncle   . Leukemia Paternal Great-grandmother   . Ovarian cancer Maternal Great-grandmother   . Breast cancer Other        maternal great aunt  . Colon polyps Neg Hx     CURRENT MEDICATIONS:  Outpatient Encounter Medications as of 10/27/2019  Medication Sig  . fexofenadine (ALLEGRA) 180 MG tablet Take 180 mg by mouth daily as needed for allergies.   . Multiple Vitamins-Minerals (MULTIVITAMIN ADULT PO) Take 1 tablet by mouth daily.   . pantoprazole (PROTONIX) 40 MG tablet Take 1 tablet (40 mg total) by mouth 2 (two) times daily before a meal.  . riTUXimab in sodium chloride 0.9 % 250 mL Inject into the vein once a week. Weekly x 4 cycles  . SYNTHROID 100 MCG tablet Take 100 mcg by mouth every morning.   No facility-administered encounter medications on file as of 10/27/2019.    ALLERGIES:  Allergies  Allergen Reactions  . Latex Rash     PHYSICAL EXAM:  ECOG Performance status: 0  Vitals:   10/27/19 0829  BP: 140/86  Pulse: 80  Resp: 18  Temp: 98.6 F (37 C)  SpO2: 100%   Filed Weights   10/27/19 0829  Weight: 201 lb 9.6 oz (91.4 kg)    Physical Exam Vitals reviewed.  Constitutional:      Appearance: Normal appearance.  Cardiovascular:     Rate and Rhythm: Normal rate and regular rhythm.  Pulmonary:     Breath sounds: Normal breath sounds.  Abdominal:     General: There is no distension.     Palpations: Abdomen is soft. There is no mass.  Lymphadenopathy:     Cervical: No cervical adenopathy.  Skin:    General: Skin is warm.  Neurological:     General: No focal deficit present.     Mental Status: She is alert and oriented to person, place, and time.  Psychiatric:        Mood and Affect: Mood normal.        Behavior: Behavior normal.      LABORATORY DATA:  I have reviewed the labs as  listed.  CBC    Component Value Date/Time   WBC 6.8 10/27/2019 0812   RBC 4.75 10/27/2019 0812   HGB 13.2 10/27/2019 0812   HGB 13.1 12/04/2017 1012   HCT 40.3 10/27/2019 0812   HCT 37.8 12/04/2017 1012   PLT 196 10/27/2019 0812   PLT 202 12/04/2017 1012   MCV 84.8 10/27/2019 0812   MCV 82 12/04/2017 1012   MCH 27.8 10/27/2019 0812   MCHC 32.8 10/27/2019 0812   RDW 12.4 10/27/2019 0812  RDW 13.3 12/04/2017 1012   LYMPHSABS 2.0 10/27/2019 0812   LYMPHSABS 2.3 12/04/2017 1012   MONOABS 0.5 10/27/2019 0812   EOSABS 0.2 10/27/2019 0812   EOSABS 0.2 12/04/2017 1012   BASOSABS 0.1 10/27/2019 0812   BASOSABS 0.0 12/04/2017 1012   CMP Latest Ref Rng & Units 10/27/2019 04/05/2019 06/30/2018  Glucose 70 - 99 mg/dL 98 81 110(H)  BUN 6 - 20 mg/dL _0 Creatinine 0.44 - 1.00 mg/dL 0.60 0.81 0.78  Sodium 135 - 145 mmol/L 141 139 140  Potassium 3.5 - 5.1 mmol/L 3.9 4.7 4.2  Chloride 98 - 111 mmol/L 106 103 105  CO2 22 - 32 mmol/L _1 Calcium 8.9 - 10.3 mg/dL 9.4 9.9 9.2  Total Protein 6.5 - 8.1 g/dL 7.3 7.1 -  Total Bilirubin 0.3 - 1.2 mg/dL 1.0 0.8 -  Alkaline Phos 38 - 126 U/L 68 81 -  AST 15 - 41 U/L 20 22 -  ALT 0 - 44 U/L 19 15 -       DIAGNOSTIC IMAGING:  I have independently reviewed scans.     ASSESSMENT & PLAN:   Follicular lymphoma ([place]) 1.  Stage I AE duodenal follicular lymphoma: -EGD on 07/13/2019 shows patchy areas of whitish, nodular mucosa in second part of the duodenum, biopsy consistent with grade 4-9/4 follicular lymphoma. -PET scan on 07/25/2019-very mild hypermetabolic along the second portion of the duodenum, maximum SUV 3.6. -No B symptoms.  She lost some weight due to digestive problems. -Bone marrow biopsy on 08/02/2019 shows normocellular marrow with trilineage hematopoiesis. -She has acid reflux symptoms which improved mildly after starting gluten-free diet.  Continues to have nausea and diarrhea. -I have reviewed her labs today which shows  normal white count and platelet count.  LDH and LFTs were normal. -We talked about starting her rituximab today.  We discussed the side effects in detail.  She will proceed with her treatment.  We will see her back next week.  Would repeat EGD and biopsy at least 3 to 6 months after completion of treatment of rituximab.  Will do scans in 6 months.  2.  Hashimoto's thyroiditis: -She is on Synthroid 100 mcg daily.  3.  Family history: -Paternal uncle had DLBCL.  Paternal grandmother had leukemia.  Paternal grandfather had colon cancer. -Maternal great aunt had breast cancer.  Maternal great grandmother had ovarian cancer.  Mother had ITP.  Addendum: -After her rate of rituximab increased to 100, she developed rigors and chills. -We have held her rituximab.  She was given Demerol 50 mg intravenously.  Later she developed nausea.  We have given her Zofran 8 mg IV. -Subsequently at 200 mg/hr rate, she developed increasing heart rate to 90s.  She was also slightly anxious. -She has successfully completed rituximab infusion around 4:15 PM.      Orders placed this encounter:  Orders Placed This Encounter  Procedures  . Lactate dehydrogenase  . Uric acid   Total time spent is 40 minutes including the time spent managing her allergic reaction.   Derek Jack, MD [place] [phone]

## 2019-10-27 NOTE — Progress Notes (Signed)
1200-patient called out for a nurse, as we enter room, patient was shivering, immediately stopped rituxan. Normal saline infusing only. 1201-MD to room, vitals obtained, verbal order for demerol 50 mg ordered, 1208 patient complained of nausea, verbal order for zofran 8 mg per MD.    1215-MD in room to re-evaluate patient. Verbal order to restart Rituxan at the rate of 12m/hr.  1500-patient complained of feeling achy all over her body. MD aware, will give 2 additional tylenol per MD. Temp 98.8, MD aware.  Treatment given per orders. Patient tolerated it well without problems. Vitals stable and discharged home from clinic ambulatory. Follow up as scheduled.

## 2019-10-28 ENCOUNTER — Telehealth (HOSPITAL_COMMUNITY): Payer: Self-pay

## 2019-10-28 NOTE — Progress Notes (Signed)
Immunotherapy education packet given and discussed with pt in detail.  Discussed diagnosis, staging, tx regimen, and intent of tx.  Reviewed immunotherapy medications and side effects.  Instructed on how to manage side effects at home, and when to call the clinic.  Importance of fever/chills discussed with pt and family. Discussed precautions to implement at home after receiving tx, as well as self care strategies. Phone numbers provided for clinic during regular working hours, also how to reach the clinic after hours and on weekends. Pt provided the opportunity to ask questions - all questions answered to pt's satisfaction.

## 2019-10-28 NOTE — Telephone Encounter (Signed)
24 hour follow up-patient states she feels better than yesterday. She has eaten a little food. No nausea or vomiting. She stated she ran a fever last night 99.9. she did take tylenol for that. Patient states she is a little tired today but mostly overall feeling ok today.

## 2019-11-01 NOTE — Progress Notes (Signed)
.  Pharmacist Chemotherapy Monitoring - Follow Up Assessment    I verify that I have reviewed each item in the below checklist:  . Regimen for the patient is scheduled for the appropriate day and plan matches scheduled date. Marland Kitchen Appropriate non-routine labs are ordered dependent on drug ordered. . If applicable, additional medications reviewed and ordered per protocol based on lifetime cumulative doses and/or treatment regimen.   Plan for follow-up and/or issues identified: No . I-vent associated with next due treatment: No . MD and/or nursing notified: No  . Had a reaction first dose asking for additional pre-meds.  Wynona Neat 11/01/2019 4:00 PM

## 2019-11-04 ENCOUNTER — Encounter (HOSPITAL_COMMUNITY): Payer: Self-pay | Admitting: Nurse Practitioner

## 2019-11-04 ENCOUNTER — Other Ambulatory Visit (HOSPITAL_COMMUNITY): Payer: Self-pay | Admitting: Nurse Practitioner

## 2019-11-04 ENCOUNTER — Inpatient Hospital Stay (HOSPITAL_COMMUNITY): Payer: BC Managed Care – PPO

## 2019-11-04 ENCOUNTER — Inpatient Hospital Stay (HOSPITAL_BASED_OUTPATIENT_CLINIC_OR_DEPARTMENT_OTHER): Payer: BC Managed Care – PPO | Admitting: Nurse Practitioner

## 2019-11-04 ENCOUNTER — Other Ambulatory Visit: Payer: Self-pay

## 2019-11-04 VITALS — BP 131/69 | HR 74 | Temp 96.9°F | Resp 18

## 2019-11-04 DIAGNOSIS — C8209 Follicular lymphoma grade I, extranodal and solid organ sites: Secondary | ICD-10-CM

## 2019-11-04 DIAGNOSIS — R6883 Chills (without fever): Secondary | ICD-10-CM | POA: Diagnosis not present

## 2019-11-04 DIAGNOSIS — Z79899 Other long term (current) drug therapy: Secondary | ICD-10-CM | POA: Diagnosis not present

## 2019-11-04 DIAGNOSIS — C8299 Follicular lymphoma, unspecified, extranodal and solid organ sites: Secondary | ICD-10-CM | POA: Diagnosis not present

## 2019-11-04 DIAGNOSIS — E063 Autoimmune thyroiditis: Secondary | ICD-10-CM | POA: Diagnosis not present

## 2019-11-04 DIAGNOSIS — R110 Nausea: Secondary | ICD-10-CM | POA: Diagnosis not present

## 2019-11-04 DIAGNOSIS — R6889 Other general symptoms and signs: Secondary | ICD-10-CM | POA: Diagnosis not present

## 2019-11-04 DIAGNOSIS — Z5112 Encounter for antineoplastic immunotherapy: Secondary | ICD-10-CM | POA: Diagnosis not present

## 2019-11-04 LAB — CBC WITH DIFFERENTIAL/PLATELET
Abs Immature Granulocytes: 0.03 10*3/uL (ref 0.00–0.07)
Basophils Absolute: 0.1 10*3/uL (ref 0.0–0.1)
Basophils Relative: 1 %
Eosinophils Absolute: 0.2 10*3/uL (ref 0.0–0.5)
Eosinophils Relative: 3 %
HCT: 41.1 % (ref 36.0–46.0)
Hemoglobin: 13.4 g/dL (ref 12.0–15.0)
Immature Granulocytes: 0 %
Lymphocytes Relative: 26 %
Lymphs Abs: 2 10*3/uL (ref 0.7–4.0)
MCH: 28 pg (ref 26.0–34.0)
MCHC: 32.6 g/dL (ref 30.0–36.0)
MCV: 86 fL (ref 80.0–100.0)
Monocytes Absolute: 0.7 10*3/uL (ref 0.1–1.0)
Monocytes Relative: 9 %
Neutro Abs: 4.5 10*3/uL (ref 1.7–7.7)
Neutrophils Relative %: 61 %
Platelets: 218 10*3/uL (ref 150–400)
RBC: 4.78 MIL/uL (ref 3.87–5.11)
RDW: 12.4 % (ref 11.5–15.5)
WBC: 7.5 10*3/uL (ref 4.0–10.5)
nRBC: 0 % (ref 0.0–0.2)

## 2019-11-04 LAB — COMPREHENSIVE METABOLIC PANEL
ALT: 48 U/L — ABNORMAL HIGH (ref 0–44)
AST: 32 U/L (ref 15–41)
Albumin: 4.3 g/dL (ref 3.5–5.0)
Alkaline Phosphatase: 67 U/L (ref 38–126)
Anion gap: 10 (ref 5–15)
BUN: 12 mg/dL (ref 6–20)
CO2: 26 mmol/L (ref 22–32)
Calcium: 9.7 mg/dL (ref 8.9–10.3)
Chloride: 103 mmol/L (ref 98–111)
Creatinine, Ser: 0.68 mg/dL (ref 0.44–1.00)
GFR calc Af Amer: >60 mL/min (ref >60)
GFR calc non Af Amer: >60 mL/min (ref >60)
Glucose, Bld: 83 mg/dL (ref 70–99)
Potassium: 4.2 mmol/L (ref 3.5–5.1)
Sodium: 139 mmol/L (ref 135–145)
Total Bilirubin: 0.8 mg/dL (ref 0.3–1.2)
Total Protein: 7.6 g/dL (ref 6.5–8.1)

## 2019-11-04 LAB — LACTATE DEHYDROGENASE: LDH: 129 U/L (ref 98–192)

## 2019-11-04 LAB — URIC ACID: Uric Acid, Serum: 5.7 mg/dL (ref 2.5–7.1)

## 2019-11-04 MED ORDER — MEPERIDINE HCL 25 MG/ML IJ SOLN
50.0000 mg | Freq: Once | INTRAMUSCULAR | Status: DC | PRN
  Filled 2019-11-04: qty 2

## 2019-11-04 MED ORDER — MEPERIDINE HCL 50 MG/ML IJ SOLN
INTRAMUSCULAR | Status: AC
  Filled 2019-11-04: qty 1

## 2019-11-04 MED ORDER — DIPHENHYDRAMINE HCL 50 MG/ML IJ SOLN
INTRAMUSCULAR | Status: AC
  Filled 2019-11-04: qty 1

## 2019-11-04 MED ORDER — MEPERIDINE HCL 50 MG/ML IJ SOLN
50.0000 mg | Freq: Once | INTRAMUSCULAR | Status: AC
  Administered 2019-11-04: 50 mg via INTRAVENOUS

## 2019-11-04 MED ORDER — METHYLPREDNISOLONE SODIUM SUCC 125 MG IJ SOLR
80.0000 mg | Freq: Once | INTRAMUSCULAR | Status: AC
  Administered 2019-11-04: 80 mg via INTRAVENOUS

## 2019-11-04 MED ORDER — METHYLPREDNISOLONE SODIUM SUCC 125 MG IJ SOLR
INTRAMUSCULAR | Status: AC
  Filled 2019-11-04: qty 2

## 2019-11-04 MED ORDER — SODIUM CHLORIDE 0.9 % IV SOLN
375.0000 mg/m2 | Freq: Once | INTRAVENOUS | Status: AC
  Administered 2019-11-04: 800 mg via INTRAVENOUS
  Filled 2019-11-04: qty 30

## 2019-11-04 MED ORDER — SODIUM CHLORIDE 0.9 % IV SOLN
Freq: Once | INTRAVENOUS | Status: AC
  Administered 2019-11-04: 8 mg via INTRAVENOUS
  Filled 2019-11-04: qty 4

## 2019-11-04 MED ORDER — SODIUM CHLORIDE 0.9 % IV SOLN: Freq: Once | INTRAVENOUS | Status: AC

## 2019-11-04 MED ORDER — FAMOTIDINE IN NACL 20-0.9 MG/50ML-% IV SOLN
20.0000 mg | Freq: Once | INTRAVENOUS | Status: AC
  Administered 2019-11-04: 20 mg via INTRAVENOUS

## 2019-11-04 MED ORDER — DIPHENHYDRAMINE HCL 50 MG/ML IJ SOLN
50.0000 mg | Freq: Once | INTRAMUSCULAR | Status: AC
  Administered 2019-11-04: 50 mg via INTRAVENOUS

## 2019-11-04 MED ORDER — ACETAMINOPHEN 325 MG PO TABS
650.0000 mg | ORAL_TABLET | Freq: Once | ORAL | Status: AC
  Administered 2019-11-04: 650 mg via ORAL

## 2019-11-04 MED ORDER — ACETAMINOPHEN 325 MG PO TABS
ORAL_TABLET | ORAL | Status: AC
  Filled 2019-11-04: qty 2

## 2019-11-04 MED ORDER — FAMOTIDINE IN NACL 20-0.9 MG/50ML-% IV SOLN
INTRAVENOUS | Status: AC
  Filled 2019-11-04: qty 50

## 2019-11-04 MED ORDER — ONDANSETRON HCL 4 MG/2ML IJ SOLN: 8.0000 mg | Freq: Once | INTRAMUSCULAR | Status: DC

## 2019-11-04 NOTE — Progress Notes (Signed)
11/04/19  Spoke with Dr Delton Coombes to add additional pre-medications due to reaction with cycle #1.  Add meperidine 50 mg IV x 1 prn rigors Add Zofran 8 mg IVPB x 1 Add Solu-Medrol 80 mg IV push x 1  Orders updated to reflect changes.  V.O. Dr Rhys Martini, PharmD

## 2019-11-04 NOTE — Progress Notes (Signed)
Betty Hayes, [place] [address]   CLINIC:  Medical Oncology/Hematology  PCP:  Dettinger, Fransisca Kaufmann, MD [place] [address] [phone]   REASON FOR VISIT: Follow-up for follicular lymphoma   CURRENT THERAPY: Rituximab  BRIEF ONCOLOGIC HISTORY:  Oncology History  Follicular lymphoma ([place])  07/28/2019 Initial Diagnosis   Follicular lymphoma ([place])   09/29/2019 Cancer Staging   Staging form: Hodgkin and Non-Hodgkin Lymphoma, AJCC 8th Edition - Clinical stage from 0/21/1155: Stage IE (Follicular lymphoma) - Signed by Derek Jack, MD on 08/28/221   Grade I follicular lymphoma of extranodal site excluding spleen and other solid organs ([place])  09/29/2019 Initial Diagnosis   Grade I follicular lymphoma of extranodal site excluding spleen and other solid organs ([place])   10/27/2019 -  Chemotherapy   The patient had ondansetron (ZOFRAN) injection 8 mg, 8 mg (100 % of original dose 8 mg), Intravenous,  Once, 1 of 3 cycles Dose modification: 8 mg (original dose 8 mg, Cycle 2) ondansetron (ZOFRAN) 8 mg in sodium chloride 0.9 % 50 mL IVPB, , Intravenous,  Once, 1 of 3 cycles riTUXimab-pvvr (RUXIENCE) 800 mg in sodium chloride 0.9 % 250 mL (2.4242 mg/mL) infusion, 375 mg/m2 = 800 mg, Intravenous,  Once, 2 of 4 cycles Administration: 800 mg (10/27/2019)  for chemotherapy treatment.      CANCER STAGING: Cancer Staging Follicular lymphoma ([place]) Staging form: Hodgkin and Non-Hodgkin Lymphoma, AJCC 8th Edition - Clinical stage from 3/61/2244: Stage IE (Follicular lymphoma) - Signed by Derek Jack, MD on 09/29/2019    INTERVAL HISTORY:  Betty Hayes 34 y.o. female returns for routine follow-up for follicular lymphoma.  Patient reports she is doing well since her last treatment.  She reports some mild fatigue but no other symptoms. Denies any nausea, vomiting, or diarrhea. Denies any new pains. Had not noticed any recent bleeding such  as epistaxis, hematuria or hematochezia. Denies recent chest pain on exertion, shortness of breath on minimal exertion, pre-syncopal episodes, or palpitations. Denies any numbness or tingling in hands or feet. Denies any recent fevers, infections, or recent hospitalizations. Patient reports appetite at 50% and energy level at 100%.  She is eating well maintain her weight this time.     REVIEW OF SYSTEMS:  Review of Systems  Constitutional: Positive for fatigue.  Neurological: Positive for headaches.  Psychiatric/Behavioral: Positive for sleep disturbance. The patient is nervous/anxious.   All other systems reviewed and are negative.    PAST MEDICAL/SURGICAL HISTORY:  Past Medical History:  Diagnosis Date  . Allergy   . Cancer ([place]) 97/5300   follicular lymphoma  . Dysphagia   . GERD (gastroesophageal reflux disease)   . Thyroid disease    Hypothyroidism  . Thyroid nodule 2020   Past Surgical History:  Procedure Laterality Date  . ABDOMINAL HYSTERECTOMY    . ESOPHAGOGASTRODUODENOSCOPY N/A 07/13/2019   Procedure: ESOPHAGOGASTRODUODENOSCOPY (EGD);  Surgeon: Daneil Dolin, MD; normal esophagus s/p dilation and biopsied, normal stomach, patchy areas of whitish, nodular mucosa in the second portion of the duodenum s/p biopsy.  Duodenal biopsy with low-grade follicular lymphoma.  Esophageal biopsies benign without increased intraepithelial eosinophils.  Marland Kitchen KIDNEY STONE SURGERY    . MALONEY DILATION N/A 07/13/2019   Procedure: Venia Minks DILATION;  Surgeon: Daneil Dolin, MD;  Location: [facility];  Service: Endoscopy;  Laterality: N/A;  . TONSILLECTOMY     Around age 28  . WISDOM TOOTH EXTRACTION  SOCIAL HISTORY:  Social History   Socioeconomic History  . Marital status: Married    Spouse name: Not on file  . Number of children: 2  . Years of education: Not on file  . Highest education level: Not on file  Occupational History    Employer: [employer]  Tobacco  Use  . Smoking status: Never Smoker  . Smokeless tobacco: Never Used  Substance and Sexual Activity  . Alcohol use: Yes    Comment: occasional- maybe once a month  . Drug use: No  . Sexual activity: Yes    Birth control/protection: Surgical    Comment: married 14 years  Other Topics Concern  . Not on file  Social History Narrative  . Not on file   Social Determinants of Health   Financial Resource Strain: Low Risk   . Difficulty of Paying Living Expenses: Not hard at all  Food Insecurity: No Food Insecurity  . Worried About Charity fundraiser in the Last Year: Never true  . Ran Out of Food in the Last Year: Never true  Transportation Needs: No Transportation Needs  . Lack of Transportation (Medical): No  . Lack of Transportation (Non-Medical): No  Physical Activity: Insufficiently Active  . Days of Exercise per Week: 5 days  . Minutes of Exercise per Session: 20 min  Stress: Stress Concern Present  . Feeling of Stress : To some extent  Social Connections: Somewhat Isolated  . Frequency of Communication with Friends and Family: More than three times a week  . Frequency of Social Gatherings with Friends and Family: More than three times a week  . Attends Religious Services: Never  . Active Member of Clubs or Organizations: No  . Attends Archivist Meetings: Never  . Marital Status: Married  Human resources officer Violence: Not At Risk  . Fear of Current or Ex-Partner: No  . Emotionally Abused: No  . Physically Abused: No  . Sexually Abused: No    FAMILY HISTORY:  Family History  Problem Relation Age of Onset  . Thrombocytopenia Mother        itp  . Heart disease Mother   . Hypertension Mother   . Seizures Brother   . ADD / ADHD Brother   . Colon cancer Paternal Grandfather        in 42s  . Diabetes Maternal Grandmother   . Stroke Maternal Grandmother   . Heart disease Maternal Grandfather   . Stroke Maternal Grandfather   . Hypothyroidism Paternal  Grandmother   . Psoriasis Brother   . Asthma Daughter   . Kidney disease Maternal Uncle   . Non-Hodgkin's lymphoma Paternal Uncle   . Leukemia Paternal Great-grandmother   . Ovarian cancer Maternal Great-grandmother   . Breast cancer Other        maternal great aunt  . Colon polyps Neg Hx     CURRENT MEDICATIONS:  Outpatient Encounter Medications as of 11/04/2019  Medication Sig  . Multiple Vitamins-Minerals (MULTIVITAMIN ADULT PO) Take 1 tablet by mouth daily.   . pantoprazole (PROTONIX) 40 MG tablet Take 1 tablet (40 mg total) by mouth 2 (two) times daily before a meal.  . riTUXimab in sodium chloride 0.9 % 250 mL Inject into the vein once a week. Weekly x 4 cycles  . SYNTHROID 100 MCG tablet Take 100 mcg by mouth every morning.  . fexofenadine (ALLEGRA) 180 MG tablet Take 180 mg by mouth daily as needed for allergies.    No facility-administered encounter  medications on file as of 11/04/2019.    ALLERGIES:  Allergies  Allergen Reactions  . Latex Rash     PHYSICAL EXAM:  ECOG Performance status: 1  Vitals:   11/04/19 0832  BP: 136/83  Pulse: 69  Resp: 18  Temp: (!) 97.5 F (36.4 C)  SpO2: 98%   Filed Weights   11/04/19 0832  Weight: 202 lb 9.6 oz (91.9 kg)   Physical Exam Constitutional:      Appearance: Normal appearance. She is normal weight.  Cardiovascular:     Rate and Rhythm: Normal rate and regular rhythm.     Heart sounds: Normal heart sounds.  Pulmonary:     Effort: Pulmonary effort is normal.     Breath sounds: Normal breath sounds.  Abdominal:     General: Bowel sounds are normal.     Palpations: Abdomen is soft.  Musculoskeletal:        General: Normal range of motion.  Skin:    General: Skin is warm.  Neurological:     Mental Status: She is alert and oriented to person, place, and time. Mental status is at baseline.  Psychiatric:        Mood and Affect: Mood normal.        Behavior: Behavior normal.        Thought Content: Thought  content normal.        Judgment: Judgment normal.      LABORATORY DATA:  I have reviewed the labs as listed.  CBC    Component Value Date/Time   WBC 7.5 11/04/2019 0808   RBC 4.78 11/04/2019 0808   HGB 13.4 11/04/2019 0808   HGB 13.1 12/04/2017 1012   HCT 41.1 11/04/2019 0808   HCT 37.8 12/04/2017 1012   PLT 218 11/04/2019 0808   PLT 202 12/04/2017 1012   MCV 86.0 11/04/2019 0808   MCV 82 12/04/2017 1012   MCH 28.0 11/04/2019 0808   MCHC 32.6 11/04/2019 0808   RDW 12.4 11/04/2019 0808   RDW 13.3 12/04/2017 1012   LYMPHSABS 2.0 11/04/2019 0808   LYMPHSABS 2.3 12/04/2017 1012   MONOABS 0.7 11/04/2019 0808   EOSABS 0.2 11/04/2019 0808   EOSABS 0.2 12/04/2017 1012   BASOSABS 0.1 11/04/2019 0808   BASOSABS 0.0 12/04/2017 1012   CMP Latest Ref Rng & Units 11/04/2019 10/27/2019 04/05/2019  Glucose 70 - 99 mg/dL 83 98 81  BUN 6 - 20 mg/dL _0 Creatinine 0.44 - 1.00 mg/dL 0.68 0.60 0.81  Sodium 135 - 145 mmol/L 139 141 139  Potassium 3.5 - 5.1 mmol/L 4.2 3.9 4.7  Chloride 98 - 111 mmol/L 103 106 103  CO2 22 - 32 mmol/L _1 Calcium 8.9 - 10.3 mg/dL 9.7 9.4 9.9  Total Protein 6.5 - 8.1 g/dL 7.6 7.3 7.1  Total Bilirubin 0.3 - 1.2 mg/dL 0.8 1.0 0.8  Alkaline Phos 38 - 126 U/L 67 68 81  AST 15 - 41 U/L 32 20 22  ALT 0 - 44 U/L 48(H) 19 15   All questions were answered to patient's stated satisfaction. Encouraged patient to call with any new concerns or questions before his next visit to the cancer center and we can certain see him sooner, if needed.   I personally saw the patient face-to-face.  ASSESSMENT & PLAN:  Follicular lymphoma ([place]) 1.  Stage I AE duodenal follicular lymphoma: -EGD on 07/13/2019 shows patchy areas of whitish, nodular mucosa in the second part of duodenum,  biopsy consistent with grade 1-2 follicular lymphoma: -PET scan on 07/25/2019 showed very mild hypermetabolic along the second portion of the duodenum, maximum SUV 3.6. -No B symptoms.  She lost  some weight due to digestive problems. -Bone marrow biopsy on 08/02/2019 showed normocellular marrow with trilineage hematopoiesis. -She has acid reflux symptoms which improved mildly after starting gluten-free diet.  Continues to have nausea and diarrhea. -Needs repeat EGD and biopsy at least 3 to 6 months after completion of rituximab.  We will do scans in 6 months. -10/27/2019 she was started on rituximab her rate was increased to 100 and she developed rigors and chills. Rituximab was held Demerol 50 mg IV was given she later developed nausea.  She was given Zofran 8 mg IV.  Rituximab was started back at 200 mg/h rate and she developed increasing heart rate to the 90s.  And she was slightly anxious.  She had successful completed rituximab infusion that day. -Labs done on 11/04/2019 showed slightly increased LFTs all other labs WNL. -11/04/2019-second dose of rituximab -She will follow up with repeat labs.  2.  Hashimoto's thyroiditis: -She is on Synthroid 100 mcg daily.  3.  Family history: -Paternal uncle had DLBCL. -Paternal grandmother had leukemia. -Paternal grandfather had colon cancer. -Maternal great aunt had breast cancer. -Maternal great grandmother had ovarian cancer. -Mother had ITP.     Orders placed this encounter:  Orders Placed This Encounter  Procedures  . Lactate dehydrogenase  . Magnesium  . CBC with Differential/Platelet  . Comprehensive metabolic panel  . Uric acid      Francene Finders, FNP-C [facility] [phone]

## 2019-11-04 NOTE — Patient Instructions (Signed)
[facility] at [facility] Discharge Instructions     Thank you for choosing [facility] at [facility] to provide your oncology and hematology care.  To afford each patient quality time with our provider, please arrive at least 15 minutes before your scheduled appointment time.   If you have a lab appointment with the Cancer Center please come in thru the Main Entrance and check in at the main information desk.  You need to re-schedule your appointment should you arrive 10 or more minutes late.  We strive to give you quality time with our providers, and arriving late affects you and other patients whose appointments are after yours.  Also, if you no show three or more times for appointments you may be dismissed from the clinic at the providers discretion.     Again, thank you for choosing [facility].  Our hope is that these requests will decrease the amount of time that you wait before being seen by our physicians.       _____________________________________________________________  Should you have questions after your visit to [facility], please contact our office at [phone] between the hours of 8:00 a.m. and 4:30 p.m.  Voicemails left after 4:00 p.m. will not be returned until the following business day.  For prescription refill requests, have your pharmacy contact our office and allow 72 hours.    Due to Covid, you will need to wear a mask upon entering the hospital. If you do not have a mask, a mask will be given to you at the Main Entrance upon arrival. For doctor visits, patients may have 1 support person with them. For treatment visits, patients can not have anyone with them due to social distancing guidelines and our immunocompromised population.      

## 2019-11-04 NOTE — Assessment & Plan Note (Signed)
1.  Stage I AE duodenal follicular lymphoma: -EGD on 07/13/2019 shows patchy areas of whitish, nodular mucosa in the second part of duodenum, biopsy consistent with grade 1-2 follicular lymphoma: -PET scan on 07/25/2019 showed very mild hypermetabolic along the second portion of the duodenum, maximum SUV 3.6. -No B symptoms.  She lost some weight due to digestive problems. -Bone marrow biopsy on 08/02/2019 showed normocellular marrow with trilineage hematopoiesis. -She has acid reflux symptoms which improved mildly after starting gluten-free diet.  Continues to have nausea and diarrhea. -Needs repeat EGD and biopsy at least 3 to 6 months after completion of rituximab.  We will do scans in 6 months. -10/27/2019 she was started on rituximab her rate was increased to 100 and she developed rigors and chills. Rituximab was held Demerol 50 mg IV was given she later developed nausea.  She was given Zofran 8 mg IV.  Rituximab was started back at 200 mg/h rate and she developed increasing heart rate to the 90s.  And she was slightly anxious.  She had successful completed rituximab infusion that day. -Labs done on 11/04/2019 showed slightly increased LFTs all other labs WNL. -11/04/2019-second dose of rituximab -She will follow up with repeat labs.  2.  Hashimoto's thyroiditis: -She is on Synthroid 100 mcg daily.  3.  Family history: -Paternal uncle had DLBCL. -Paternal grandmother had leukemia. -Paternal grandfather had colon cancer. -Maternal great aunt had breast cancer. -Maternal great grandmother had ovarian cancer. -Mother had ITP.

## 2019-11-04 NOTE — Patient Instructions (Signed)
[facility] Discharge Instructions for Patients Receiving Chemotherapy  Today you received the following chemotherapy agents   To help prevent nausea and vomiting after your treatment, we encourage you to take your nausea medication   If you develop nausea and vomiting that is not controlled by your nausea medication, call the clinic.   BELOW ARE SYMPTOMS THAT SHOULD BE REPORTED IMMEDIATELY:  *FEVER GREATER THAN 100.5 F  *CHILLS WITH OR WITHOUT FEVER  NAUSEA AND VOMITING THAT IS NOT CONTROLLED WITH YOUR NAUSEA MEDICATION  *UNUSUAL SHORTNESS OF BREATH  *UNUSUAL BRUISING OR BLEEDING  TENDERNESS IN MOUTH AND THROAT WITH OR WITHOUT PRESENCE OF ULCERS  *URINARY PROBLEMS  *BOWEL PROBLEMS  UNUSUAL RASH Items with * indicate a potential emergency and should be followed up as soon as possible.  Feel free to call the clinic should you have any questions or concerns. The clinic phone number is [phone].  Please show the CHEMO ALERT CARD at check-in to the Emergency Department and triage nurse.   

## 2019-11-04 NOTE — Progress Notes (Signed)
Patient presents today for Rituxan infusion. Cycle 2. Vital signs are within parameters for treatment. Labs are within parameters for treatment.   Labs reviewed by [facility] NP. Proceed with treatment order received.   Verbal Order received from [facility] NP. May give Demerol 19m IV  prior to Rituxan.   Treatment given today per MD orders. Tolerated infusion without adverse affects. Vital signs stable. No complaints at this time. Discharged from clinic ambulatory. [facility]

## 2019-11-07 NOTE — Progress Notes (Signed)

## 2019-11-08 ENCOUNTER — Other Ambulatory Visit (HOSPITAL_COMMUNITY): Payer: Self-pay | Admitting: *Deleted

## 2019-11-08 MED ORDER — PROCHLORPERAZINE MALEATE 10 MG PO TABS: 10.0000 mg | ORAL_TABLET | Freq: Four times a day (QID) | ORAL | 2 refills | Status: DC | PRN

## 2019-11-10 ENCOUNTER — Ambulatory Visit (HOSPITAL_COMMUNITY): Payer: BC Managed Care – PPO | Admitting: Hematology

## 2019-11-10 ENCOUNTER — Other Ambulatory Visit (HOSPITAL_COMMUNITY): Payer: BC Managed Care – PPO

## 2019-11-10 ENCOUNTER — Ambulatory Visit (HOSPITAL_COMMUNITY): Payer: BC Managed Care – PPO

## 2019-11-11 ENCOUNTER — Inpatient Hospital Stay (HOSPITAL_COMMUNITY): Payer: BC Managed Care – PPO

## 2019-11-11 ENCOUNTER — Ambulatory Visit (HOSPITAL_COMMUNITY): Payer: BC Managed Care – PPO

## 2019-11-11 ENCOUNTER — Encounter (HOSPITAL_COMMUNITY): Payer: Self-pay | Admitting: Nurse Practitioner

## 2019-11-11 ENCOUNTER — Inpatient Hospital Stay (HOSPITAL_BASED_OUTPATIENT_CLINIC_OR_DEPARTMENT_OTHER): Payer: BC Managed Care – PPO | Admitting: Nurse Practitioner

## 2019-11-11 ENCOUNTER — Other Ambulatory Visit: Payer: Self-pay

## 2019-11-11 ENCOUNTER — Ambulatory Visit (HOSPITAL_COMMUNITY): Payer: BC Managed Care – PPO | Admitting: Nurse Practitioner

## 2019-11-11 VITALS — BP 110/64 | HR 73 | Temp 97.8°F | Resp 18 | Wt 200.5 lb

## 2019-11-11 DIAGNOSIS — R110 Nausea: Secondary | ICD-10-CM | POA: Diagnosis not present

## 2019-11-11 DIAGNOSIS — E063 Autoimmune thyroiditis: Secondary | ICD-10-CM | POA: Diagnosis not present

## 2019-11-11 DIAGNOSIS — C8209 Follicular lymphoma grade I, extranodal and solid organ sites: Secondary | ICD-10-CM | POA: Diagnosis not present

## 2019-11-11 DIAGNOSIS — R6889 Other general symptoms and signs: Secondary | ICD-10-CM | POA: Diagnosis not present

## 2019-11-11 DIAGNOSIS — Z79899 Other long term (current) drug therapy: Secondary | ICD-10-CM | POA: Diagnosis not present

## 2019-11-11 DIAGNOSIS — R6883 Chills (without fever): Secondary | ICD-10-CM | POA: Diagnosis not present

## 2019-11-11 DIAGNOSIS — Z5112 Encounter for antineoplastic immunotherapy: Secondary | ICD-10-CM | POA: Diagnosis not present

## 2019-11-11 DIAGNOSIS — C8299 Follicular lymphoma, unspecified, extranodal and solid organ sites: Secondary | ICD-10-CM | POA: Diagnosis not present

## 2019-11-11 LAB — CBC WITH DIFFERENTIAL/PLATELET
Abs Immature Granulocytes: 0.03 10*3/uL (ref 0.00–0.07)
Basophils Absolute: 0.1 10*3/uL (ref 0.0–0.1)
Basophils Relative: 1 %
Eosinophils Absolute: 0.2 10*3/uL (ref 0.0–0.5)
Eosinophils Relative: 2 %
HCT: 42.9 % (ref 36.0–46.0)
Hemoglobin: 14.2 g/dL (ref 12.0–15.0)
Immature Granulocytes: 0 %
Lymphocytes Relative: 25 %
Lymphs Abs: 1.9 10*3/uL (ref 0.7–4.0)
MCH: 27.8 pg (ref 26.0–34.0)
MCHC: 33.1 g/dL (ref 30.0–36.0)
MCV: 84.1 fL (ref 80.0–100.0)
Monocytes Absolute: 0.7 10*3/uL (ref 0.1–1.0)
Monocytes Relative: 9 %
Neutro Abs: 4.7 10*3/uL (ref 1.7–7.7)
Neutrophils Relative %: 63 %
Platelets: 233 10*3/uL (ref 150–400)
RBC: 5.1 MIL/uL (ref 3.87–5.11)
RDW: 12.5 % (ref 11.5–15.5)
WBC: 7.5 10*3/uL (ref 4.0–10.5)
nRBC: 0 % (ref 0.0–0.2)

## 2019-11-11 LAB — COMPREHENSIVE METABOLIC PANEL
ALT: 24 U/L (ref 0–44)
AST: 21 U/L (ref 15–41)
Albumin: 4.3 g/dL (ref 3.5–5.0)
Alkaline Phosphatase: 67 U/L (ref 38–126)
Anion gap: 8 (ref 5–15)
BUN: 15 mg/dL (ref 6–20)
CO2: 25 mmol/L (ref 22–32)
Calcium: 9.7 mg/dL (ref 8.9–10.3)
Chloride: 105 mmol/L (ref 98–111)
Creatinine, Ser: 0.68 mg/dL (ref 0.44–1.00)
GFR calc Af Amer: >60 mL/min (ref >60)
GFR calc non Af Amer: >60 mL/min (ref >60)
Glucose, Bld: 99 mg/dL (ref 70–99)
Potassium: 3.9 mmol/L (ref 3.5–5.1)
Sodium: 138 mmol/L (ref 135–145)
Total Bilirubin: 1.4 mg/dL — ABNORMAL HIGH (ref 0.3–1.2)
Total Protein: 7.6 g/dL (ref 6.5–8.1)

## 2019-11-11 LAB — URIC ACID: Uric Acid, Serum: 5.7 mg/dL (ref 2.5–7.1)

## 2019-11-11 LAB — MAGNESIUM: Magnesium: 2 mg/dL (ref 1.7–2.4)

## 2019-11-11 LAB — LACTATE DEHYDROGENASE: LDH: 115 U/L (ref 98–192)

## 2019-11-11 MED ORDER — SODIUM CHLORIDE 0.9 % IV SOLN
375.0000 mg/m2 | Freq: Once | INTRAVENOUS | Status: AC
  Administered 2019-11-11: 12:00:00 800 mg via INTRAVENOUS
  Filled 2019-11-11: qty 50

## 2019-11-11 MED ORDER — DIPHENHYDRAMINE HCL 50 MG/ML IJ SOLN
50.0000 mg | Freq: Once | INTRAMUSCULAR | Status: AC
  Administered 2019-11-11: 50 mg via INTRAVENOUS
  Filled 2019-11-11: qty 1

## 2019-11-11 MED ORDER — FAMOTIDINE IN NACL 20-0.9 MG/50ML-% IV SOLN
20.0000 mg | Freq: Once | INTRAVENOUS | Status: AC
  Administered 2019-11-11: 20 mg via INTRAVENOUS
  Filled 2019-11-11: qty 50

## 2019-11-11 MED ORDER — MEPERIDINE HCL 50 MG/ML IJ SOLN
50.0000 mg | Freq: Once | INTRAMUSCULAR | Status: AC | PRN
  Administered 2019-11-11: 50 mg via INTRAVENOUS
  Filled 2019-11-11: qty 1

## 2019-11-11 MED ORDER — ACETAMINOPHEN 325 MG PO TABS
650.0000 mg | ORAL_TABLET | Freq: Once | ORAL | Status: AC
  Administered 2019-11-11: 650 mg via ORAL
  Filled 2019-11-11: qty 2

## 2019-11-11 MED ORDER — SODIUM CHLORIDE 0.9% FLUSH
10.0000 mL | INTRAVENOUS | Status: DC | PRN
  Administered 2019-11-11: 10 mL

## 2019-11-11 MED ORDER — ONDANSETRON HCL 4 MG/2ML IJ SOLN: 8.0000 mg | Freq: Once | INTRAMUSCULAR | Status: DC

## 2019-11-11 MED ORDER — SODIUM CHLORIDE 0.9 % IV SOLN
Freq: Once | INTRAVENOUS | Status: AC
  Administered 2019-11-11: 8 mg via INTRAVENOUS
  Filled 2019-11-11: qty 4

## 2019-11-11 MED ORDER — SODIUM CHLORIDE 0.9 % IV SOLN: Freq: Once | INTRAVENOUS | Status: AC

## 2019-11-11 NOTE — Progress Notes (Signed)
Betty Hayes, [place] [address]   CLINIC:  Medical Oncology/Hematology  PCP:  Dettinger, Fransisca Kaufmann, MD [place] [id number] [phone]   REASON FOR VISIT: Follow-up for follicular lymphoma   CURRENT THERAPY: Rituximab  BRIEF ONCOLOGIC HISTORY:  Oncology History  Follicular lymphoma ([place])  07/28/2019 Initial Diagnosis   Follicular lymphoma ([place])   09/29/2019 Cancer Staging   Staging form: Hodgkin and Non-Hodgkin Lymphoma, AJCC 8th Edition - Clinical stage from 10/09/252: Stage IE (Follicular lymphoma) - Signed by Derek Jack, MD on 2/70/6237   Grade I follicular lymphoma of extranodal site excluding spleen and other solid organs ([place])  09/29/2019 Initial Diagnosis   Grade I follicular lymphoma of extranodal site excluding spleen and other solid organs ([place])   10/27/2019 -  Chemotherapy   The patient had ondansetron (ZOFRAN) injection 8 mg, 8 mg (100 % of original dose 8 mg), Intravenous,  Once, 2 of 3 cycles Dose modification: 8 mg (original dose 8 mg, Cycle 2) ondansetron (ZOFRAN) 8 mg in sodium chloride 0.9 % 50 mL IVPB, , Intravenous,  Once, 2 of 3 cycles Administration: 8 mg (11/04/2019) riTUXimab-pvvr (RUXIENCE) 800 mg in sodium chloride 0.9 % 250 mL (2.4242 mg/mL) infusion, 375 mg/m2 = 800 mg, Intravenous,  Once, 3 of 4 cycles Administration: 800 mg (10/27/2019), 800 mg (11/04/2019)  for chemotherapy treatment.      CANCER STAGING: Cancer Staging Follicular lymphoma ([place]) Staging form: Hodgkin and Non-Hodgkin Lymphoma, AJCC 8th Edition - Clinical stage from 01/15/3150: Stage IE (Follicular lymphoma) - Signed by Derek Jack, MD on 09/29/2019    INTERVAL HISTORY:  Betty Hayes 34 y.o. female returns for routine follow-up for follicular lymphoma.  Patient reports she is doing well since her last treatment.  She reports she only had 2 bad days after treatment.  She had some nausea and vomiting 1 evening.   Otherwise she has done very well. Denies any nausea, vomiting, or diarrhea. Denies any new pains. Had not noticed any recent bleeding such as epistaxis, hematuria or hematochezia. Denies recent chest pain on exertion, shortness of breath on minimal exertion, pre-syncopal episodes, or palpitations. Denies any numbness or tingling in hands or feet. Denies any recent fevers, infections, or recent hospitalizations. Patient reports appetite at 25% and energy level at 50%.  She has maintain her weight at this time.     REVIEW OF SYSTEMS:  Review of Systems  Gastrointestinal: Positive for nausea (For 1 day after treatment) and vomiting (For 1 day after treatment).  Psychiatric/Behavioral: Positive for sleep disturbance. The patient is nervous/anxious.   All other systems reviewed and are negative.    PAST MEDICAL/SURGICAL HISTORY:  Past Medical History:  Diagnosis Date  . Allergy   . Cancer ([place]) 76/1607   follicular lymphoma  . Dysphagia   . GERD (gastroesophageal reflux disease)   . Thyroid disease    Hypothyroidism  . Thyroid nodule 2020   Past Surgical History:  Procedure Laterality Date  . ABDOMINAL HYSTERECTOMY    . ESOPHAGOGASTRODUODENOSCOPY N/A 07/13/2019   Procedure: ESOPHAGOGASTRODUODENOSCOPY (EGD);  Surgeon: Daneil Dolin, MD; normal esophagus s/p dilation and biopsied, normal stomach, patchy areas of whitish, nodular mucosa in the second portion of the duodenum s/p biopsy.  Duodenal biopsy with low-grade follicular lymphoma.  Esophageal biopsies benign without increased intraepithelial eosinophils.  Marland Kitchen KIDNEY STONE SURGERY    . MALONEY DILATION N/A 07/13/2019   Procedure: Venia Minks DILATION;  Surgeon: Daneil Dolin, MD;  [facility]  [facility];  Service: Endoscopy;  Laterality: N/A;  . TONSILLECTOMY     Around age 58  . WISDOM TOOTH EXTRACTION       SOCIAL HISTORY:  Social History   Socioeconomic History  . Marital status: Married    Spouse name: Not on file  .  Number of children: 2  . Years of education: Not on file  . Highest education level: Not on file  Occupational History    Employer: [employer]  Tobacco Use  . Smoking status: Never Smoker  . Smokeless tobacco: Never Used  Substance and Sexual Activity  . Alcohol use: Yes    Comment: occasional- maybe once a month  . Drug use: No  . Sexual activity: Yes    Birth control/protection: Surgical    Comment: married 14 years  Other Topics Concern  . Not on file  Social History Narrative  . Not on file   Social Determinants of Health   Financial Resource Strain: Low Risk   . Difficulty of Paying Living Expenses: Not hard at all  Food Insecurity: No Food Insecurity  . Worried About Charity fundraiser in the Last Year: Never true  . Ran Out of Food in the Last Year: Never true  Transportation Needs: No Transportation Needs  . Lack of Transportation (Medical): No  . Lack of Transportation (Non-Medical): No  Physical Activity: Insufficiently Active  . Days of Exercise per Week: 5 days  . Minutes of Exercise per Session: 20 min  Stress: Stress Concern Present  . Feeling of Stress : To some extent  Social Connections: Somewhat Isolated  . Frequency of Communication with Friends and Family: More than three times a week  . Frequency of Social Gatherings with Friends and Family: More than three times a week  . Attends Religious Services: Never  . Active Member of Clubs or Organizations: No  . Attends Archivist Meetings: Never  . Marital Status: Married  Human resources officer Violence: Not At Risk  . Fear of Current or Ex-Partner: No  . Emotionally Abused: No  . Physically Abused: No  . Sexually Abused: No    FAMILY HISTORY:  Family History  Problem Relation Age of Onset  . Thrombocytopenia Mother        itp  . Heart disease Mother   . Hypertension Mother   . Seizures Brother   . ADD / ADHD Brother   . Colon cancer Paternal Grandfather        in 19s  . Diabetes  Maternal Grandmother   . Stroke Maternal Grandmother   . Heart disease Maternal Grandfather   . Stroke Maternal Grandfather   . Hypothyroidism Paternal Grandmother   . Psoriasis Brother   . Asthma Daughter   . Kidney disease Maternal Uncle   . Non-Hodgkin's lymphoma Paternal Uncle   . Leukemia Paternal Great-grandmother   . Ovarian cancer Maternal Great-grandmother   . Breast cancer Other        maternal great aunt  . Colon polyps Neg Hx     CURRENT MEDICATIONS:  Outpatient Encounter Medications as of 11/11/2019  Medication Sig  . fexofenadine (ALLEGRA) 180 MG tablet Take 180 mg by mouth daily as needed for allergies.   . Multiple Vitamins-Minerals (MULTIVITAMIN ADULT PO) Take 1 tablet by mouth daily.   . pantoprazole (PROTONIX) 40 MG tablet Take 1 tablet (40 mg total) by mouth 2 (two) times daily before a meal.  . riTUXimab in sodium chloride 0.9 % 250  mL Inject into the vein once a week. Weekly x 4 cycles  . SYNTHROID 100 MCG tablet Take 100 mcg by mouth every morning.  . prochlorperazine (COMPAZINE) 10 MG tablet Take 1 tablet (10 mg total) by mouth every 6 (six) hours as needed for nausea or vomiting. (Patient not taking: Reported on 11/11/2019)   No facility-administered encounter medications on file as of 11/11/2019.    ALLERGIES:  Allergies  Allergen Reactions  . Latex Rash     PHYSICAL EXAM:  ECOG Performance status: 1  Vitals:   11/11/19 0936  BP: (!) 142/77  Pulse: 82  Resp: 18  Temp: (!) 97.5 F (36.4 C)  SpO2: 99%   Filed Weights   11/11/19 0936  Weight: 200 lb (90.7 kg)   Physical Exam Constitutional:      Appearance: Normal appearance. She is normal weight.  Cardiovascular:     Rate and Rhythm: Normal rate and regular rhythm.     Heart sounds: Normal heart sounds.  Pulmonary:     Effort: Pulmonary effort is normal.     Breath sounds: Normal breath sounds.  Abdominal:     General: Bowel sounds are normal.     Palpations: Abdomen is soft.    Musculoskeletal:        General: Normal range of motion.  Skin:    General: Skin is warm.  Neurological:     Mental Status: She is alert and oriented to person, place, and time. Mental status is at baseline.  Psychiatric:        Mood and Affect: Mood normal.        Behavior: Behavior normal.        Thought Content: Thought content normal.        Judgment: Judgment normal.      LABORATORY DATA:  I have reviewed the labs as listed.  CBC    Component Value Date/Time   WBC 7.5 11/11/2019 0830   RBC 5.10 11/11/2019 0830   HGB 14.2 11/11/2019 0830   HGB 13.1 12/04/2017 1012   HCT 42.9 11/11/2019 0830   HCT 37.8 12/04/2017 1012   PLT 233 11/11/2019 0830   PLT 202 12/04/2017 1012   MCV 84.1 11/11/2019 0830   MCV 82 12/04/2017 1012   MCH 27.8 11/11/2019 0830   MCHC 33.1 11/11/2019 0830   RDW 12.5 11/11/2019 0830   RDW 13.3 12/04/2017 1012   LYMPHSABS 1.9 11/11/2019 0830   LYMPHSABS 2.3 12/04/2017 1012   MONOABS 0.7 11/11/2019 0830   EOSABS 0.2 11/11/2019 0830   EOSABS 0.2 12/04/2017 1012   BASOSABS 0.1 11/11/2019 0830   BASOSABS 0.0 12/04/2017 1012   CMP Latest Ref Rng & Units 11/11/2019 11/04/2019 10/27/2019  Glucose 70 - 99 mg/dL 99 83 98  BUN 6 - 20 mg/dL 15 12 11   Creatinine 0.44 - 1.00 mg/dL 0.68 0.68 0.60  Sodium 135 - 145 mmol/L 138 139 141  Potassium 3.5 - 5.1 mmol/L 3.9 4.2 3.9  Chloride 98 - 111 mmol/L 105 103 106  CO2 22 - 32 mmol/L 25 26 26   Calcium 8.9 - 10.3 mg/dL 9.7 9.7 9.4  Total Protein 6.5 - 8.1 g/dL 7.6 7.6 7.3  Total Bilirubin 0.3 - 1.2 mg/dL 1.4(H) 0.8 1.0  Alkaline Phos 38 - 126 U/L 67 67 68  AST 15 - 41 U/L 21 32 20  ALT 0 - 44 U/L 24 48(H) 19    All questions were answered to patient's stated satisfaction. Encouraged patient to call with  any new concerns or questions before his next visit to the cancer center and we can certain see him sooner, if needed.     ASSESSMENT & PLAN:  Follicular lymphoma ([place]) 1.  Stage I AE duodenal follicular  lymphoma: -EGD on 07/13/2019 shows patchy areas of whitish, nodular mucosa in the second part of duodenum, biopsy consistent with grade 1-2 follicular lymphoma: -PET scan on 07/25/2019 showed very mild hypermetabolic along the second portion of the duodenum, maximum SUV 3.6. -No B symptoms.  She lost some weight due to digestive problems. -Bone marrow biopsy on 08/02/2019 showed normocellular marrow with trilineage hematopoiesis. -She has acid reflux symptoms which improved mildly after starting gluten-free diet.  Continues to have nausea and diarrhea. -Needs repeat EGD and biopsy at least 3 to 6 months after completion of rituximab.  We will do scans in 6 months. -10/27/2019 she was started on rituximab her rate was increased to 100 and she developed rigors and chills. Rituximab was held Demerol 50 mg IV was given she later developed nausea.  She was given Zofran 8 mg IV.  Rituximab was started back at 200 mg/h rate and she developed increasing heart rate to the 90s.  And she was slightly anxious.  She had successful completed rituximab infusion that day. -Labs done on 11/11/2019 showed LFTs have returned to normal all other labs are WNL. -11/11/2019-third dose of rituximab -She will follow up with repeat labs.  2.  Hashimoto's thyroiditis: -She is on Synthroid 100 mcg daily.  3.  Family history: -Paternal uncle had DLBCL. -Paternal grandmother had leukemia. -Paternal grandfather had colon cancer. -Maternal great aunt had breast cancer. -Maternal great grandmother had ovarian cancer. -Mother had ITP.     Orders placed this encounter:  Orders Placed This Encounter  Procedures  . Lactate dehydrogenase  . Magnesium  . CBC with Differential/Platelet  . Comprehensive metabolic panel  . Phosphorus  . Uric acid      Francene Finders, FNP-C [facility] [phone]

## 2019-11-11 NOTE — Patient Instructions (Signed)
[place] at [facility] Discharge Instructions  Follow up in 1 week for labs and treatment   Thank you for choosing [place] at [facility] to provide your oncology and hematology care.  To afford each patient quality time with our provider, please arrive at least 15 minutes before your scheduled appointment time.   If you have a lab appointment with the Section please come in thru the Main Entrance and check in at the main information desk.  You need to re-schedule your appointment should you arrive 10 or more minutes late.  We strive to give you quality time with our providers, and arriving late affects you and other patients whose appointments are after yours.  Also, if you no show three or more times for appointments you may be dismissed from the clinic at the providers discretion.     Again, thank you for choosing [facility].  Our hope is that these requests will decrease the amount of time that you wait before being seen by our physicians.       _____________________________________________________________  Should you have questions after your visit to [facility], please contact our office at [phone] between the hours of 8:00 a.m. and 4:30 p.m.  Voicemails left after 4:00 p.m. will not be returned until the following business day.  For prescription refill requests, have your pharmacy contact our office and allow 72 hours.    Due to Covid, you will need to wear a mask upon entering the hospital. If you do not have a mask, a mask will be given to you at the Main Entrance upon arrival. For doctor visits, patients may have 1 support person with them. For treatment visits, patients can not have anyone with them due to social distancing guidelines and our immunocompromised population.

## 2019-11-11 NOTE — Assessment & Plan Note (Signed)
1.  Stage I AE duodenal follicular lymphoma: -EGD on 07/13/2019 shows patchy areas of whitish, nodular mucosa in the second part of duodenum, biopsy consistent with grade 1-2 follicular lymphoma: -PET scan on 07/25/2019 showed very mild hypermetabolic along the second portion of the duodenum, maximum SUV 3.6. -No B symptoms.  She lost some weight due to digestive problems. -Bone marrow biopsy on 08/02/2019 showed normocellular marrow with trilineage hematopoiesis. -She has acid reflux symptoms which improved mildly after starting gluten-free diet.  Continues to have nausea and diarrhea. -Needs repeat EGD and biopsy at least 3 to 6 months after completion of rituximab.  We will do scans in 6 months. -10/27/2019 she was started on rituximab her rate was increased to 100 and she developed rigors and chills. Rituximab was held Demerol 50 mg IV was given she later developed nausea.  She was given Zofran 8 mg IV.  Rituximab was started back at 200 mg/h rate and she developed increasing heart rate to the 90s.  And she was slightly anxious.  She had successful completed rituximab infusion that day. -Labs done on 11/11/2019 showed LFTs have returned to normal all other labs are WNL. -11/11/2019-third dose of rituximab -She will follow up with repeat labs.  2.  Hashimoto's thyroiditis: -She is on Synthroid 100 mcg daily.  3.  Family history: -Paternal uncle had DLBCL. -Paternal grandmother had leukemia. -Paternal grandfather had colon cancer. -Maternal great aunt had breast cancer. -Maternal great grandmother had ovarian cancer. -Mother had ITP.

## 2019-11-11 NOTE — Progress Notes (Signed)
Ok to give Demerol 50 mg IV as part of premeds today and ok to treat today verbal order Francene Finders, NP.    Patient tolerated Rituxan with no complaints voiced.  See MAR for details.  Peripheral IV site clean and dry with good blood return noted before and after administration.  No bruising or swelling noted at site.  No complaints of pain.  Band aid applied.  VSS with discharge and left in satisfactory condition with no s/s of distress noted.

## 2019-11-18 ENCOUNTER — Inpatient Hospital Stay (HOSPITAL_BASED_OUTPATIENT_CLINIC_OR_DEPARTMENT_OTHER): Payer: BC Managed Care – PPO | Admitting: Nurse Practitioner

## 2019-11-18 ENCOUNTER — Other Ambulatory Visit: Payer: Self-pay

## 2019-11-18 ENCOUNTER — Encounter (HOSPITAL_COMMUNITY): Payer: Self-pay

## 2019-11-18 ENCOUNTER — Inpatient Hospital Stay (HOSPITAL_COMMUNITY): Payer: BC Managed Care – PPO

## 2019-11-18 ENCOUNTER — Encounter (HOSPITAL_COMMUNITY): Payer: Self-pay | Admitting: Nurse Practitioner

## 2019-11-18 VITALS — BP 126/70 | HR 77 | Temp 97.6°F | Resp 18

## 2019-11-18 DIAGNOSIS — R6883 Chills (without fever): Secondary | ICD-10-CM | POA: Diagnosis not present

## 2019-11-18 DIAGNOSIS — R110 Nausea: Secondary | ICD-10-CM | POA: Diagnosis not present

## 2019-11-18 DIAGNOSIS — C8209 Follicular lymphoma grade I, extranodal and solid organ sites: Secondary | ICD-10-CM

## 2019-11-18 DIAGNOSIS — Z5112 Encounter for antineoplastic immunotherapy: Secondary | ICD-10-CM | POA: Diagnosis not present

## 2019-11-18 DIAGNOSIS — E063 Autoimmune thyroiditis: Secondary | ICD-10-CM | POA: Diagnosis not present

## 2019-11-18 DIAGNOSIS — R6889 Other general symptoms and signs: Secondary | ICD-10-CM | POA: Diagnosis not present

## 2019-11-18 DIAGNOSIS — C8299 Follicular lymphoma, unspecified, extranodal and solid organ sites: Secondary | ICD-10-CM | POA: Diagnosis not present

## 2019-11-18 DIAGNOSIS — Z79899 Other long term (current) drug therapy: Secondary | ICD-10-CM | POA: Diagnosis not present

## 2019-11-18 LAB — CBC WITH DIFFERENTIAL/PLATELET
Abs Immature Granulocytes: 0.01 10*3/uL (ref 0.00–0.07)
Basophils Absolute: 0.1 10*3/uL (ref 0.0–0.1)
Basophils Relative: 1 %
Eosinophils Absolute: 0.2 10*3/uL (ref 0.0–0.5)
Eosinophils Relative: 3 %
HCT: 38.9 % (ref 36.0–46.0)
Hemoglobin: 13 g/dL (ref 12.0–15.0)
Immature Granulocytes: 0 %
Lymphocytes Relative: 27 %
Lymphs Abs: 1.6 10*3/uL (ref 0.7–4.0)
MCH: 28.1 pg (ref 26.0–34.0)
MCHC: 33.4 g/dL (ref 30.0–36.0)
MCV: 84.2 fL (ref 80.0–100.0)
Monocytes Absolute: 0.5 10*3/uL (ref 0.1–1.0)
Monocytes Relative: 8 %
Neutro Abs: 3.6 10*3/uL (ref 1.7–7.7)
Neutrophils Relative %: 61 %
Platelets: 200 10*3/uL (ref 150–400)
RBC: 4.62 MIL/uL (ref 3.87–5.11)
RDW: 12.5 % (ref 11.5–15.5)
WBC: 5.9 10*3/uL (ref 4.0–10.5)
nRBC: 0 % (ref 0.0–0.2)

## 2019-11-18 LAB — COMPREHENSIVE METABOLIC PANEL
ALT: 18 U/L (ref 0–44)
AST: 21 U/L (ref 15–41)
Albumin: 4.1 g/dL (ref 3.5–5.0)
Alkaline Phosphatase: 64 U/L (ref 38–126)
Anion gap: 8 (ref 5–15)
BUN: 12 mg/dL (ref 6–20)
CO2: 24 mmol/L (ref 22–32)
Calcium: 8.9 mg/dL (ref 8.9–10.3)
Chloride: 105 mmol/L (ref 98–111)
Creatinine, Ser: 0.67 mg/dL (ref 0.44–1.00)
GFR calc Af Amer: >60 mL/min (ref >60)
GFR calc non Af Amer: >60 mL/min (ref >60)
Glucose, Bld: 131 mg/dL — ABNORMAL HIGH (ref 70–99)
Potassium: 4 mmol/L (ref 3.5–5.1)
Sodium: 137 mmol/L (ref 135–145)
Total Bilirubin: 1 mg/dL (ref 0.3–1.2)
Total Protein: 7 g/dL (ref 6.5–8.1)

## 2019-11-18 LAB — URIC ACID: Uric Acid, Serum: 5.3 mg/dL (ref 2.5–7.1)

## 2019-11-18 LAB — LACTATE DEHYDROGENASE: LDH: 117 U/L (ref 98–192)

## 2019-11-18 LAB — PHOSPHORUS: Phosphorus: 2.7 mg/dL (ref 2.5–4.6)

## 2019-11-18 LAB — MAGNESIUM: Magnesium: 2.1 mg/dL (ref 1.7–2.4)

## 2019-11-18 MED ORDER — MEPERIDINE HCL 50 MG/ML IJ SOLN: 50.0000 mg | Freq: Once | INTRAMUSCULAR | Status: DC | PRN

## 2019-11-18 MED ORDER — FAMOTIDINE IN NACL 20-0.9 MG/50ML-% IV SOLN
20.0000 mg | Freq: Once | INTRAVENOUS | Status: AC
  Administered 2019-11-18: 20 mg via INTRAVENOUS
  Filled 2019-11-18: qty 50

## 2019-11-18 MED ORDER — ONDANSETRON HCL 4 MG/2ML IJ SOLN: 8.0000 mg | Freq: Once | INTRAMUSCULAR | Status: DC

## 2019-11-18 MED ORDER — ACETAMINOPHEN 325 MG PO TABS
650.0000 mg | ORAL_TABLET | Freq: Once | ORAL | Status: AC
  Administered 2019-11-18: 650 mg via ORAL
  Filled 2019-11-18: qty 2

## 2019-11-18 MED ORDER — SODIUM CHLORIDE 0.9 % IV SOLN
375.0000 mg/m2 | Freq: Once | INTRAVENOUS | Status: AC
  Administered 2019-11-18: 800 mg via INTRAVENOUS
  Filled 2019-11-18: qty 50

## 2019-11-18 MED ORDER — SODIUM CHLORIDE 0.9 % IV SOLN
Freq: Once | INTRAVENOUS | Status: AC
  Administered 2019-11-18: 8 mg via INTRAVENOUS
  Filled 2019-11-18: qty 4

## 2019-11-18 MED ORDER — DIPHENHYDRAMINE HCL 50 MG/ML IJ SOLN
50.0000 mg | Freq: Once | INTRAMUSCULAR | Status: AC
  Administered 2019-11-18: 50 mg via INTRAVENOUS
  Filled 2019-11-18: qty 1

## 2019-11-18 MED ORDER — SODIUM CHLORIDE 0.9 % IV SOLN: Freq: Once | INTRAVENOUS | Status: AC

## 2019-11-18 MED ORDER — MEPERIDINE HCL 50 MG/ML IJ SOLN
50.0000 mg | Freq: Once | INTRAMUSCULAR | Status: AC
  Administered 2019-11-18: 50 mg via INTRAVENOUS
  Filled 2019-11-18: qty 1

## 2019-11-18 NOTE — Patient Instructions (Signed)
[facility] Discharge Instructions for Patients Receiving Chemotherapy  Today you received the following chemotherapy agents   To help prevent nausea and vomiting after your treatment, we encourage you to take your nausea medication   If you develop nausea and vomiting that is not controlled by your nausea medication, call the clinic.   BELOW ARE SYMPTOMS THAT SHOULD BE REPORTED IMMEDIATELY:  *FEVER GREATER THAN 100.5 F  *CHILLS WITH OR WITHOUT FEVER  NAUSEA AND VOMITING THAT IS NOT CONTROLLED WITH YOUR NAUSEA MEDICATION  *UNUSUAL SHORTNESS OF BREATH  *UNUSUAL BRUISING OR BLEEDING  TENDERNESS IN MOUTH AND THROAT WITH OR WITHOUT PRESENCE OF ULCERS  *URINARY PROBLEMS  *BOWEL PROBLEMS  UNUSUAL RASH Items with * indicate a potential emergency and should be followed up as soon as possible.  Feel free to call the clinic should you have any questions or concerns. The clinic phone number is [phone].  Please show the CHEMO ALERT CARD at check-in to the Emergency Department and triage nurse.   

## 2019-11-18 NOTE — Assessment & Plan Note (Signed)
1.  Stage I AE duodenal follicular lymphoma: -EGD on 07/13/2019 shows patchy areas of whitish, nodular mucosa in the second part of duodenum, biopsy consistent with grade 1-2 follicular lymphoma: -PET scan on 07/25/2019 showed very mild hypermetabolic along the second portion of the duodenum, maximum SUV 3.6. -No B symptoms.  She lost some weight due to digestive problems. -Bone marrow biopsy on 08/02/2019 showed normocellular marrow with trilineage hematopoiesis. -She has acid reflux symptoms which improved mildly after starting gluten-free diet.  Continues to have nausea and diarrhea. -Needs repeat EGD and biopsy at least 3 to 6 months after completion of rituximab.  We will do scans in 6 months. -10/27/2019 she was started on rituximab her rate was increased to 100 and she developed rigors and chills. Rituximab was held Demerol 50 mg IV was given she later developed nausea.  She was given Zofran 8 mg IV.  Rituximab was started back at 200 mg/h rate and she developed increasing heart rate to the 90s.  And she was slightly anxious.  She had successful completed rituximab infusion that day. -Labs done on 11/18/2019 showed all labs are WNL. -11/18/2019-fourth dose of rituximab -She will follow up with repeat labs.  2.  Hashimoto's thyroiditis: -She is on Synthroid 100 mcg daily.  3.  Family history: -Paternal uncle had DLBCL. -Paternal grandmother had leukemia. -Paternal grandfather had colon cancer. -Maternal great aunt had breast cancer. -Maternal great grandmother had ovarian cancer. -Mother had ITP.

## 2019-11-18 NOTE — Progress Notes (Signed)
Patient presents today for treatment and follow up visit with RLockamy NP. Vital signs within parameters for treatment. Labs reviewed by [facility] NP. Proceed with treatment order received.   Verbal order received RLockamy NP. Give 50mg  Meperidine ( Demerol ) IV today x 1 dose.   Treatment given today per MD orders. Tolerated infusion without adverse affects. Vital signs stable. No complaints at this time. Discharged from clinic ambulatory. F/U with [facility] as scheduled.

## 2019-11-18 NOTE — Progress Notes (Signed)
[place], [place] [address]   CLINIC:  Medical Oncology/Hematology  PCP:  Dettinger, Fransisca Kaufmann, MD [place] [address] [phone]   REASON FOR VISIT: Follow-up for follicular lymphoma   CURRENT THERAPY: Rituximab  BRIEF ONCOLOGIC HISTORY:  Oncology History  Follicular lymphoma ([place])  07/28/2019 Initial Diagnosis   Follicular lymphoma ([place])   09/29/2019 Cancer Staging   Staging form: Hodgkin and Non-Hodgkin Lymphoma, AJCC 8th Edition - Clinical stage from 9/92/4268: Stage IE (Follicular lymphoma) - Signed by Derek Jack, MD on 3/41/9622   Grade I follicular lymphoma of extranodal site excluding spleen and other solid organs ([place])  09/29/2019 Initial Diagnosis   Grade I follicular lymphoma of extranodal site excluding spleen and other solid organs ([place])   10/27/2019 -  Chemotherapy   The patient had ondansetron (ZOFRAN) injection 8 mg, 8 mg (100 % of original dose 8 mg), Intravenous,  Once, 3 of 3 cycles Dose modification: 8 mg (original dose 8 mg, Cycle 2) ondansetron (ZOFRAN) 8 mg in sodium chloride 0.9 % 50 mL IVPB, , Intravenous,  Once, 3 of 3 cycles Administration: 8 mg (11/04/2019), 8 mg (11/11/2019) riTUXimab-pvvr (RUXIENCE) 800 mg in sodium chloride 0.9 % 250 mL (2.4242 mg/mL) infusion, 375 mg/m2 = 800 mg, Intravenous,  Once, 4 of 4 cycles Administration: 800 mg (10/27/2019), 800 mg (11/04/2019), 800 mg (11/11/2019)  for chemotherapy treatment.      CANCER STAGING: Cancer Staging Follicular lymphoma ([place]) Staging form: Hodgkin and Non-Hodgkin Lymphoma, AJCC 8th Edition - Clinical stage from 2/97/9892: Stage IE (Follicular lymphoma) - Signed by Derek Jack, MD on 09/29/2019    INTERVAL HISTORY:  [name] 33 y.o. female returns for routine follow-up for follicular lymphoma.  Patient reports she has been doing well since her last visit.  She denies any extreme fatigue.  She denies any easy bruising or  bleeding.  She does report occasional headaches.  She does have some numbness in her fingertips and arms however it only last a short time and goes away.  She does have occasional shortness of breath with exertion.  And she is also having trouble sleeping at night.  She was taking Benadryl for this it is no longer working. Denies any nausea, vomiting, or diarrhea. Denies any new pains. Had not noticed any recent bleeding such as epistaxis, hematuria or hematochezia. Denies recent chest pain on exertion, shortness of breath on minimal exertion, pre-syncopal episodes, or palpitations. Denies any recent fevers, infections, or recent hospitalizations. Patient reports appetite at 75% and energy level at 50%.  She is eating well maintain her weight at this time.    REVIEW OF SYSTEMS:  Review of Systems  Respiratory: Positive for shortness of breath (With exertion).   Neurological: Positive for headaches and numbness.  Psychiatric/Behavioral: Positive for sleep disturbance.  All other systems reviewed and are negative.    PAST MEDICAL/SURGICAL HISTORY:  Past Medical History:  Diagnosis Date  . Allergy   . Cancer ([place]) 05/9416   follicular lymphoma  . Dysphagia   . GERD (gastroesophageal reflux disease)   . Thyroid disease    Hypothyroidism  . Thyroid nodule 2020   Past Surgical History:  Procedure Laterality Date  . ABDOMINAL HYSTERECTOMY    . ESOPHAGOGASTRODUODENOSCOPY N/A 07/13/2019   Procedure: ESOPHAGOGASTRODUODENOSCOPY (EGD);  Surgeon: Daneil Dolin, MD; normal esophagus s/p dilation and biopsied, normal stomach, patchy areas of whitish, nodular mucosa in the second portion of the duodenum s/p biopsy.  Duodenal biopsy with low-grade follicular lymphoma.  Esophageal biopsies benign without increased intraepithelial eosinophils.  Marland Kitchen KIDNEY STONE SURGERY    . MALONEY DILATION N/A 07/13/2019   Procedure: Venia Minks DILATION;  Surgeon: Daneil Dolin, MD;  Location[facility];  Service:  Endoscopy;  Laterality: N/A;  . TONSILLECTOMY     Around age 2  . WISDOM TOOTH EXTRACTION       SOCIAL HISTORY:  Social History   Socioeconomic History  . Marital status: Married    Spouse name: Not on file  . Number of children: 2  . Years of education: Not on file  . Highest education level: Not on file  Occupational History    Employer: [employer]  Tobacco Use  . Smoking status: Never Smoker  . Smokeless tobacco: Never Used  Substance and Sexual Activity  . Alcohol use: Yes    Comment: occasional- maybe once a month  . Drug use: No  . Sexual activity: Yes    Birth control/protection: Surgical    Comment: married 14 years  Other Topics Concern  . Not on file  Social History Narrative  . Not on file   Social Determinants of Health   Financial Resource Strain: Low Risk   . Difficulty of Paying Living Expenses: Not hard at all  Food Insecurity: No Food Insecurity  . Worried About Charity fundraiser in the Last Year: Never true  . Ran Out of Food in the Last Year: Never true  Transportation Needs: No Transportation Needs  . Lack of Transportation (Medical): No  . Lack of Transportation (Non-Medical): No  Physical Activity: Insufficiently Active  . Days of Exercise per Week: 5 days  . Minutes of Exercise per Session: 20 min  Stress: Stress Concern Present  . Feeling of Stress : To some extent  Social Connections: Somewhat Isolated  . Frequency of Communication with Friends and Family: More than three times a week  . Frequency of Social Gatherings with Friends and Family: More than three times a week  . Attends Religious Services: Never  . Active Member of Clubs or Organizations: No  . Attends Archivist Meetings: Never  . Marital Status: Married  Human resources officer Violence: Not At Risk  . Fear of Current or Ex-Partner: No  . Emotionally Abused: No  . Physically Abused: No  . Sexually Abused: No    FAMILY HISTORY:  Family History  Problem  Relation Age of Onset  . Thrombocytopenia Mother        itp  . Heart disease Mother   . Hypertension Mother   . Seizures Brother   . ADD / ADHD Brother   . Colon cancer Paternal Grandfather        in 77s  . Diabetes Maternal Grandmother   . Stroke Maternal Grandmother   . Heart disease Maternal Grandfather   . Stroke Maternal Grandfather   . Hypothyroidism Paternal Grandmother   . Psoriasis Brother   . Asthma Daughter   . Kidney disease Maternal Uncle   . Non-Hodgkin's lymphoma Paternal Uncle   . Leukemia Paternal Great-grandmother   . Ovarian cancer Maternal Great-grandmother   . Breast cancer Other        maternal great aunt  . Colon polyps Neg Hx     CURRENT MEDICATIONS:  Outpatient Encounter Medications as of 11/18/2019  Medication Sig  . Multiple Vitamins-Minerals (MULTIVITAMIN ADULT PO) Take 1 tablet by mouth daily.   . pantoprazole (PROTONIX) 40 MG tablet Take 1  tablet (40 mg total) by mouth 2 (two) times daily before a meal.  . riTUXimab in sodium chloride 0.9 % 250 mL Inject into the vein once a week. Weekly x 4 cycles  . SYNTHROID 100 MCG tablet Take 100 mcg by mouth every morning.  . fexofenadine (ALLEGRA) 180 MG tablet Take 180 mg by mouth daily as needed for allergies.   Marland Kitchen prochlorperazine (COMPAZINE) 10 MG tablet Take 1 tablet (10 mg total) by mouth every 6 (six) hours as needed for nausea or vomiting.   No facility-administered encounter medications on file as of 11/18/2019.    ALLERGIES:  Allergies  Allergen Reactions  . Latex Rash     PHYSICAL EXAM:  ECOG Performance status: 1  Vitals:   11/18/19 0834  BP: 118/83  Pulse: 77  Resp: 18  Temp: (!) 96.9 F (36.1 C)  SpO2: 100%   Filed Weights   11/18/19 0834  Weight: 200 lb 9.6 oz (91 kg)   Physical Exam Constitutional:      Appearance: Normal appearance. She is normal weight.  Cardiovascular:     Rate and Rhythm: Normal rate and regular rhythm.     Heart sounds: Normal heart sounds.    Pulmonary:     Effort: Pulmonary effort is normal.     Breath sounds: Normal breath sounds.  Abdominal:     General: Bowel sounds are normal.     Palpations: Abdomen is soft.  Musculoskeletal:        General: Normal range of motion.  Skin:    General: Skin is warm.  Neurological:     Mental Status: She is alert and oriented to person, place, and time. Mental status is at baseline.  Psychiatric:        Mood and Affect: Mood normal.        Behavior: Behavior normal.        Thought Content: Thought content normal.        Judgment: Judgment normal.      LABORATORY DATA:  I have reviewed the labs as listed.  CBC    Component Value Date/Time   WBC 5.9 11/18/2019 0809   RBC 4.62 11/18/2019 0809   HGB 13.0 11/18/2019 0809   HGB 13.1 12/04/2017 1012   HCT 38.9 11/18/2019 0809   HCT 37.8 12/04/2017 1012   PLT 200 11/18/2019 0809   PLT 202 12/04/2017 1012   MCV 84.2 11/18/2019 0809   MCV 82 12/04/2017 1012   MCH 28.1 11/18/2019 0809   MCHC 33.4 11/18/2019 0809   RDW 12.5 11/18/2019 0809   RDW 13.3 12/04/2017 1012   LYMPHSABS 1.6 11/18/2019 0809   LYMPHSABS 2.3 12/04/2017 1012   MONOABS 0.5 11/18/2019 0809   EOSABS 0.2 11/18/2019 0809   EOSABS 0.2 12/04/2017 1012   BASOSABS 0.1 11/18/2019 0809   BASOSABS 0.0 12/04/2017 1012   CMP Latest Ref Rng & Units 11/18/2019 11/11/2019 11/04/2019  Glucose 70 - 99 mg/dL 131(H) 99 83  BUN 6 - 20 mg/dL 12 15 12   Creatinine 0.44 - 1.00 mg/dL 0.67 0.68 0.68  Sodium 135 - 145 mmol/L 137 138 139  Potassium 3.5 - 5.1 mmol/L 4.0 3.9 4.2  Chloride 98 - 111 mmol/L 105 105 103  CO2 22 - 32 mmol/L 24 25 26   Calcium 8.9 - 10.3 mg/dL 8.9 9.7 9.7  Total Protein 6.5 - 8.1 g/dL 7.0 7.6 7.6  Total Bilirubin 0.3 - 1.2 mg/dL 1.0 1.4(H) 0.8  Alkaline Phos 38 - 126 U/L 64 67  67  AST 15 - 41 U/L 21 21 32  ALT 0 - 44 U/L 18 24 48(H)    All questions were answered to patient's stated satisfaction. Encouraged patient to call with any new concerns or  questions before his next visit to the cancer center and we can certain see him sooner, if needed.     ASSESSMENT & PLAN:  Follicular lymphoma ([place]) 1.  Stage I AE duodenal follicular lymphoma: -EGD on 07/13/2019 shows patchy areas of whitish, nodular mucosa in the second part of duodenum, biopsy consistent with grade 1-2 follicular lymphoma: -PET scan on 07/25/2019 showed very mild hypermetabolic along the second portion of the duodenum, maximum SUV 3.6. -No B symptoms.  She lost some weight due to digestive problems. -Bone marrow biopsy on 08/02/2019 showed normocellular marrow with trilineage hematopoiesis. -She has acid reflux symptoms which improved mildly after starting gluten-free diet.  Continues to have nausea and diarrhea. -Needs repeat EGD and biopsy at least 3 to 6 months after completion of rituximab.  We will do scans in 6 months. -10/27/2019 she was started on rituximab her rate was increased to 100 and she developed rigors and chills. Rituximab was held Demerol 50 mg IV was given she later developed nausea.  She was given Zofran 8 mg IV.  Rituximab was started back at 200 mg/h rate and she developed increasing heart rate to the 90s.  And she was slightly anxious.  She had successful completed rituximab infusion that day. -Labs done on 11/18/2019 showed all labs are WNL. -11/18/2019-fourth dose of rituximab -She will follow up with repeat labs.  2.  Hashimoto's thyroiditis: -She is on Synthroid 100 mcg daily.  3.  Family history: -Paternal uncle had DLBCL. -Paternal grandmother had leukemia. -Paternal grandfather had colon cancer. -Maternal great aunt had breast cancer. -Maternal great grandmother had ovarian cancer. -Mother had ITP.     Orders placed this encounter:  Orders Placed This Encounter  Procedures  . Lactate dehydrogenase  . Magnesium  . CBC with Differential/Platelet  . Comprehensive metabolic panel  . Uric acid      Francene Finders, FNP-C [facility] [phone]

## 2019-11-21 ENCOUNTER — Other Ambulatory Visit (HOSPITAL_COMMUNITY): Payer: Self-pay | Admitting: Hematology

## 2019-11-21 ENCOUNTER — Telehealth (HOSPITAL_COMMUNITY): Payer: Self-pay | Admitting: Surgery

## 2019-11-21 NOTE — Telephone Encounter (Signed)
Pt had left a message asking for clarification on her schedule.  Pt stated that she had completed 4 rounds of Rituximab and thought she did not have any more infusions, but on her schedule she had 2 follow up infusions for May 7 and May 14.  Per Dr. Delton Coombes, pt should have no more infusions.  I called the pt and she verbalized understanding that she is to come in this Friday just for labs and her appointment with Francene Finders.

## 2019-11-25 ENCOUNTER — Inpatient Hospital Stay (HOSPITAL_BASED_OUTPATIENT_CLINIC_OR_DEPARTMENT_OTHER): Payer: BC Managed Care – PPO | Admitting: Nurse Practitioner

## 2019-11-25 ENCOUNTER — Ambulatory Visit (HOSPITAL_COMMUNITY): Payer: BC Managed Care – PPO

## 2019-11-25 ENCOUNTER — Inpatient Hospital Stay (HOSPITAL_COMMUNITY): Payer: BC Managed Care – PPO | Attending: Hematology

## 2019-11-25 ENCOUNTER — Other Ambulatory Visit: Payer: Self-pay

## 2019-11-25 DIAGNOSIS — C8209 Follicular lymphoma grade I, extranodal and solid organ sites: Secondary | ICD-10-CM

## 2019-11-25 DIAGNOSIS — E063 Autoimmune thyroiditis: Secondary | ICD-10-CM | POA: Diagnosis not present

## 2019-11-25 LAB — COMPREHENSIVE METABOLIC PANEL
ALT: 18 U/L (ref 0–44)
AST: 18 U/L (ref 15–41)
Albumin: 4.3 g/dL (ref 3.5–5.0)
Alkaline Phosphatase: 71 U/L (ref 38–126)
Anion gap: 12 (ref 5–15)
BUN: 12 mg/dL (ref 6–20)
CO2: 25 mmol/L (ref 22–32)
Calcium: 9.6 mg/dL (ref 8.9–10.3)
Chloride: 102 mmol/L (ref 98–111)
Creatinine, Ser: 0.65 mg/dL (ref 0.44–1.00)
GFR calc Af Amer: >60 mL/min (ref >60)
GFR calc non Af Amer: >60 mL/min (ref >60)
Glucose, Bld: 98 mg/dL (ref 70–99)
Potassium: 4.3 mmol/L (ref 3.5–5.1)
Sodium: 139 mmol/L (ref 135–145)
Total Bilirubin: 1 mg/dL (ref 0.3–1.2)
Total Protein: 7.6 g/dL (ref 6.5–8.1)

## 2019-11-25 LAB — CBC WITH DIFFERENTIAL/PLATELET
Abs Immature Granulocytes: 0.01 10*3/uL (ref 0.00–0.07)
Basophils Absolute: 0.1 10*3/uL (ref 0.0–0.1)
Basophils Relative: 1 %
Eosinophils Absolute: 0.1 10*3/uL (ref 0.0–0.5)
Eosinophils Relative: 2 %
HCT: 38.9 % (ref 36.0–46.0)
Hemoglobin: 12.8 g/dL (ref 12.0–15.0)
Immature Granulocytes: 0 %
Lymphocytes Relative: 26 %
Lymphs Abs: 1.6 10*3/uL (ref 0.7–4.0)
MCH: 28.1 pg (ref 26.0–34.0)
MCHC: 32.9 g/dL (ref 30.0–36.0)
MCV: 85.3 fL (ref 80.0–100.0)
Monocytes Absolute: 0.5 10*3/uL (ref 0.1–1.0)
Monocytes Relative: 8 %
Neutro Abs: 3.8 10*3/uL (ref 1.7–7.7)
Neutrophils Relative %: 63 %
Platelets: 192 10*3/uL (ref 150–400)
RBC: 4.56 MIL/uL (ref 3.87–5.11)
RDW: 12.5 % (ref 11.5–15.5)
WBC: 6 10*3/uL (ref 4.0–10.5)
nRBC: 0 % (ref 0.0–0.2)

## 2019-11-25 LAB — URIC ACID: Uric Acid, Serum: 5.1 mg/dL (ref 2.5–7.1)

## 2019-11-25 LAB — MAGNESIUM: Magnesium: 2 mg/dL (ref 1.7–2.4)

## 2019-11-25 LAB — LACTATE DEHYDROGENASE: LDH: 126 U/L (ref 98–192)

## 2019-11-25 NOTE — Progress Notes (Signed)
Betty Hayes, Betty Hayes [address]   CLINIC:  Medical Oncology/Hematology  PCP:  Betty Hayes, Betty Kaufmann, MD [place] [address] [phone]   REASON FOR VISIT: Follow-up for follicular lymphoma  CURRENT THERAPY: 4 doses of rituximab completed  BRIEF ONCOLOGIC HISTORY:  Oncology History  Follicular lymphoma ([place])  07/28/2019 Initial Diagnosis   Follicular lymphoma ([place])   09/29/2019 Cancer Staging   Staging form: Hodgkin and Non-Hodgkin Lymphoma, AJCC 8th Edition - Clinical stage from 5/94/5859: Stage IE (Follicular lymphoma) - Signed by Betty Jack, MD on 2/92/4462   Grade I follicular lymphoma of extranodal site excluding spleen and other solid organs ([place])  09/29/2019 Initial Diagnosis   Grade I follicular lymphoma of extranodal site excluding spleen and other solid organs ([place])   10/27/2019 -  Chemotherapy   The patient had ondansetron (ZOFRAN) injection 8 mg, 8 mg (100 % of original dose 8 mg), Intravenous,  Once, 3 of 3 cycles Dose modification: 8 mg (original dose 8 mg, Cycle 2) ondansetron (ZOFRAN) 8 mg in sodium chloride 0.9 % 50 mL IVPB, , Intravenous,  Once, 3 of 3 cycles Administration: 8 mg (11/04/2019), 8 mg (11/11/2019), 8 mg (11/18/2019) riTUXimab-pvvr (RUXIENCE) 800 mg in sodium chloride 0.9 % 250 mL (2.4242 mg/mL) infusion, 375 mg/m2 = 800 mg, Intravenous,  Once, 4 of 4 cycles Administration: 800 mg (10/27/2019), 800 mg (11/04/2019), 800 mg (11/11/2019), 800 mg (11/18/2019)  for chemotherapy treatment.      CANCER STAGING: Cancer Staging Follicular lymphoma ([place]) Staging form: Hodgkin and Non-Hodgkin Lymphoma, AJCC 8th Edition - Clinical stage from 8/63/8177: Stage IE (Follicular lymphoma) - Signed by Betty Jack, MD on 09/29/2019    INTERVAL HISTORY:  Betty Hayes 34 y.o. female returns for routine follow-up for follicular lymphoma.  Patient reports she did have nausea and diarrhea for a day or 2 after  treatment.  Otherwise she is doing well.  She denies any new abdominal pain. Denies any vomiting. Denies any new pains. Had not noticed any recent bleeding such as epistaxis, hematuria or hematochezia. Denies recent chest pain on exertion, shortness of breath on minimal exertion, pre-syncopal episodes, or palpitations. Denies any numbness or tingling in hands or feet. Denies any recent fevers, infections, or recent hospitalizations. Patient reports appetite at 50% and energy level at 25%.  She is eating well maintain her weight this time.     REVIEW OF SYSTEMS:  Review of Systems  Constitutional: Positive for fatigue.  Cardiovascular: Positive for leg swelling.  Gastrointestinal: Positive for diarrhea and nausea.  All other systems reviewed and are negative.    PAST MEDICAL/SURGICAL HISTORY:  Past Medical History:  Diagnosis Date  . Allergy   . Cancer ([place]) 05/6578   follicular lymphoma  . Dysphagia   . GERD (gastroesophageal reflux disease)   . Thyroid disease    Hypothyroidism  . Thyroid nodule 2020   Past Surgical History:  Procedure Laterality Date  . ABDOMINAL HYSTERECTOMY    . ESOPHAGOGASTRODUODENOSCOPY N/A 07/13/2019   Procedure: ESOPHAGOGASTRODUODENOSCOPY (EGD);  Surgeon: Betty Dolin, MD; normal esophagus s/p dilation and biopsied, normal stomach, patchy areas of whitish, nodular mucosa in the second portion of the duodenum s/p biopsy.  Duodenal biopsy with low-grade follicular lymphoma.  Esophageal biopsies benign without increased intraepithelial eosinophils.  Marland Kitchen KIDNEY STONE SURGERY    . MALONEY DILATION N/A 07/13/2019   Procedure: Betty Hayes DILATION;  Surgeon: Betty Dolin, MD;  Location[facility];  Service: Endoscopy;  Laterality: N/A;  . TONSILLECTOMY     Around age 1  . WISDOM TOOTH EXTRACTION       SOCIAL HISTORY:  Social History   Socioeconomic History  . Marital status: Married    Spouse name: Not on file  . Number of children: 2  . Years of  education: Not on file  . Highest education level: Not on file  Occupational History    Employer: [employer]  Tobacco Use  . Smoking status: Never Smoker  . Smokeless tobacco: Never Used  Substance and Sexual Activity  . Alcohol use: Yes    Comment: occasional- maybe once a month  . Drug use: No  . Sexual activity: Yes    Birth control/protection: Surgical    Comment: married 14 years  Other Topics Concern  . Not on file  Social History Narrative  . Not on file   Social Determinants of Health   Financial Resource Strain: Low Risk   . Difficulty of Paying Living Expenses: Not hard at all  Food Insecurity: No Food Insecurity  . Worried About Charity fundraiser in the Last Year: Never true  . Ran Out of Food in the Last Year: Never true  Transportation Needs: No Transportation Needs  . Lack of Transportation (Medical): No  . Lack of Transportation (Non-Medical): No  Physical Activity: Insufficiently Active  . Days of Exercise per Week: 5 days  . Minutes of Exercise per Session: 20 min  Stress: Stress Concern Present  . Feeling of Stress : To some extent  Social Connections: Somewhat Isolated  . Frequency of Communication with Friends and Family: More than three times a week  . Frequency of Social Gatherings with Friends and Family: More than three times a week  . Attends Religious Services: Never  . Active Member of Clubs or Organizations: No  . Attends Archivist Meetings: Never  . Marital Status: Married  Human resources officer Violence: Not At Risk  . Fear of Current or Ex-Partner: No  . Emotionally Abused: No  . Physically Abused: No  . Sexually Abused: No    FAMILY HISTORY:  Family History  Problem Relation Age of Onset  . Thrombocytopenia Mother        itp  . Heart disease Mother   . Hypertension Mother   . Seizures Brother   . ADD / ADHD Brother   . Colon cancer Paternal Grandfather        in 49s  . Diabetes Maternal Grandmother   . Stroke  Maternal Grandmother   . Heart disease Maternal Grandfather   . Stroke Maternal Grandfather   . Hypothyroidism Paternal Grandmother   . Psoriasis Brother   . Asthma Daughter   . Kidney disease Maternal Uncle   . Non-Hodgkin's lymphoma Paternal Uncle   . Leukemia Paternal Great-grandmother   . Ovarian cancer Maternal Great-grandmother   . Breast cancer Other        maternal great aunt  . Colon polyps Neg Hx     CURRENT MEDICATIONS:  Outpatient Encounter Medications as of 11/25/2019  Medication Sig  . Multiple Vitamins-Minerals (MULTIVITAMIN ADULT PO) Take 1 tablet by mouth daily.   . pantoprazole (PROTONIX) 40 MG tablet Take 1 tablet (40 mg total) by mouth 2 (two) times daily before a meal.  . riTUXimab in sodium chloride 0.9 % 250 mL Inject into the vein once a week. Weekly x 4 cycles  . SYNTHROID 100 MCG tablet Take 100 mcg by mouth every morning.  Marland Kitchen  fexofenadine (ALLEGRA) 180 MG tablet Take 180 mg by mouth daily as needed for allergies.   Marland Kitchen prochlorperazine (COMPAZINE) 10 MG tablet Take 1 tablet (10 mg total) by mouth every 6 (six) hours as needed for nausea or vomiting. (Patient not taking: Reported on 11/25/2019)   No facility-administered encounter medications on file as of 11/25/2019.    ALLERGIES:  Allergies  Allergen Reactions  . Latex Rash     PHYSICAL EXAM:  ECOG Performance status: 1  Vitals:   11/25/19 0829  BP: 110/80  Pulse: 63  Resp: 18  Temp: (!) 97.1 F (36.2 C)  SpO2: 99%   Filed Weights   11/25/19 0829  Weight: 200 lb 9.6 oz (91 kg)    Physical Exam Constitutional:      Appearance: Normal appearance. She is normal weight.  Cardiovascular:     Rate and Rhythm: Normal rate and regular rhythm.     Heart sounds: Normal heart sounds.  Pulmonary:     Effort: Pulmonary effort is normal.     Breath sounds: Normal breath sounds.  Abdominal:     General: Bowel sounds are normal.     Palpations: Abdomen is soft.  Musculoskeletal:        General:  Normal range of motion.  Skin:    General: Skin is warm.  Neurological:     Mental Status: She is alert and oriented to person, place, and time. Mental status is at baseline.  Psychiatric:        Mood and Affect: Mood normal.        Behavior: Behavior normal.        Thought Content: Thought content normal.        Judgment: Judgment normal.      LABORATORY DATA:  I have reviewed the labs as listed.  CBC    Component Value Date/Time   WBC 6.0 11/25/2019 0808   RBC 4.56 11/25/2019 0808   HGB 12.8 11/25/2019 0808   HGB 13.1 12/04/2017 1012   HCT 38.9 11/25/2019 0808   HCT 37.8 12/04/2017 1012   PLT 192 11/25/2019 0808   PLT 202 12/04/2017 1012   MCV 85.3 11/25/2019 0808   MCV 82 12/04/2017 1012   MCH 28.1 11/25/2019 0808   MCHC 32.9 11/25/2019 0808   RDW 12.5 11/25/2019 0808   RDW 13.3 12/04/2017 1012   LYMPHSABS 1.6 11/25/2019 0808   LYMPHSABS 2.3 12/04/2017 1012   MONOABS 0.5 11/25/2019 0808   EOSABS 0.1 11/25/2019 0808   EOSABS 0.2 12/04/2017 1012   BASOSABS 0.1 11/25/2019 0808   BASOSABS 0.0 12/04/2017 1012   CMP Latest Ref Rng & Units 11/25/2019 11/18/2019 11/11/2019  Glucose 70 - 99 mg/dL 98 131(H) 99  BUN 6 - 20 mg/dL 12 12 15   Creatinine 0.44 - 1.00 mg/dL 0.65 0.67 0.68  Sodium 135 - 145 mmol/L 139 137 138  Potassium 3.5 - 5.1 mmol/L 4.3 4.0 3.9  Chloride 98 - 111 mmol/L 102 105 105  CO2 22 - 32 mmol/L 25 24 25   Calcium 8.9 - 10.3 mg/dL 9.6 8.9 9.7  Total Protein 6.5 - 8.1 g/dL 7.6 7.0 7.6  Total Bilirubin 0.3 - 1.2 mg/dL 1.0 1.0 1.4(H)  Alkaline Phos 38 - 126 U/L 71 64 67  AST 15 - 41 U/L 18 21 21   ALT 0 - 44 U/L 18 18 24     All questions were answered to patient's stated satisfaction. Encouraged patient to call with any new concerns or questions before his next  visit to the cancer center and we can certain see him sooner, if needed.     ASSESSMENT & PLAN:  Follicular lymphoma ([place]) 1.  Stage I AE duodenal follicular lymphoma: -EGD on 07/13/2019 shows  patchy areas of whitish, nodular mucosa in the second part of duodenum, biopsy consistent with grade 1-2 follicular lymphoma: -PET scan on 07/25/2019 showed very mild hypermetabolic along the second portion of the duodenum, maximum SUV 3.6. -No B symptoms.  She lost some weight due to digestive problems. -Bone marrow biopsy on 08/02/2019 showed normocellular marrow with trilineage hematopoiesis. -She has acid reflux symptoms which improved mildly after starting gluten-free diet.  Continues to have nausea and diarrhea. -Needs repeat EGD and biopsy at least 3 to 6 months after completion of rituximab.  We will do scans in 6 months. -10/27/2019 she was started on rituximab her rate was increased to 100 and she developed rigors and chills. Rituximab was held Demerol 50 mg IV was given she later developed nausea.  She was given Zofran 8 mg IV.  Rituximab was started back at 200 mg/h rate and she developed increasing heart rate to the 90s.  And she was slightly anxious.  She had successful completed rituximab infusion that day. -Fourth dose of rituximab completed on 11/18/2019 -Labs done on 11/25/2019 were all WNL -She will follow up in 2 months with repeat labs  2.  Hashimoto's thyroiditis: -She is on Synthroid 100 mcg daily.  3.  Family history: -Paternal uncle had DLBCL. -Paternal grandmother had leukemia. -Paternal grandfather had colon cancer. -Maternal great aunt had breast cancer. -Maternal great grandmother had ovarian cancer. -Mother had ITP.     Orders placed this encounter:  Orders Placed This Encounter  Procedures  . Lactate dehydrogenase  . Magnesium  . CBC with Differential/Platelet  . Comprehensive metabolic panel  . Uric acid      Francene Finders, FNP-C [facility] [phone]

## 2019-11-25 NOTE — Assessment & Plan Note (Signed)
1.  Stage I AE duodenal follicular lymphoma: -EGD on 07/13/2019 shows patchy areas of whitish, nodular mucosa in the second part of duodenum, biopsy consistent with grade 1-2 follicular lymphoma: -PET scan on 07/25/2019 showed very mild hypermetabolic along the second portion of the duodenum, maximum SUV 3.6. -No B symptoms.  She lost some weight due to digestive problems. -Bone marrow biopsy on 08/02/2019 showed normocellular marrow with trilineage hematopoiesis. -She has acid reflux symptoms which improved mildly after starting gluten-free diet.  Continues to have nausea and diarrhea. -Needs repeat EGD and biopsy at least 3 to 6 months after completion of rituximab.  We will do scans in 6 months. -10/27/2019 she was started on rituximab her rate was increased to 100 and she developed rigors and chills. Rituximab was held Demerol 50 mg IV was given she later developed nausea.  She was given Zofran 8 mg IV.  Rituximab was started back at 200 mg/h rate and she developed increasing heart rate to the 90s.  And she was slightly anxious.  She had successful completed rituximab infusion that day. -Fourth dose of rituximab completed on 11/18/2019 -Labs done on 11/25/2019 were all WNL -She will follow up in 2 months with repeat labs  2.  Hashimoto's thyroiditis: -She is on Synthroid 100 mcg daily.  3.  Family history: -Paternal uncle had DLBCL. -Paternal grandmother had leukemia. -Paternal grandfather had colon cancer. -Maternal great aunt had breast cancer. -Maternal great grandmother had ovarian cancer. -Mother had ITP.

## 2019-12-02 ENCOUNTER — Ambulatory Visit (HOSPITAL_COMMUNITY): Payer: BC Managed Care – PPO | Admitting: Hematology

## 2019-12-02 ENCOUNTER — Other Ambulatory Visit (HOSPITAL_COMMUNITY): Payer: BC Managed Care – PPO

## 2019-12-02 ENCOUNTER — Ambulatory Visit (HOSPITAL_COMMUNITY): Payer: BC Managed Care – PPO

## 2019-12-28 DIAGNOSIS — Z888 Allergy status to other drugs, medicaments and biological substances status: Secondary | ICD-10-CM | POA: Diagnosis not present

## 2019-12-28 DIAGNOSIS — E063 Autoimmune thyroiditis: Secondary | ICD-10-CM | POA: Diagnosis not present

## 2019-12-28 DIAGNOSIS — E038 Other specified hypothyroidism: Secondary | ICD-10-CM | POA: Diagnosis not present

## 2019-12-28 DIAGNOSIS — Z91040 Latex allergy status: Secondary | ICD-10-CM | POA: Diagnosis not present

## 2020-01-02 ENCOUNTER — Other Ambulatory Visit (HOSPITAL_COMMUNITY): Payer: Self-pay | Admitting: *Deleted

## 2020-01-02 ENCOUNTER — Telehealth (HOSPITAL_COMMUNITY): Payer: Self-pay | Admitting: *Deleted

## 2020-01-02 MED ORDER — DICYCLOMINE HCL 10 MG PO CAPS: 10.0000 mg | ORAL_CAPSULE | Freq: Three times a day (TID) | ORAL | 1 refills | Status: DC

## 2020-01-02 NOTE — Telephone Encounter (Signed)
Pt called stating that she has had "weird" diarrhea since her last treatment. She states that she has had watery stools no more than 5 times a day. She states that she doesn't like to take medication so she has not used the imodium but did try Pepto which helped with the diarrhea but didn't touch the abdominal cramping she has. She states she would like to know if there was something natural that could help with the diarrhea.  I spoke with Dr. Delton Coombes about above and he advised that she could try taking Bentyl for the cramping and he was not aware of anything natural to help with the diarrhea. He also advised that we refer her to a GI specialist to further evaluate her.   I advised the patient of above and she stated that she already has a GI specialist and that she will call them and get an appointment. She also wanted the prescription to be called in to her pharmacy for Bentyl.   Prescription was sent in and pt was advised to call us back if she had any further questions. Pt verbalized understanding.

## 2020-01-09 ENCOUNTER — Telehealth: Payer: Self-pay | Admitting: Internal Medicine

## 2020-01-09 NOTE — Telephone Encounter (Signed)
Noted  

## 2020-01-09 NOTE — Telephone Encounter (Signed)
Pt called asking to speak with nurse. She is aware of OV in July and she is on cancellation list as well. [phone]

## 2020-01-09 NOTE — Telephone Encounter (Signed)
Pt was seen 07/2019 by [facility]. Pt's oncologist asked pt to call our office since she has been having diarrhea x 2 weeks with abdominal cramping/mild nausea. Pt's diarrhea has been watery and the last couple days, pt can see chunks of food in the toilet(stool is still watery per pt). Pt has 2-3 BM daily. Pt reports no vomiting but feels she may want to at times. Pt has a nausea med at home to use for nausea given by her oncologist but tries to avoid taking it. Abdominal cramping happens when pt may need to have a BM, after eating or 2 hours after eating. Pt hasn't found any triggers that can be causing the diarrhea. Pt follows a gluten free diet and tries to avoid dairy as much as possible. Pt isn't eating much since the food seems to come out of her.

## 2020-01-09 NOTE — Telephone Encounter (Signed)
FYI Spoke with pt. Pt is aware of AB recommendations. I was going to order Stool testing and pt said she forgot to mention that her doctor called in Bentyl. Pt was asked if she would like to complete the stool testing prior to starting Bentyl. Pt refuses at this time and will call back if the Bentyl isn't working. Pt is aware that she is to watch out for constipation.

## 2020-01-09 NOTE — Telephone Encounter (Signed)
Communication noted. Agree with office visit, next available, as well as stool testing if diarrhea continues despite Bentyl.

## 2020-01-09 NOTE — Telephone Encounter (Signed)
SHe was not having diarrhea in Jan 2021 when she was seen. Recommend Cdiff and GI pathogen panel. Please put her in an appt with Cyril Mourning first available in meantime. Follow soft diet for now.

## 2020-01-10 NOTE — Telephone Encounter (Signed)
Noted  

## 2020-01-24 ENCOUNTER — Inpatient Hospital Stay (HOSPITAL_COMMUNITY): Payer: BC Managed Care – PPO | Attending: Hematology

## 2020-01-24 ENCOUNTER — Other Ambulatory Visit: Payer: Self-pay

## 2020-01-24 DIAGNOSIS — C8203 Follicular lymphoma grade I, intra-abdominal lymph nodes: Secondary | ICD-10-CM | POA: Diagnosis not present

## 2020-01-24 DIAGNOSIS — Z841 Family history of disorders of kidney and ureter: Secondary | ICD-10-CM | POA: Insufficient documentation

## 2020-01-24 DIAGNOSIS — Z8349 Family history of other endocrine, nutritional and metabolic diseases: Secondary | ICD-10-CM | POA: Insufficient documentation

## 2020-01-24 DIAGNOSIS — Z8041 Family history of malignant neoplasm of ovary: Secondary | ICD-10-CM | POA: Diagnosis not present

## 2020-01-24 DIAGNOSIS — Z833 Family history of diabetes mellitus: Secondary | ICD-10-CM | POA: Insufficient documentation

## 2020-01-24 DIAGNOSIS — K219 Gastro-esophageal reflux disease without esophagitis: Secondary | ICD-10-CM | POA: Insufficient documentation

## 2020-01-24 DIAGNOSIS — E063 Autoimmune thyroiditis: Secondary | ICD-10-CM | POA: Diagnosis not present

## 2020-01-24 DIAGNOSIS — Z8249 Family history of ischemic heart disease and other diseases of the circulatory system: Secondary | ICD-10-CM | POA: Diagnosis not present

## 2020-01-24 DIAGNOSIS — R197 Diarrhea, unspecified: Secondary | ICD-10-CM | POA: Insufficient documentation

## 2020-01-24 DIAGNOSIS — R110 Nausea: Secondary | ICD-10-CM | POA: Insufficient documentation

## 2020-01-24 DIAGNOSIS — R109 Unspecified abdominal pain: Secondary | ICD-10-CM | POA: Diagnosis not present

## 2020-01-24 DIAGNOSIS — Z800 Family history of malignant neoplasm of digestive organs: Secondary | ICD-10-CM | POA: Insufficient documentation

## 2020-01-24 DIAGNOSIS — Z820 Family history of epilepsy and other diseases of the nervous system: Secondary | ICD-10-CM | POA: Diagnosis not present

## 2020-01-24 DIAGNOSIS — R5383 Other fatigue: Secondary | ICD-10-CM | POA: Diagnosis not present

## 2020-01-24 DIAGNOSIS — Z823 Family history of stroke: Secondary | ICD-10-CM | POA: Insufficient documentation

## 2020-01-24 DIAGNOSIS — Z803 Family history of malignant neoplasm of breast: Secondary | ICD-10-CM | POA: Insufficient documentation

## 2020-01-24 DIAGNOSIS — Z79899 Other long term (current) drug therapy: Secondary | ICD-10-CM | POA: Insufficient documentation

## 2020-01-24 DIAGNOSIS — R634 Abnormal weight loss: Secondary | ICD-10-CM | POA: Diagnosis not present

## 2020-01-24 DIAGNOSIS — Z808 Family history of malignant neoplasm of other organs or systems: Secondary | ICD-10-CM | POA: Insufficient documentation

## 2020-01-24 DIAGNOSIS — E669 Obesity, unspecified: Secondary | ICD-10-CM | POA: Diagnosis not present

## 2020-01-24 DIAGNOSIS — C8209 Follicular lymphoma grade I, extranodal and solid organ sites: Secondary | ICD-10-CM

## 2020-01-24 DIAGNOSIS — Z836 Family history of other diseases of the respiratory system: Secondary | ICD-10-CM | POA: Diagnosis not present

## 2020-01-24 DIAGNOSIS — R200 Anesthesia of skin: Secondary | ICD-10-CM | POA: Insufficient documentation

## 2020-01-24 DIAGNOSIS — Z806 Family history of leukemia: Secondary | ICD-10-CM | POA: Insufficient documentation

## 2020-01-24 LAB — CBC WITH DIFFERENTIAL/PLATELET
Abs Immature Granulocytes: 0.02 10*3/uL (ref 0.00–0.07)
Basophils Absolute: 0.1 10*3/uL (ref 0.0–0.1)
Basophils Relative: 1 %
Eosinophils Absolute: 0.1 10*3/uL (ref 0.0–0.5)
Eosinophils Relative: 3 %
HCT: 37.7 % (ref 36.0–46.0)
Hemoglobin: 12.3 g/dL (ref 12.0–15.0)
Immature Granulocytes: 0 %
Lymphocytes Relative: 33 %
Lymphs Abs: 1.7 10*3/uL (ref 0.7–4.0)
MCH: 28 pg (ref 26.0–34.0)
MCHC: 32.6 g/dL (ref 30.0–36.0)
MCV: 85.9 fL (ref 80.0–100.0)
Monocytes Absolute: 0.5 10*3/uL (ref 0.1–1.0)
Monocytes Relative: 10 %
Neutro Abs: 2.7 10*3/uL (ref 1.7–7.7)
Neutrophils Relative %: 53 %
Platelets: 197 10*3/uL (ref 150–400)
RBC: 4.39 MIL/uL (ref 3.87–5.11)
RDW: 12.8 % (ref 11.5–15.5)
WBC: 5.2 10*3/uL (ref 4.0–10.5)
nRBC: 0 % (ref 0.0–0.2)

## 2020-01-24 LAB — COMPREHENSIVE METABOLIC PANEL
ALT: 25 U/L (ref 0–44)
AST: 18 U/L (ref 15–41)
Albumin: 4.1 g/dL (ref 3.5–5.0)
Alkaline Phosphatase: 62 U/L (ref 38–126)
Anion gap: 7 (ref 5–15)
BUN: 9 mg/dL (ref 6–20)
CO2: 24 mmol/L (ref 22–32)
Calcium: 8.8 mg/dL — ABNORMAL LOW (ref 8.9–10.3)
Chloride: 108 mmol/L (ref 98–111)
Creatinine, Ser: 0.64 mg/dL (ref 0.44–1.00)
GFR calc Af Amer: >60 mL/min (ref >60)
GFR calc non Af Amer: >60 mL/min (ref >60)
Glucose, Bld: 94 mg/dL (ref 70–99)
Potassium: 4.3 mmol/L (ref 3.5–5.1)
Sodium: 139 mmol/L (ref 135–145)
Total Bilirubin: 1.2 mg/dL (ref 0.3–1.2)
Total Protein: 7 g/dL (ref 6.5–8.1)

## 2020-01-24 LAB — MAGNESIUM: Magnesium: 2.1 mg/dL (ref 1.7–2.4)

## 2020-01-24 LAB — LACTATE DEHYDROGENASE: LDH: 112 U/L (ref 98–192)

## 2020-01-24 LAB — URIC ACID: Uric Acid, Serum: 5.4 mg/dL (ref 2.5–7.1)

## 2020-01-25 ENCOUNTER — Other Ambulatory Visit: Payer: Self-pay

## 2020-01-25 ENCOUNTER — Inpatient Hospital Stay (HOSPITAL_BASED_OUTPATIENT_CLINIC_OR_DEPARTMENT_OTHER): Payer: BC Managed Care – PPO | Admitting: Hematology

## 2020-01-25 ENCOUNTER — Telehealth: Payer: Self-pay | Admitting: Emergency Medicine

## 2020-01-25 VITALS — BP 119/77 | HR 66 | Temp 97.7°F | Resp 18 | Wt 197.7 lb

## 2020-01-25 DIAGNOSIS — R197 Diarrhea, unspecified: Secondary | ICD-10-CM

## 2020-01-25 DIAGNOSIS — K219 Gastro-esophageal reflux disease without esophagitis: Secondary | ICD-10-CM | POA: Diagnosis not present

## 2020-01-25 DIAGNOSIS — E669 Obesity, unspecified: Secondary | ICD-10-CM | POA: Diagnosis not present

## 2020-01-25 DIAGNOSIS — Z833 Family history of diabetes mellitus: Secondary | ICD-10-CM | POA: Diagnosis not present

## 2020-01-25 DIAGNOSIS — Z8249 Family history of ischemic heart disease and other diseases of the circulatory system: Secondary | ICD-10-CM | POA: Diagnosis not present

## 2020-01-25 DIAGNOSIS — C8209 Follicular lymphoma grade I, extranodal and solid organ sites: Secondary | ICD-10-CM

## 2020-01-25 DIAGNOSIS — R5383 Other fatigue: Secondary | ICD-10-CM | POA: Diagnosis not present

## 2020-01-25 DIAGNOSIS — E063 Autoimmune thyroiditis: Secondary | ICD-10-CM | POA: Diagnosis not present

## 2020-01-25 DIAGNOSIS — Z803 Family history of malignant neoplasm of breast: Secondary | ICD-10-CM | POA: Diagnosis not present

## 2020-01-25 DIAGNOSIS — R200 Anesthesia of skin: Secondary | ICD-10-CM | POA: Diagnosis not present

## 2020-01-25 DIAGNOSIS — R634 Abnormal weight loss: Secondary | ICD-10-CM | POA: Diagnosis not present

## 2020-01-25 DIAGNOSIS — Z79899 Other long term (current) drug therapy: Secondary | ICD-10-CM | POA: Diagnosis not present

## 2020-01-25 DIAGNOSIS — R110 Nausea: Secondary | ICD-10-CM | POA: Diagnosis not present

## 2020-01-25 DIAGNOSIS — R109 Unspecified abdominal pain: Secondary | ICD-10-CM | POA: Diagnosis not present

## 2020-01-25 DIAGNOSIS — C8203 Follicular lymphoma grade I, intra-abdominal lymph nodes: Secondary | ICD-10-CM | POA: Diagnosis not present

## 2020-01-25 DIAGNOSIS — Z8349 Family history of other endocrine, nutritional and metabolic diseases: Secondary | ICD-10-CM | POA: Diagnosis not present

## 2020-01-25 DIAGNOSIS — Z8041 Family history of malignant neoplasm of ovary: Secondary | ICD-10-CM | POA: Diagnosis not present

## 2020-01-25 NOTE — Telephone Encounter (Signed)
Spoke with pt. Pt called to f/u from 01/09/20 conversation. Pt didn't want to do stool testing at that time and wanted to try meds. Stool testing was placed today as recommended for pt to do on 01/09/20. Orders placed for [facility].

## 2020-01-25 NOTE — Progress Notes (Signed)
[address] [place], [place] [address]   CLINIC:  Medical Oncology/Hematology  PCP:  Dettinger, Fransisca Kaufmann, MD [place] / [place] [address]  [phone]  REASON FOR VISIT:  Follow-up for low-grade follicular lymphoma of the duodenum  PRIOR THERAPY: Weekly rituximab x 4 cycles from 10/27/2019 through 11/18/2019  CURRENT THERAPY: Observation  INTERVAL HISTORY:  [name], a 34 y.o. female, returns for routine follow-up for her low-grade follicular lymphoma of the duodenum. Charizma was last seen on 10/27/2019.  Today she reports that the diarrhea, which started on 12/20/2019, is still intermittent as watery stool once daily. She was prescribed Bentyl and became constipated for about 1 week, then she stopped the Bentyl for a few days and started having foul-smelling watery stool without blood. The Bentyl has not stopped the right-sided abdominal cramping, which is similar to contractions, and not relieved by having a BM. She is currently taking Bentyl QAM and QPM. She is also taking spironolactone for acne on her neck which she started at the beginning of 12/2019; she took spironolactone in the past and did not have diarrhea before. She has not given a stool sample to gastro yet. She denies a history of Cdiff.   REVIEW OF SYSTEMS:  Review of Systems  Constitutional: Positive for appetite change (severely decreased) and fatigue (mild).  Gastrointestinal: Positive for abdominal pain (cramping) and diarrhea.  Neurological: Positive for numbness (hands & feet).  All other systems reviewed and are negative.   PAST MEDICAL/SURGICAL HISTORY:  Past Medical History:  Diagnosis Date  . Allergy   . Cancer ([facility]) 14/4818   follicular lymphoma  . Dysphagia   . GERD (gastroesophageal reflux disease)   . Thyroid disease    Hypothyroidism  . Thyroid nodule 2020   Past Surgical History:  Procedure Laterality Date  . ABDOMINAL HYSTERECTOMY    .  ESOPHAGOGASTRODUODENOSCOPY N/A 07/13/2019   Procedure: ESOPHAGOGASTRODUODENOSCOPY (EGD);  Surgeon: Daneil Dolin, MD; normal esophagus s/p dilation and biopsied, normal stomach, patchy areas of whitish, nodular mucosa in the second portion of the duodenum s/p biopsy.  Duodenal biopsy with low-grade follicular lymphoma.  Esophageal biopsies benign without increased intraepithelial eosinophils.  Marland Kitchen KIDNEY STONE SURGERY    . MALONEY DILATION N/A 07/13/2019   Procedure: Venia Minks DILATION;  Surgeon: Daneil Dolin, MD;  Location: [facility];  Service: Endoscopy;  Laterality: N/A;  . TONSILLECTOMY     Around age 43  . WISDOM TOOTH EXTRACTION      SOCIAL HISTORY:  Social History   Socioeconomic History  . Marital status: Married    Spouse name: Not on file  . Number of children: 2  . Years of education: Not on file  . Highest education level: Not on file  Occupational History    Employer: [employer]  Tobacco Use  . Smoking status: Never Smoker  . Smokeless tobacco: Never Used  Vaping Use  . Vaping Use: Never used  Substance and Sexual Activity  . Alcohol use: Yes    Comment: occasional- maybe once a month  . Drug use: No  . Sexual activity: Yes    Birth control/protection: Surgical    Comment: married 14 years  Other Topics Concern  . Not on file  Social History Narrative  . Not on file   Social Determinants of Health   Financial Resource Strain: Low Risk   . Difficulty of Paying Living Expenses: Not hard at all  Food Insecurity: No  Food Insecurity  . Worried About Charity fundraiser in the Last Year: Never true  . Ran Out of Food in the Last Year: Never true  Transportation Needs: No Transportation Needs  . Lack of Transportation (Medical): No  . Lack of Transportation (Non-Medical): No  Physical Activity: Insufficiently Active  . Days of Exercise per Week: 5 days  . Minutes of Exercise per Session: 20 min  Stress: Stress Concern Present  . Feeling of Stress :  To some extent  Social Connections: Moderately Isolated  . Frequency of Communication with Friends and Family: More than three times a week  . Frequency of Social Gatherings with Friends and Family: More than three times a week  . Attends Religious Services: Never  . Active Member of Clubs or Organizations: No  . Attends Archivist Meetings: Never  . Marital Status: Married  Human resources officer Violence: Not At Risk  . Fear of Current or Ex-Partner: No  . Emotionally Abused: No  . Physically Abused: No  . Sexually Abused: No    FAMILY HISTORY:  Family History  Problem Relation Age of Onset  . Thrombocytopenia Mother        itp  . Heart disease Mother   . Hypertension Mother   . Seizures Brother   . ADD / ADHD Brother   . Colon cancer Paternal Grandfather        in 15s  . Diabetes Maternal Grandmother   . Stroke Maternal Grandmother   . Heart disease Maternal Grandfather   . Stroke Maternal Grandfather   . Hypothyroidism Paternal Grandmother   . Psoriasis Brother   . Asthma Daughter   . Kidney disease Maternal Uncle   . Non-Hodgkin's lymphoma Paternal Uncle   . Leukemia Paternal Great-grandmother   . Ovarian cancer Maternal Great-grandmother   . Breast cancer Other        maternal great aunt  . Colon polyps Neg Hx     CURRENT MEDICATIONS:  Current Outpatient Medications  Medication Sig Dispense Refill  . dicyclomine (BENTYL) 10 MG capsule Take 1 capsule (10 mg total) by mouth 4 (four) times daily -  before meals and at bedtime. 60 capsule 1  . fexofenadine (ALLEGRA) 180 MG tablet Take 180 mg by mouth daily as needed for allergies.     . Multiple Vitamins-Minerals (MULTIVITAMIN ADULT PO) Take 1 tablet by mouth daily.     . pantoprazole (PROTONIX) 40 MG tablet Take 1 tablet (40 mg total) by mouth 2 (two) times daily before a meal. 60 tablet 4  . riTUXimab in sodium chloride 0.9 % 250 mL Inject into the vein once a week. Weekly x 4 cycles    . spironolactone  (ALDACTONE) 25 MG tablet Take 25 mg by mouth daily.    Marland Kitchen SYNTHROID 100 MCG tablet Take 100 mcg by mouth every morning.    . prochlorperazine (COMPAZINE) 10 MG tablet Take 1 tablet (10 mg total) by mouth every 6 (six) hours as needed for nausea or vomiting. (Patient not taking: Reported on 01/25/2020) 30 tablet 2   No current facility-administered medications for this visit.    ALLERGIES:  Allergies  Allergen Reactions  . Latex Rash    PHYSICAL EXAM:  Performance status (ECOG): 0 - Asymptomatic  Vitals:   01/25/20 0807  BP: 119/77  Pulse: 66  Resp: 18  Temp: 97.7 F (36.5 C)  SpO2: 100%   Wt Readings from Last 3 Encounters:  01/25/20 89.7 kg (197 lb 11.2 oz)  11/25/19 91 kg (200 lb 9.6 oz)  11/18/19 91 kg (200 lb 9.6 oz)   Physical Exam Vitals reviewed.  Constitutional:      Appearance: Normal appearance. She is obese.  Cardiovascular:     Rate and Rhythm: Normal rate and regular rhythm.     Pulses: Normal pulses.     Heart sounds: Normal heart sounds.  Pulmonary:     Effort: Pulmonary effort is normal.     Breath sounds: Normal breath sounds.  Abdominal:     Palpations: Abdomen is soft. There is no mass.     Tenderness: There is no abdominal tenderness.  Lymphadenopathy:     Cervical: No cervical adenopathy.     Upper Body:     Right upper body: No supraclavicular or axillary adenopathy.     Left upper body: No supraclavicular or axillary adenopathy.     Lower Body: No right inguinal adenopathy. No left inguinal adenopathy.  Neurological:     General: No focal deficit present.     Mental Status: She is alert and oriented to person, place, and time.  Psychiatric:        Mood and Affect: Mood normal.        Behavior: Behavior normal.     LABORATORY DATA:  I have reviewed the labs as listed.  CBC Latest Ref Rng & Units 01/24/2020 11/25/2019 11/18/2019  WBC 4.0 - 10.5 K/uL 5.2 6.0 5.9  Hemoglobin 12.0 - 15.0 g/dL 12.3 12.8 13.0  Hematocrit 36 - 46 % 37.7 38.9 38.9   Platelets 150 - 400 K/uL 197 192 200   CMP Latest Ref Rng & Units 01/24/2020 11/25/2019 11/18/2019  Glucose 70 - 99 mg/dL 94 98 131(H)  BUN 6 - 20 mg/dL 9 12 12   Creatinine 0.44 - 1.00 mg/dL 0.64 0.65 0.67  Sodium 135 - 145 mmol/L 139 139 137  Potassium 3.5 - 5.1 mmol/L 4.3 4.3 4.0  Chloride 98 - 111 mmol/L 108 102 105  CO2 22 - 32 mmol/L 24 25 24   Calcium 8.9 - 10.3 mg/dL 8.8(L) 9.6 8.9  Total Protein 6.5 - 8.1 g/dL 7.0 7.6 7.0  Total Bilirubin 0.3 - 1.2 mg/dL 1.2 1.0 1.0  Alkaline Phos 38 - 126 U/L 62 71 64  AST 15 - 41 U/L 18 18 21   ALT 0 - 44 U/L 25 18 18       Component Value Date/Time   RBC 4.39 01/24/2020 0814   MCV 85.9 01/24/2020 0814   MCV 82 12/04/2017 1012   MCH 28.0 01/24/2020 0814   MCHC 32.6 01/24/2020 0814   RDW 12.8 01/24/2020 0814   RDW 13.3 12/04/2017 1012   LYMPHSABS 1.7 01/24/2020 0814   LYMPHSABS 2.3 12/04/2017 1012   MONOABS 0.5 01/24/2020 0814   EOSABS 0.1 01/24/2020 0814   EOSABS 0.2 12/04/2017 1012   BASOSABS 0.1 01/24/2020 0814   BASOSABS 0.0 12/04/2017 1012    DIAGNOSTIC IMAGING:  I have independently reviewed the scans and discussed with the patient.   ASSESSMENT:  1.  Stage I AE duodenal follicular lymphoma: -EGD on 07/13/2019 shows patchy areas of whitish, nodular mucosa in second part of the duodenum, biopsy consistent with grade 5-4/5 follicular lymphoma. -PET scan on 07/25/2019-very mild hypermetabolic along the second portion of the duodenum, maximum SUV 3.6. -No B symptoms.  She lost some weight due to digestive problems. -Bone marrow biopsy on 08/02/2019 shows normocellular marrow with trilineage hematopoiesis. -She has acid reflux symptoms which improved mildly after starting gluten-free diet.  Continues to have nausea and diarrhea. -Weekly rituximab from 10/27/2019 through 11/18/2019.  2.  Hashimoto's thyroiditis: -She is on Synthroid.  3.  Family history: -Paternal uncle had DLBCL.  Paternal grandmother had leukemia.  Paternal  grandfather had colon cancer. -Maternal great aunt had breast cancer.  Maternal great grandmother had ovarian cancer.  Mother had ITP.    PLAN:  1.  Stage I AE duodenal follicular lymphoma: -She has completed weekly rituximab. -I have recommended EGD and possible biopsy in 3 to 6 months from end of April to evaluate response. -We will consider scans if required after 6 months. -We will plan to evaluate in 3 months.  2.  Watery diarrhea: -She reportedly developed watery diarrhea since 1 June.  This was associated with cramping in the right side of the abdomen. -She has been using Bentyl twice daily.  She cannot take more frequently as it causes nausea.  Diarrhea improved in the first 1 week.  It has come back in the last few days.  She now has one episode of watery diarrhea daily. -I have recommended follow-up with GI for stool studies.  Orders placed this encounter:  No orders of the defined types were placed in this encounter.    Derek Jack, MD [place] [phone]   I, Milinda Antis, am acting as a scribe for Dr. Sanda Linger.  I, Derek Jack MD, have reviewed the above documentation for accuracy and completeness, and I agree with the above.

## 2020-01-25 NOTE — Patient Instructions (Signed)
[place] at [facility] Discharge Instructions  You were seen today by Dr. Delton Coombes. He went over your recent results. Follow up with your gastroenterologist to do a stool sample for your diarrhea. Dr. Delton Coombes will see you back in 3 months for labs and follow up.   Thank you for choosing [place] at [facility] to provide your oncology and hematology care.  To afford each patient quality time with our provider, please arrive at least 15 minutes before your scheduled appointment time.   If you have a lab appointment with the [facility] please come in thru the Main Entrance and check in at the main information desk  You need to re-schedule your appointment should you arrive 10 or more minutes late.  We strive to give you quality time with our providers, and arriving late affects you and other patients whose appointments are after yours.  Also, if you no show three or more times for appointments you may be dismissed from the clinic at the providers discretion.     Again, thank you for choosing [facility].  Our hope is that these requests will decrease the amount of time that you wait before being seen by our physicians.       _____________________________________________________________  Should you have questions after your visit to [facility], please contact our office at [phone] between the hours of 8:00 a.m. and 4:30 p.m.  Voicemails left after 4:00 p.m. will not be returned until the following business day.  For prescription refill requests, have your pharmacy contact our office and allow 72 hours.    Cancer Center Support Programs:   > Cancer Support Group  2nd Tuesday of the month 1pm-2pm, Journey Room

## 2020-01-25 NOTE — Telephone Encounter (Signed)
Pt called verified name and dob Pt stated medication for diarrhea  is not working. offered pt an appt pt declined and stated she wanted to do the stool studies first

## 2020-01-26 ENCOUNTER — Telehealth: Payer: Self-pay | Admitting: Internal Medicine

## 2020-01-26 NOTE — Telephone Encounter (Signed)
Spoke with pt. She is going to call the lab to discuss if her stool sample will be acceptable for testing.

## 2020-01-26 NOTE — Telephone Encounter (Signed)
Please call patient. She has questions about her stools. [phone]

## 2020-01-27 ENCOUNTER — Encounter (HOSPITAL_COMMUNITY): Payer: Self-pay | Admitting: *Deleted

## 2020-01-27 DIAGNOSIS — R197 Diarrhea, unspecified: Secondary | ICD-10-CM | POA: Diagnosis not present

## 2020-01-29 LAB — CLOSTRIDIUM DIFFICILE BY PCR: Toxigenic C. Difficile by PCR: NEGATIVE

## 2020-01-30 ENCOUNTER — Other Ambulatory Visit: Payer: Self-pay | Admitting: Gastroenterology

## 2020-01-30 LAB — GI PROFILE, STOOL, PCR
Adenovirus F 40/41: NOT DETECTED
Astrovirus: NOT DETECTED
C difficile toxin A/B: NOT DETECTED
Campylobacter: NOT DETECTED
Cryptosporidium: NOT DETECTED
Cyclospora cayetanensis: DETECTED — AB
Entamoeba histolytica: NOT DETECTED
Enteroaggregative E coli: NOT DETECTED
Enteropathogenic E coli: NOT DETECTED
Enterotoxigenic E coli: NOT DETECTED
Giardia lamblia: NOT DETECTED
Norovirus GI/GII: NOT DETECTED
Plesiomonas shigelloides: NOT DETECTED
Rotavirus A: NOT DETECTED
Salmonella: NOT DETECTED
Sapovirus: NOT DETECTED
Shiga-toxin-producing E coli: NOT DETECTED
Shigella/Enteroinvasive E coli: NOT DETECTED
Vibrio cholerae: NOT DETECTED
Vibrio: NOT DETECTED
Yersinia enterocolitica: NOT DETECTED

## 2020-01-30 MED ORDER — SULFAMETHOXAZOLE-TRIMETHOPRIM 800-160 MG PO TABS
1.0000 | ORAL_TABLET | Freq: Two times a day (BID) | ORAL | 0 refills | Status: DC
Start: 2020-01-30 — End: 2020-04-27

## 2020-01-30 NOTE — Progress Notes (Signed)
Bactrim DS 1 po BID for 7 days sent to pharmacy due to + GI pathogen panel. See result note.

## 2020-02-05 NOTE — Progress Notes (Signed)
Referring Provider: Dettinger, Fransisca Kaufmann, MD Primary Care Physician:  Dettinger, Fransisca Kaufmann, MD Primary GI Physician: Dr. Gala Romney  Chief Complaint  Patient presents with  . Diarrhea    better    HPI:   Betty Hayes is a 34 y.o. female presenting today with a history of hypothyroidism secondary to Hashimoto's who presents today for follow-up of dysphagia and GERD. Prior evaluation for dysphagia with BPE on 04/04/2019 with moderate GERD.No evidence of stricture, mass, ulceration, or hiatal hernia. Barium tablet passed without delay into the stomach. She had been advised to see SLP for MBSS due to report of difficulty getting food to the back of her throat, but patient cancelled this. Thyroid evaluation with endocrine unrevealing other than as solitary 1.3 cm nodule/pseudonodule within the inferior pole of the left lobe of the thyroid. Endocrine had patient complete short course of steroids for thyroid inflammation which she reported helped her swallowing somewhat. Laryngoscopy with ENT on 04/25/19 without masses.  Noted one small area of lingual tonsil tissue in the midline base of the tongue that was slightly more prominent and protrudes mildly over the epiglottis with unknown clinical significance and doubted contribution to significant dysphagia.  CT neck with contrast on 05/10/2019 normal. EGD on 07/13/2019 with normal esophagus dilated and biopsied, normal stomach, patchy areas of whitish, nodular mucosa in the second portion of the duodenum s/p biopsied.  Duodenal biopsy revealed low-grade follicular lymphoma.  Esophageal biopsies benign without increased intraepithelial eosinophils. 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   Reviewed Dr. Fransico Michael last note from 01/25/20. He recommended EGD and possible biopsy in 3 to 6 months from end of April to evaluate response.   Last seen via telephone visit 08/17/19.  She  continued with dysphagia. No significant improvement with dilation. States she is paranoid on not being able to swallow. Feels her tongue isn't getting the food to the back of her throat appropriately. No trouble swallowing bread. Never feels food getting hung in her esophagus. Easier to swallow when head is tilted up rather than down. Reports 10 lbs weight loss in about 4 months. Thinks this is related to her decrease intake. Regarding GERD, symptoms were worse with breads and pasta. She didn't notice any significant improvement with Dexilant. Resumed Protonix once daily in the morning and levothyroxine at night. Felt Protonix in the morning helped more than anything but continued with mild symptoms in the evening. Plan included, MBSS, celiac seriologies, increase Protonix to BID (Protonix first thing in the morning and at 5pm), GERD diet, and follow-up in 6 months.   MBSS: Not completed.  Celiac Serologies: Negative.   Telephone call 01/09/20: diarrhea x 2 weeks with abdominal cramping/mild nausea. Pt has 2-3 BM daily. Pt reports no vomiting but feels she may want to at times. Abdominal cramping happens when pt may need to have a BM, after eating or 2 hours after eating. Pt follows a gluten free diet and tries to avoid dairy as much as possible. Recommend Cdiff and GI pathogen panel. Nurse was going to order stool testing but patient states another provider had prescribed Bentyl and she did not want to have stool testing completed unless Bentyl didn't help.   Stool studies completed 01/27/20 which revealed Cyclospora cayetanensis. Rx for Bactrim DS BID x 7 days sent to pharmacy.   Today:   Diarrhea: Diarrhea has been present for 1.5 months.  It has now resolved and her last dose  of Bactrim is today.  Reports she had intense abdominal pain with diarrhea but this has also resolved.  Currently with 1-2 soft formed BMs daily.  Denies bright red blood per rectum or melena.  Dysphagia: Not much of an issue any  more.  Symptoms seem to have improved on their own.  Had some trouble with popcorn recently, but otherwise, no significant symptoms.   GERD: Doing better. Following a gluten free diet. Went on a cleanse in March and symptoms resolved completely. When cancer treatment started, she had return of GERD symptoms. After cancer treatment, symptoms improved, and she is taking Protonix as needed, maybe twice a week. States GERD is very mild.  Nutella is also a trigger. No fried/fatty foods. Occasionally eats spicy foods, but doesn't notice any worsening of GERD symptoms. No soda. No carbonated beverages.   Mild nausea at times.  Does not need Zofran.  This started with cancer treatment in April.  Initially felt it was secondary to her cancer treatment but it did not resolve after cancer treatment was stopped.  Then she thought it was secondary to her diarrhea but has not noticed any significant change since diarrhea resolution.  Symptoms occur a couple times a week states it really is not enough to keep track of.  Not specifically after meals.  No associated abdominal pain.  No vomiting.  She does report getting full very quickly which is new since cancer treatment.  States she is eating a very small amount every couple of hours due to getting full quickly.  She has lost about 12 pounds in the last 6 months.  Patient states due to all the changes in her body, she has not been eating very much.  Initially decreased intake due to dysphagia, then decrease intake related to chemo, then decreased intake related to diarrhea.  Per patient, Dr. Delton Coombes recommended EGD in 6 months after treatment due to needing to get diarrhea evaluated.  Had a mole removed today from her left shoulder.   Reports her thyroid has changed since cancer treatment. States it was a little suppressed. Plans for additional blood work next month.    No NSAIDs.  No alcohol.   Past Medical History:  Diagnosis Date  . Allergy   . Cancer  ([place]) 02/6760   follicular lymphoma  . Dysphagia   . GERD (gastroesophageal reflux disease)   . Thyroid disease    Hypothyroidism  . Thyroid nodule 2020    Past Surgical History:  Procedure Laterality Date  . ABDOMINAL HYSTERECTOMY    . ESOPHAGOGASTRODUODENOSCOPY N/A 07/13/2019   Procedure: ESOPHAGOGASTRODUODENOSCOPY (EGD);  Surgeon: Daneil Dolin, MD; normal esophagus s/p dilation and biopsied, normal stomach, patchy areas of whitish, nodular mucosa in the second portion of the duodenum s/p biopsy.  Duodenal biopsy with low-grade follicular lymphoma.  Esophageal biopsies benign without increased intraepithelial eosinophils.  Marland Kitchen KIDNEY STONE SURGERY    . MALONEY DILATION N/A 07/13/2019   Procedure: Venia Minks DILATION;  Surgeon: Daneil Dolin, MD;  Location: [facility];  Service: Endoscopy;  Laterality: N/A;  . TONSILLECTOMY     Around age 58  . WISDOM TOOTH EXTRACTION      Current Outpatient Medications  Medication Sig Dispense Refill  . fexofenadine (ALLEGRA) 180 MG tablet Take 180 mg by mouth daily as needed for allergies.     . Multiple Vitamins-Minerals (MULTIVITAMIN ADULT PO) Take 1 tablet by mouth daily.     . pantoprazole (PROTONIX) 40 MG tablet Take 1 tablet (40  mg total) by mouth 2 (two) times daily before a meal. (Patient taking differently: Take 40 mg by mouth 2 (two) times daily before a meal. As needed.) 60 tablet 4  . prochlorperazine (COMPAZINE) 10 MG tablet Take 1 tablet (10 mg total) by mouth every 6 (six) hours as needed for nausea or vomiting. 30 tablet 2  . spironolactone (ALDACTONE) 25 MG tablet Take 25 mg by mouth daily.    Marland Kitchen sulfamethoxazole-trimethoprim (BACTRIM DS) 800-160 MG tablet Take 1 tablet by mouth 2 (two) times daily. 14 tablet 0  . SYNTHROID 100 MCG tablet Take 100 mcg by mouth every morning.     No current facility-administered medications for this visit.    Allergies as of 02/06/2020 - Review Complete 02/06/2020  Allergen Reaction Noted  .  Latex Rash 01/03/2016    Family History  Problem Relation Age of Onset  . Thrombocytopenia Mother        itp  . Heart disease Mother   . Hypertension Mother   . Seizures Brother   . ADD / ADHD Brother   . Colon cancer Paternal Grandfather        in 84s  . Diabetes Maternal Grandmother   . Stroke Maternal Grandmother   . Heart disease Maternal Grandfather   . Stroke Maternal Grandfather   . Hypothyroidism Paternal Grandmother   . Psoriasis Brother   . Asthma Daughter   . Kidney disease Maternal Uncle   . Non-Hodgkin's lymphoma Paternal Uncle   . Leukemia Paternal Great-grandmother   . Ovarian cancer Maternal Great-grandmother   . Breast cancer Other        maternal great aunt  . Colon polyps Neg Hx     Social History   Socioeconomic History  . Marital status: Married    Spouse name: Not on file  . Number of children: 2  . Years of education: Not on file  . Highest education level: Not on file  Occupational History    Employer: [employer]  Tobacco Use  . Smoking status: Never Smoker  . Smokeless tobacco: Never Used  Vaping Use  . Vaping Use: Never used  Substance and Sexual Activity  . Alcohol use: Not Currently    Comment: occasional- maybe once a month  . Drug use: No  . Sexual activity: Yes    Birth control/protection: Surgical    Comment: married 14 years  Other Topics Concern  . Not on file  Social History Narrative  . Not on file   Social Determinants of Health   Financial Resource Strain: Low Risk   . Difficulty of Paying Living Expenses: Not hard at all  Food Insecurity: No Food Insecurity  . Worried About Charity fundraiser in the Last Year: Never true  . Ran Out of Food in the Last Year: Never true  Transportation Needs: No Transportation Needs  . Lack of Transportation (Medical): No  . Lack of Transportation (Non-Medical): No  Physical Activity: Insufficiently Active  . Days of Exercise per Week: 5 days  . Minutes of Exercise per  Session: 20 min  Stress: Stress Concern Present  . Feeling of Stress : To some extent  Social Connections: Moderately Isolated  . Frequency of Communication with Friends and Family: More than three times a week  . Frequency of Social Gatherings with Friends and Family: More than three times a week  . Attends Religious Services: Never  . Active Member of Clubs or Organizations: No  . Attends Archivist  Meetings: Never  . Marital Status: Married    Review of Systems: Gen: Denies fever, chills, cold or flulike symptoms, lightheadedness, dizziness, presyncope, syncope. CV: Denies chest pain or palpitations. Resp: Denies dyspnea or cough. GI: See HPI Heme: See HPI  Physical Exam: BP 120/79   Pulse 89   Temp 98.7 F (37.1 C) (Oral)   Ht 5' 7"  (1.702 m)   Wt 197 lb (89.4 kg)   BMI 30.85 kg/m  General:   Alert and oriented. No distress noted. Pleasant and cooperative.  Head:  Normocephalic and atraumatic. Eyes:  Conjuctiva clear without scleral icterus. Heart:  S1, S2 present without murmurs appreciated. Lungs:  Clear to auscultation bilaterally. No wheezes, rales, or rhonchi. No distress.  Abdomen:  +BS, soft, non-tender and non-distended. No rebound or guarding. No HSM or masses noted. Msk:  Symmetrical without gross deformities. Normal posture. Extremities:  Without edema. Neurologic:  Alert and  oriented x4 Psych: Normal mood and affect.

## 2020-02-06 ENCOUNTER — Other Ambulatory Visit (HOSPITAL_COMMUNITY)
Admission: RE | Admit: 2020-02-06 | Discharge: 2020-02-06 | Disposition: A | Discharge status: Home / Self Care | Payer: BC Managed Care – PPO | Source: Ambulatory Visit | Attending: Family Medicine | Admitting: Family Medicine

## 2020-02-06 ENCOUNTER — Encounter: Payer: Self-pay | Admitting: Gastroenterology

## 2020-02-06 ENCOUNTER — Ambulatory Visit (INDEPENDENT_AMBULATORY_CARE_PROVIDER_SITE_OTHER): Payer: BC Managed Care – PPO | Admitting: Family Medicine

## 2020-02-06 ENCOUNTER — Ambulatory Visit (INDEPENDENT_AMBULATORY_CARE_PROVIDER_SITE_OTHER): Payer: BC Managed Care – PPO | Admitting: Gastroenterology

## 2020-02-06 ENCOUNTER — Other Ambulatory Visit: Payer: Self-pay

## 2020-02-06 ENCOUNTER — Telehealth: Payer: Self-pay | Admitting: *Deleted

## 2020-02-06 ENCOUNTER — Encounter: Payer: Self-pay | Admitting: Family Medicine

## 2020-02-06 VITALS — BP 120/79 | HR 75 | Temp 98.2°F | Ht 67.0 in | Wt 195.0 lb

## 2020-02-06 VITALS — BP 120/79 | HR 89 | Temp 98.7°F | Ht 67.0 in | Wt 197.0 lb

## 2020-02-06 DIAGNOSIS — A09 Infectious gastroenteritis and colitis, unspecified: Secondary | ICD-10-CM

## 2020-02-06 DIAGNOSIS — R1319 Other dysphagia: Secondary | ICD-10-CM

## 2020-02-06 DIAGNOSIS — L819 Disorder of pigmentation, unspecified: Secondary | ICD-10-CM | POA: Diagnosis not present

## 2020-02-06 DIAGNOSIS — C8209 Follicular lymphoma grade I, extranodal and solid organ sites: Secondary | ICD-10-CM

## 2020-02-06 DIAGNOSIS — R110 Nausea: Secondary | ICD-10-CM

## 2020-02-06 DIAGNOSIS — D485 Neoplasm of uncertain behavior of skin: Secondary | ICD-10-CM

## 2020-02-06 DIAGNOSIS — R6881 Early satiety: Secondary | ICD-10-CM | POA: Diagnosis not present

## 2020-02-06 DIAGNOSIS — R197 Diarrhea, unspecified: Secondary | ICD-10-CM | POA: Insufficient documentation

## 2020-02-06 DIAGNOSIS — L820 Inflamed seborrheic keratosis: Secondary | ICD-10-CM | POA: Diagnosis not present

## 2020-02-06 DIAGNOSIS — K219 Gastro-esophageal reflux disease without esophagitis: Secondary | ICD-10-CM

## 2020-02-06 NOTE — Progress Notes (Signed)
BP 120/79   Pulse 75   Temp 98.2 F (36.8 C)   Ht 5' 7"  (1.702 m)   Wt 195 lb (88.5 kg)   SpO2 96%   BMI 30.54 kg/m    Subjective:   Patient ID: Betty Hayes, female    DOB: 02-26-1986, 34 y.o.   MRN: [id number]  HPI: Betty Hayes is a 34 y.o. female presenting on 02/06/2020 for Mole Removal (left shoulder. Itching, Denies pain. Enlarging)   HPI Patient is coming in today for a mole removal on her left shoulder she says is been there for years but is really grown over the past 6 months and become more concerned, she was told to come in by her oncologist gynecologist to get it removed.  She says been itchy and sometimes irritated when she rubs against it in a couple times in his, for fallen off.  Relevant past medical, surgical, family and social history reviewed and updated as indicated. Interim medical history since our last visit reviewed. Allergies and medications reviewed and updated.  Review of Systems  Constitutional: Negative for chills and fever.  Respiratory: Negative for chest tightness and shortness of breath.   Cardiovascular: Negative for chest pain and leg swelling.  Musculoskeletal: Negative for back pain and gait problem.  Skin: Positive for rash.  Psychiatric/Behavioral: Negative for agitation and behavioral problems.  All other systems reviewed and are negative.   Per HPI unless specifically indicated above   Allergies as of 02/06/2020      Reactions   Latex Rash      Medication List       Accurate as of February 06, 2020  9:09 AM. If you have any questions, ask your nurse or doctor.        STOP taking these medications   dicyclomine 10 MG capsule Commonly known as: Bentyl Stopped by: Fransisca Kaufmann Dettinger, MD   riTUXimab in sodium chloride 0.9 % 250 mL Stopped by: Fransisca Kaufmann Dettinger, MD     TAKE these medications   fexofenadine 180 MG tablet Commonly known as: ALLEGRA Take 180 mg by mouth daily as needed for allergies.   MULTIVITAMIN ADULT PO  Take 1 tablet by mouth daily.   pantoprazole 40 MG tablet Commonly known as: Protonix Take 1 tablet (40 mg total) by mouth 2 (two) times daily before a meal.   prochlorperazine 10 MG tablet Commonly known as: COMPAZINE Take 1 tablet (10 mg total) by mouth every 6 (six) hours as needed for nausea or vomiting.   spironolactone 25 MG tablet Commonly known as: ALDACTONE Take 25 mg by mouth daily.   sulfamethoxazole-trimethoprim 800-160 MG tablet Commonly known as: BACTRIM DS Take 1 tablet by mouth 2 (two) times daily.   Synthroid 100 MCG tablet Generic drug: levothyroxine Take 100 mcg by mouth every morning.        Objective:   BP 120/79   Pulse 75   Temp 98.2 F (36.8 C)   Ht 5' 7"  (1.702 m)   Wt 195 lb (88.5 kg)   SpO2 96%   BMI 30.54 kg/m   Wt Readings from Last 3 Encounters:  02/06/20 195 lb (88.5 kg)  01/25/20 197 lb 11.2 oz (89.7 kg)  11/25/19 200 lb 9.6 oz (91 kg)    Physical Exam Vitals and nursing note reviewed.  Constitutional:      General: She is not in acute distress.    Appearance: She is well-developed. She is not diaphoretic.  Eyes:  Conjunctiva/sclera: Conjunctivae normal.  Skin:    General: Skin is warm and dry.     Findings: Lesion (Patient has a skin lesion on her left posterior upper arm has a verrucous irregularity on top is about 0.2 cm in diameter has normal borders and is pigmented is mostly one solid color.) present. No rash.  Neurological:     Mental Status: She is alert and oriented to person, place, and time.     Coordination: Coordination normal.  Psychiatric:        Behavior: Behavior normal.     Skin lesion removal: Verbal consent was obtained.  Betadine was used for cleansing.  2cc of 2% lidocaine without epinephrine was used for anesthesia.  Shave biopsy was performed with good margins.  Silver nitrate was used to achieve hemostasis.  Topical antibiotic was used and then it was covered by 3x 3 and bandage told in place.  Procedure was tolerated well.  Bleeding was minimal   Assessment & Plan:   Problem List Items Addressed This Visit    None    Visit Diagnoses    Pigmented skin lesion    -  Primary   Relevant Orders   Surgical pathology      Will send for pathology but with verrucous in nature, likely seborrheic keratosis irritated or inflamed. Follow up plan: Return if symptoms worsen or fail to improve.  Counseling provided for all of the vaccine components No orders of the defined types were placed in this encounter.   Caryl Pina, MD [facility] 02/06/2020, 9:09 AM

## 2020-02-06 NOTE — Assessment & Plan Note (Signed)
S/p extensive evaluation with BPE on 04/04/2019 with moderate GERD.No evidence of stricture, mass, ulceration, or hiatal hernia. Barium tablet passed without delay into the stomach. Thyroid evaluation with endocrine unrevealing other than as solitary 1.3 cm nodule/pseudonodule within the inferior pole of the left lobe of the thyroid. Endocrine had patient complete short course of steroids for thyroid inflammation which she reported helped her swallowing somewhat. Laryngoscopy with ENT on 04/25/19 without masses.  Noted one small area of lingual tonsil tissue in the midline base of the tongue that was slightly more prominent and protrudes mildly over the epiglottis with unknown clinical significance and doubted contribution to significant dysphagia.  CT neck with contrast on 05/10/2019 normal. EGD on 07/13/2019 with normal esophagus dilated and biopsied, normal stomach, patchy areas of whitish, nodular mucosa in the second portion of the duodenum s/p biopsied.  Duodenal biopsy revealed low-grade follicular lymphoma.  Esophageal biopsies benign without increased intraepithelial eosinophils.  She had been referred to speech therapy for MBSS due to report of difficulty getting food to the back of her throat rather than any ongoing esophageal dysphagia symptoms.  Patient never had MBSS and reports symptoms seem to have now resolved on their own.  She did have some trouble with popcorn recently, but otherwise no significant symptoms.    She will continue to monitor for now and let us know of any worsening symptoms.

## 2020-02-06 NOTE — Assessment & Plan Note (Addendum)
History of Stage 1 AE duodenal follicular lymphoma diagnosed 07/13/2019 s/p treatment with weekly rituximab from 10/27/2019 through 11/18/2019.  Per review of Dr. Tomie China last note, the patient needs repeat EGD and possible biopsy 3-6 months from end of April to evaluate response.  Per patient, Dr. Delton Coombes recommended 68-monthrepeat due to need diarrhea evaluated; however, diarrhea has now resolved (see below).  Suspect we may be able to go ahead and proceed with EGD in the near future.  I will touch base with Dr. KDelton Coombesto confirm timing.  Further recommendations to follow.

## 2020-02-06 NOTE — Patient Instructions (Addendum)
We can hold off on resuming Protonix on a daily basis for now while you try your cleanse again to see if this improves/resolves your occasional reflux/nausea symptoms.   Follow a GERD diet:  Avoid fried, fatty, greasy, spicy, citrus foods. Avoid caffeine and carbonated beverages. Avoid chocolate. Try eating 4-6 small meals a day rather than 3 large meals. Do not eat within 3 hours of laying down. Prop head of bed up on wood or bricks to create a 6 inch incline.  As we discussed, your symptoms do seem somewhat consistent with delayed gastric emptying. We will plan for an EGD in the near future after I discuss with Dr. Delton Coombes. Keep in mind while you are doing your cleanse that a lot of fiber may slow gastric emptying even more. If you notice worsening nausea/early satiety, decrease the amount of fiber you are consuming in the way of raw vegetables and fruit.   Monitor for any return of diarrhea.   We will plan to see you back after your upper endoscopy. Again, after I hear back from Dr. Delton Coombes, our scheduling team will reach out to you to discuss scheduling.   Aliene Altes, PA-C [facility]

## 2020-02-06 NOTE — Telephone Encounter (Signed)
Pt consented to a telephone visit today.

## 2020-02-06 NOTE — Telephone Encounter (Signed)
Kirk Cheema, you are scheduled for a virtual visit with your provider today.  Just as we do with appointments in the office, we must obtain your consent to participate.  Your consent will be active for this visit and any virtual visit you may have with one of our providers in the next 365 days.  If you have a MyChart account, I can also send a copy of this consent to you electronically.  All virtual visits are billed to your insurance company just like a traditional visit in the office.  As this is a virtual visit, video technology does not allow for your provider to perform a traditional examination.  This may limit your provider's ability to fully assess your condition.  If your provider identifies any concerns that need to be evaluated in person or the need to arrange testing such as labs, EKG, etc, we will make arrangements to do so.  Although advances in technology are sophisticated, we cannot ensure that it will always work on either your end or our end.  If the connection with a video visit is poor, we may have to switch to a telephone visit.  With either a video or telephone visit, we are not always able to ensure that we have a secure connection.   I need to obtain your verbal consent now.   Are you willing to proceed with your visit today?

## 2020-02-06 NOTE — Assessment & Plan Note (Signed)
Addressed under nausea without vomiting.

## 2020-02-06 NOTE — Assessment & Plan Note (Addendum)
Fairly well controlled on Protonix 40 mg as needed, about twice a week.  Avoiding dietary triggers including gluten, Nutella, fried/fatty foods, soda, and carbonated beverages.  EGD 07/13/2019 with normal esophagus s/p dilation and biopsy, normal stomach, patchy areas of whitish, nodular mucosa in the second portion of the duodenum s/p biopsy.  Duodenal biopsy with low-grade follicular lymphoma.  Esophageal biopsies benign.  Patient had resolution of GERD after completing a cleanse in March but intermittent symptoms returned after cancer treatment.  Also notes mild intermittent nausea without vomiting and early satiety since cancer treatment as discussed below.  Discussed possibly resuming Protonix 40 mg daily rather than as needed.  Patient prefers to hold off on this as she plans to complete another cleanse starting at the beginning of August.    Of note, patient underwent treatment for duodenal follicular lymphoma and Dr. Delton Coombes has recommended repeat EGD with possible biopsy and 3-6 months from end of April to evaluate response.  I will discuss with Dr. Delton Coombes if he would be okay with pursuing EGD now.  Plan: Continue taking Protonix 40 mg daily as needed. Requested she let us know if symptoms do not improve with her cleanse in August. Advise she continue to avoid known dietary triggers and follow a GERD diet/lifestyle.  She was counseled on this. I will reach out to Dr. Delton Coombes to determine timing EGD.  I suspect we will be able to move forward with EGD at this time and will likely follow-up thereafter. Advised patient to call with any questions or concerns prior to next visit.

## 2020-02-06 NOTE — Assessment & Plan Note (Signed)
Secondary to  Cyclospora cayetanensis s/p treatment with Bactrim DS twice daily x7 days with symptom resolution.  Last dose of Bactrim is today.  Currently with 1-2 soft formed BMs daily.  No alarm symptoms.  Advise she monitor for any return of diarrhea and let us know.

## 2020-02-06 NOTE — Assessment & Plan Note (Addendum)
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  She developed nausea during cancer treatment and she has continued with intermittent symptoms.  Also with new onset early satiety.  Per chart review, she has lost 12 pounds in the last 6 months.  She denies vomiting or associated abdominal pain.  GERD symptoms fairly well controlled on Protonix 40 mg as needed, about twice a week.  She was originally taking Protonix daily. After cleanse in March of this year, GERD symptoms completely resolved, but then returned during cancer treatment.  Query whether symptoms are actually related to ongoing silent GERD.  May also have developed gastroparesis.  Doubt new onset of pyloric stenosis or other gastric outlet obstruction, but cannot rule this out.  Per Dr. Tomie China last note, patient needed repeat EGD and possible biopsy 3-6 months after cancer treatment.  I will reach out to Dr. Delton Coombes to confirm timing, but I suspect we can likely go ahead and move forward with EGD at this time. Discussed resuming Protonix daily rather than as needed with patient.  She prefers to hold off on this for now as she is going to complete a cleanse starting in August and would like to see if this improves her symptoms.  Plan: Continue Protonix 40 mg daily as needed. Advise she let me know if her symptoms do not improve with her cleanse in August. Continue to avoid known reflux triggers.  Advise she continue to follow a GERD diet/lifestyle.  She was counseled on this. I will reach out to Dr. Delton Coombes regarding timing of EGD.  Further recommendations to follow. I suspect we will follow up in office after EGD.  Advised to call with questions or concerns prior.

## 2020-02-09 LAB — SURGICAL PATHOLOGY

## 2020-02-12 ENCOUNTER — Telehealth: Payer: Self-pay | Admitting: Gastroenterology

## 2020-02-12 NOTE — Telephone Encounter (Signed)
Betty Hayes, please let patient know Dr. Delton Coombes recommended holding off on EGD until October. We can plan to have EGD completed mid to later part of October and follow-up thereafter. The clinical pool will reach out to schedule her when the schedule is available. She should continue to monitor her nausea and early satiety and let me know if this doesn't improve with her cleanse she plans to complete in August.   [facility] clinical pool: Patient needs repeat EGD with Dr. Gala Romney in mid to later October 2021. ASA II.  Dx: Grade 1 duodenal follicular lymphoma; nausea, early satiety

## 2020-02-13 NOTE — Telephone Encounter (Signed)
Pt returned call. Pt is aware that [facility] spoke with Dr. Delton Coombes and pt will hold off on EGD until mid to lat October. Pt is aware that when the schedule is available, the clinical pool will call her to schedule apt. Pt will monitor nausea ect and if not improving with her cleanse, pt will call back.

## 2020-02-13 NOTE — Telephone Encounter (Signed)
Lmom, waiting on a return call.  

## 2020-02-15 ENCOUNTER — Ambulatory Visit: Payer: BC Managed Care – PPO | Admitting: Gastroenterology

## 2020-03-08 ENCOUNTER — Telehealth: Payer: Self-pay | Admitting: *Deleted

## 2020-03-08 NOTE — Telephone Encounter (Signed)
Called patient to schedule EGD with Dr. Gala Romney, scheduled for 05/09/20 at 7:30am. COVID test scheduled for 10/18 at 3:30pm. Aware will mail instructions to her.

## 2020-03-13 NOTE — Telephone Encounter (Signed)
A Pre-Authorization is not Required from [employer]

## 2020-04-12 ENCOUNTER — Other Ambulatory Visit: Payer: Self-pay

## 2020-04-12 DIAGNOSIS — E038 Other specified hypothyroidism: Secondary | ICD-10-CM | POA: Diagnosis not present

## 2020-04-12 DIAGNOSIS — E063 Autoimmune thyroiditis: Secondary | ICD-10-CM | POA: Diagnosis not present

## 2020-04-16 LAB — T3, FREE: T3, Free: 3.2 pg/mL (ref 2.0–4.4)

## 2020-04-16 LAB — T4, FREE: Free T4: 1.81 ng/dL — ABNORMAL HIGH (ref 0.82–1.77)

## 2020-04-16 LAB — TSH: TSH: 0.237 u[IU]/mL — ABNORMAL LOW (ref 0.450–4.500)

## 2020-04-16 LAB — T3, REVERSE: Reverse T3, Serum: 23.5 ng/dL (ref 9.2–24.1)

## 2020-04-18 ENCOUNTER — Telehealth: Payer: Self-pay

## 2020-04-18 NOTE — Telephone Encounter (Signed)
Contacted patient and advised that it was time for a recheck and scan of her thyroid. She stated that she currently has an oncologist and is getting ready to have a PET scan and she also has an endocrinologist that she would like to make a decision on this . Advised patient I would attempt to forward message to [facility] with endo and patient verbalized understanding.

## 2020-04-18 NOTE — Telephone Encounter (Signed)
Betty Hayes sent a reminder that patient is due for her thyroid US recheck. She saw her 1 year ago for an acute visit and you and patient have since discussed this thyroid issue. Will you order follow up US for nodule if she needs it? It appears she has other issues going on as well. Please advise

## 2020-04-18 NOTE — Telephone Encounter (Signed)
Patient needs a visit, its been quite a while since we have seen her for anything besides a mole removal.  We would also like to recheck her thyroid levels which has not been done in over a year.

## 2020-04-25 ENCOUNTER — Inpatient Hospital Stay (HOSPITAL_COMMUNITY): Payer: BC Managed Care – PPO

## 2020-05-02 ENCOUNTER — Ambulatory Visit (HOSPITAL_COMMUNITY): Payer: BC Managed Care – PPO | Admitting: Hematology

## 2020-05-07 ENCOUNTER — Other Ambulatory Visit (HOSPITAL_COMMUNITY)
Admission: RE | Admit: 2020-05-07 | Discharge: 2020-05-07 | Disposition: A | Discharge status: Home / Self Care | Payer: BC Managed Care – PPO | Source: Ambulatory Visit | Attending: Internal Medicine | Admitting: Internal Medicine

## 2020-05-07 ENCOUNTER — Other Ambulatory Visit: Payer: Self-pay

## 2020-05-07 DIAGNOSIS — Z01812 Encounter for preprocedural laboratory examination: Secondary | ICD-10-CM | POA: Insufficient documentation

## 2020-05-07 DIAGNOSIS — Z20822 Contact with and (suspected) exposure to covid-19: Secondary | ICD-10-CM | POA: Insufficient documentation

## 2020-05-08 LAB — SARS CORONAVIRUS 2 (TAT 6-24 HRS): SARS Coronavirus 2: NEGATIVE

## 2020-05-09 ENCOUNTER — Encounter (HOSPITAL_COMMUNITY): Payer: Self-pay | Admitting: Internal Medicine

## 2020-05-09 ENCOUNTER — Ambulatory Visit (HOSPITAL_COMMUNITY)
Admission: RE | Admit: 2020-05-09 | Discharge: 2020-05-09 | Disposition: A | Discharge status: Home / Self Care | Payer: BC Managed Care – PPO | Attending: Internal Medicine | Admitting: Internal Medicine

## 2020-05-09 ENCOUNTER — Other Ambulatory Visit: Payer: Self-pay

## 2020-05-09 ENCOUNTER — Encounter (HOSPITAL_COMMUNITY)
Admission: RE | Disposition: A | Discharge status: Home / Self Care | Payer: Self-pay | Source: Home / Self Care | Attending: Internal Medicine

## 2020-05-09 DIAGNOSIS — K2270 Barrett's esophagus without dysplasia: Secondary | ICD-10-CM | POA: Diagnosis not present

## 2020-05-09 DIAGNOSIS — C8229 Follicular lymphoma grade III, unspecified, extranodal and solid organ sites: Secondary | ICD-10-CM | POA: Diagnosis not present

## 2020-05-09 DIAGNOSIS — C8203 Follicular lymphoma grade I, intra-abdominal lymph nodes: Secondary | ICD-10-CM | POA: Diagnosis not present

## 2020-05-09 HISTORY — PX: BIOPSY: SHX5522

## 2020-05-09 HISTORY — PX: ESOPHAGOGASTRODUODENOSCOPY: SHX5428

## 2020-05-09 SURGERY — EGD (ESOPHAGOGASTRODUODENOSCOPY)
Anesthesia: Moderate Sedation

## 2020-05-09 MED ORDER — MIDAZOLAM HCL 5 MG/5ML IJ SOLN
INTRAMUSCULAR | Status: AC
  Filled 2020-05-09: qty 10

## 2020-05-09 MED ORDER — ONDANSETRON HCL 4 MG/2ML IJ SOLN
INTRAMUSCULAR | Status: AC
  Filled 2020-05-09: qty 2

## 2020-05-09 MED ORDER — ONDANSETRON HCL 4 MG/2ML IJ SOLN
INTRAMUSCULAR | Status: DC | PRN
  Administered 2020-05-09: 4 mg via INTRAVENOUS

## 2020-05-09 MED ORDER — MIDAZOLAM HCL 5 MG/5ML IJ SOLN
INTRAMUSCULAR | Status: DC | PRN
  Administered 2020-05-09 (×4): 2 mg via INTRAVENOUS

## 2020-05-09 MED ORDER — MEPERIDINE HCL 50 MG/ML IJ SOLN
INTRAMUSCULAR | Status: AC
  Filled 2020-05-09: qty 1

## 2020-05-09 MED ORDER — STERILE WATER FOR IRRIGATION IR SOLN
Status: DC | PRN
  Administered 2020-05-09: 2.5 mL

## 2020-05-09 MED ORDER — MEPERIDINE HCL 100 MG/ML IJ SOLN
INTRAMUSCULAR | Status: DC | PRN
  Administered 2020-05-09 (×2): 25 mg via INTRAVENOUS

## 2020-05-09 MED ORDER — SODIUM CHLORIDE 0.9 % IV SOLN: INTRAVENOUS | Status: DC

## 2020-05-09 MED ORDER — LIDOCAINE VISCOUS HCL 2 % MT SOLN
OROMUCOSAL | Status: AC
  Filled 2020-05-09: qty 15

## 2020-05-09 MED ORDER — LIDOCAINE VISCOUS HCL 2 % MT SOLN
OROMUCOSAL | Status: DC | PRN
  Administered 2020-05-09: 1 via OROMUCOSAL

## 2020-05-09 NOTE — H&P (Signed)
@LOGO @   Primary Care Physician:  Dettinger, Fransisca Kaufmann, MD Primary Gastroenterologist:  Dr. Gala Romney  Pre-Procedure History & Physical: HPI:  Betty Hayes is a 34 y.o. female here for surveillance EGD to reassess grade 1 follicular cell lymphoma of the duodenum.  Patient is dysphagia much improved since her esophagus was empirically dilated last year.  No increased epithelial lymphocytes on biopsy last year.  Past Medical History:  Diagnosis Date  . Allergy   . Cancer ([place]) 38/4536   follicular lymphoma  . Dysphagia   . GERD (gastroesophageal reflux disease)   . Thyroid disease    Hypothyroidism  . Thyroid nodule 2020    Past Surgical History:  Procedure Laterality Date  . ABDOMINAL HYSTERECTOMY    . ESOPHAGOGASTRODUODENOSCOPY N/A 07/13/2019   Procedure: ESOPHAGOGASTRODUODENOSCOPY (EGD);  Surgeon: Daneil Dolin, MD; normal esophagus s/p dilation and biopsied, normal stomach, patchy areas of whitish, nodular mucosa in the second portion of the duodenum s/p biopsy.  Duodenal biopsy with low-grade follicular lymphoma.  Esophageal biopsies benign without increased intraepithelial eosinophils.  Marland Kitchen KIDNEY STONE SURGERY    . MALONEY DILATION N/A 07/13/2019   Procedure: Venia Minks DILATION;  Surgeon: Daneil Dolin, MD;  Location: [facility]ITE;  Service: Endoscopy;  Laterality: N/A;  . TONSILLECTOMY     Around age 23  . WISDOM TOOTH EXTRACTION      Prior to Admission medications   Medication Sig Start Date End Date Taking? Authorizing Provider  Ascorbic Acid (VITAMIN C PO) Take 1 tablet by mouth daily.   Yes [provider]  Bacillus Coagulans-Inulin (PROBIOTIC-PREBIOTIC PO) Take by mouth.   Yes [provider]  Cholecalciferol (VITAMIN D3) 50 MCG (2000 UT) TABS Take 2,000 Units by mouth daily.   Yes [provider]  ECHINACEA PO Take 760 mg by mouth at bedtime.   Yes [provider]  fexofenadine (ALLEGRA) 180 MG tablet Take 180 mg by mouth daily as  needed for allergies.    Yes [provider]  liothyronine (CYTOMEL) 5 MCG tablet Take 5 mcg by mouth daily. 04/18/20  Yes [provider]  MAGNESIUM GLUCONATE PO Take 240 mg by mouth at bedtime. 120 mg   Yes [provider]  Multiple Vitamins-Minerals (MULTIVITAMIN ADULT PO) Take 1 tablet by mouth daily.    Yes [provider]  pantoprazole (PROTONIX) 40 MG tablet Take 1 tablet (40 mg total) by mouth 2 (two) times daily before a meal. Patient taking differently: Take 40 mg by mouth as needed (reflux).  04/06/19 04/27/20 Yes Harper, Kristen S, PA-C  PREBIOTIC PRODUCT PO Take 330 mg by mouth daily. enzymes   Yes [provider]  prochlorperazine (COMPAZINE) 10 MG tablet Take 1 tablet (10 mg total) by mouth every 6 (six) hours as needed for nausea or vomiting. 11/08/19  Yes Derek Jack, MD  Selenium 200 MCG CAPS Take 200 mcg by mouth at bedtime.   Yes [provider]  spironolactone (ALDACTONE) 25 MG tablet Take 25 mg by mouth daily. 01/11/20  Yes [provider]  SYNTHROID 88 MCG tablet Take 88 mcg by mouth every morning.  06/12/19  Yes [provider]  Zinc 23 MG LOZG Take 1 lozenge by mouth daily.   Yes [provider]    Allergies as of 03/08/2020 - Review Complete 02/06/2020  Allergen Reaction Noted  . Latex Rash 01/03/2016    Family History  Problem Relation Age of Onset  . Thrombocytopenia Mother  itp  . Heart disease Mother   . Hypertension Mother   . Seizures Brother   . ADD / ADHD Brother   . Colon cancer Paternal Grandfather        in 69s  . Diabetes Maternal Grandmother   . Stroke Maternal Grandmother   . Heart disease Maternal Grandfather   . Stroke Maternal Grandfather   . Hypothyroidism Paternal Grandmother   . Psoriasis Brother   . Asthma Daughter   . Kidney disease Maternal Uncle   . Non-Hodgkin's lymphoma Paternal Uncle   . Leukemia Paternal Great-grandmother   . Ovarian  cancer Maternal Great-grandmother   . Breast cancer Other        maternal great aunt  . Colon polyps Neg Hx     Social History   Socioeconomic History  . Marital status: Married    Spouse name: Not on file  . Number of children: 2  . Years of education: Not on file  . Highest education level: Not on file  Occupational History    Employer: [employer]  Tobacco Use  . Smoking status: Never Smoker  . Smokeless tobacco: Never Used  Vaping Use  . Vaping Use: Never used  Substance and Sexual Activity  . Alcohol use: Not Currently    Comment: occasional- maybe once a month  . Drug use: No  . Sexual activity: Yes    Birth control/protection: Surgical    Comment: married 14 years  Other Topics Concern  . Not on file  Social History Narrative  . Not on file   Social Determinants of Health   Financial Resource Strain: Low Risk   . Difficulty of Paying Living Expenses: Not hard at all  Food Insecurity: No Food Insecurity  . Worried About Charity fundraiser in the Last Year: Never true  . Ran Out of Food in the Last Year: Never true  Transportation Needs: No Transportation Needs  . Lack of Transportation (Medical): No  . Lack of Transportation (Non-Medical): No  Physical Activity: Insufficiently Active  . Days of Exercise per Week: 5 days  . Minutes of Exercise per Session: 20 min  Stress: Stress Concern Present  . Feeling of Stress : To some extent  Social Connections: Moderately Isolated  . Frequency of Communication with Friends and Family: More than three times a week  . Frequency of Social Gatherings with Friends and Family: More than three times a week  . Attends Religious Services: Never  . Active Member of Clubs or Organizations: No  . Attends Archivist Meetings: Never  . Marital Status: Married  Human resources officer Violence: Not At Risk  . Fear of Current or Ex-Partner: No  . Emotionally Abused: No  . Physically Abused: No  . Sexually Abused: No     Review of Systems: See HPI, otherwise negative ROS  Physical Exam: BP 122/83   Pulse 77   Temp 98.3 F (36.8 C) (Oral)   Resp 18   Ht 5' 7"  (1.702 m)   Wt 86.2 kg   SpO2 96%   BMI 29.76 kg/m  General:   Alert,  Well-developed, well-nourished, pleasant and cooperative in NAD Mouth:  No deformity or lesions. Neck:  Supple; no masses or thyromegaly. No significant cervical adenopathy. Lungs:  Clear throughout to auscultation.   No wheezes, crackles, or rhonchi. No acute distress. Heart:  Regular rate and rhythm; no murmurs, clicks, rubs,  or gallops. Abdomen: Non-distended, normal bowel sounds.  Soft and nontender without appreciable  mass or hepatosplenomegaly.  Pulses:  Normal pulses noted. Extremities:  Without clubbing or edema.  Impression/Plan: 34 year old lady with history of grade 1 follicular cell lymphoma of the duodenum.  Being treated by oncology.  Here for surveillance EGD and biopsy. I have offered the patient a EGD with biopsy as appropriate per plan. The risks, benefits, limitations, alternatives and imponderables have been reviewed with the patient. Potential for esophageal dilation, biopsy, etc. have also been reviewed.  Questions have been answered. All parties agreeable.      Notice: This dictation was prepared with Dragon dictation along with smaller phrase technology. Any transcriptional errors that result from this process are unintentional and may not be corrected upon review.

## 2020-05-09 NOTE — Discharge Instructions (Signed)
EGD Discharge instructions Please read the instructions outlined below and refer to this sheet in the next few weeks. These discharge instructions provide you with general information on caring for yourself after you leave the hospital. Your doctor may also give you specific instructions. While your treatment has been planned according to the most current medical practices available, unavoidable complications occasionally occur. If you have any problems or questions after discharge, please call your doctor. ACTIVITY  You may resume your regular activity but move at a slower pace for the next 24 hours.   Take frequent rest periods for the next 24 hours.   Walking will help expel (get rid of) the air and reduce the bloated feeling in your abdomen.   No driving for 24 hours (because of the anesthesia (medicine) used during the test).   You may shower.   Do not sign any important legal documents or operate any machinery for 24 hours (because of the anesthesia used during the test).  NUTRITION  Drink plenty of fluids.   You may resume your normal diet.   Begin with a light meal and progress to your normal diet.   Avoid alcoholic beverages for 24 hours or as instructed by your caregiver.  MEDICATIONS  You may resume your normal medications unless your caregiver tells you otherwise.  WHAT YOU CAN EXPECT TODAY  You may experience abdominal discomfort such as a feeling of fullness or "gas" pains.  FOLLOW-UP  Your doctor will discuss the results of your test with you.  SEEK IMMEDIATE MEDICAL ATTENTION IF ANY OF THE FOLLOWING OCCUR:  Excessive nausea (feeling sick to your stomach) and/or vomiting.   Severe abdominal pain and distention (swelling).   Trouble swallowing.   Temperature over 101 F (37.8 C).   Rectal bleeding or vomiting of blood.    Area of abnormality in duodenum seen previously has gotten about 60% smaller.  Everything else looked okay.  Biopsies taken.  Further  recommendations to follow pending review of pathology report  Patient request, I called Renee at [phone] -reviewed results  PATIENT INSTRUCTIONS POST-ANESTHESIA  IMMEDIATELY FOLLOWING SURGERY:  Do not drive or operate machinery for the first twenty four hours after surgery.  Do not make any important decisions for twenty four hours after surgery or while taking narcotic pain medications or sedatives.  If you develop intractable nausea and vomiting or a severe headache please notify your doctor immediately.  FOLLOW-UP:  Please make an appointment with your surgeon as instructed. You do not need to follow up with anesthesia unless specifically instructed to do so.  WOUND CARE INSTRUCTIONS (if applicable):  Keep a dry clean dressing on the anesthesia/puncture wound site if there is drainage.  Once the wound has quit draining you may leave it open to air.  Generally you should leave the bandage intact for twenty four hours unless there is drainage.  If the epidural site drains for more than 36-48 hours please call the anesthesia department.  QUESTIONS?:  Please feel free to call your physician or the hospital operator if you have any questions, and they will be happy to assist you.     Acetaminophen; Hydrocodone tablets or capsules What is this medicine? ACETAMINOPHEN; HYDROCODONE (a set a MEE noe fen; hye droe KOE done) is a pain reliever. It is used to treat moderate to severe pain. This medicine may be used for other purposes; ask your health care provider or pharmacist if you have questions. COMMON BRAND NAME(S): Anexsia, Bancap HC, Ceta-Plus,  Co-Gesic, Comfortpak, Dolagesic, [employer], DuoCet, [place], Hydrogesic, [place], [place] HD, Lorcet Plus, Lortab, Margesic H, Maxidone, [place], Polygesic, Physiological scientist, [place], Cabin crew, Vicodin, Vicodin ES, Vicodin HP, Charlane Ferretti What should I tell my health care provider before I take this medicine? They need to know if you have any of these  conditions:  brain tumor  Crohn's disease, inflammatory bowel disease, or ulcerative colitis  drug abuse or addiction  head injury  heart or circulation problems  if you often drink alcohol  kidney disease or problems going to the bathroom  liver disease  lung disease, asthma, or breathing problems  an unusual or allergic reaction to acetaminophen, hydrocodone, other opioid analgesics, other medicines, foods, dyes, or preservatives  pregnant or trying to get pregnant  breast-feeding How should I use this medicine? Take this medicine by mouth with a glass of water. Follow the directions on the prescription label. You can take it with or without food. If it upsets your stomach, take it with food. Do not take your medicine more often than directed. A special MedGuide will be given to you by the pharmacist with each prescription and refill. Be sure to read this information carefully each time. Talk to your pediatrician regarding the use of this medicine in children. Special care may be needed. Overdosage: If you think you have taken too much of this medicine contact a poison control center or emergency room at once. NOTE: This medicine is only for you. Do not share this medicine with others. What if I miss a dose? If you miss a dose, take it as soon as you can. If it is almost time for your next dose, take only that dose. Do not take double or extra doses. What may interact with this medicine? This medicine may interact with the following medications:  alcohol  antiviral medicines for HIV or AIDS  atropine  antihistamines for allergy, cough and cold  certain antibiotics like erythromycin, clarithromycin  certain medicines for anxiety or sleep  certain medicines for bladder problems like oxybutynin, tolterodine  certain medicines for depression like amitriptyline, fluoxetine, sertraline  certain medicines for fungal infections like ketoconazole and  itraconazole  certain medicines for Parkinson's disease like benztropine, trihexyphenidyl  certain medicines for seizures like carbamazepine, phenobarbital, phenytoin, primidone  certain medicines for stomach problems like dicyclomine, hyoscyamine  certain medicines for travel sickness like scopolamine  general anesthetics like halothane, isoflurane, methoxyflurane, propofol  ipratropium  local anesthetics like lidocaine, pramoxine, tetracaine  MAOIs like Carbex, Eldepryl, Marplan, Nardil, and Parnate  medicines that relax muscles for surgery  other medicines with acetaminophen  other narcotic medicines for pain or cough  phenothiazines like chlorpromazine, mesoridazine, prochlorperazine, thioridazine  rifampin This list may not describe all possible interactions. Give your health care provider a list of all the medicines, herbs, non-prescription drugs, or dietary supplements you use. Also tell them if you smoke, drink alcohol, or use illegal drugs. Some items may interact with your medicine. What should I watch for while using this medicine? Tell your health care provider if your pain does not go away, if it gets worse, or if you have new or a different type of pain. You may develop tolerance to this drug. Tolerance means that you will need a higher dose of the drug for pain relief. Tolerance is normal and is expected if you take this drug for a long time. There are different types of narcotic drugs (opioids) for pain. If you take more than one type at the same  time, you may have more side effects. Give your health care provider a list of all drugs you use. He or she will tell you how much drug to take. Do not take more drug than directed. Get emergency help right away if you have problems breathing. Do not suddenly stop taking your drug because you may develop a severe reaction. Your body becomes used to the drug. This does NOT mean you are addicted. Addiction is a behavior related  to getting and using a drug for a nonmedical reason. If you have pain, you have a medical reason to take pain drug. Your health care provider will tell you how much drug to take. If your health care provider wants you to stop the drug, the dose will be slowly lowered over time to avoid any side effects. Talk to your health care provider about naloxone and how to get it. Naloxone is an emergency drug used for an opioid overdose. An overdose can happen if you take too much opioid. It can also happen if an opioid is taken with some other drugs or substances, like alcohol. Know the symptoms of an overdose, like trouble breathing, unusually tired or sleepy, or not being able to respond or wake up. Make sure to tell caregivers and close contacts where it is stored. Make sure they know how to use it. After naloxone is given, you must get emergency help right away. Naloxone is a temporary treatment. Repeat doses may be needed. Do not take other drugs that contain acetaminophen with this drug. Many non-prescription drugs contain acetaminophen. Always read labels carefully. If you have questions, ask your health care provider. If you take too much acetaminophen, get medical help right away. Too much acetaminophen can be very dangerous and cause liver damage. Even if you do not have symptoms, it is important to get help right away. You may get drowsy or dizzy. Do not drive, use machinery, or do anything that needs mental alertness until you know how this drug affects you. Do not stand up or sit up quickly, especially if you are an older patient. This reduces the risk of dizzy or fainting spells. Alcohol may interfere with the effect of this drug. Avoid alcoholic drinks. This drug will cause constipation. If you do not have a bowel movement for 3 days, call your health care provider. Your mouth may get dry. Chewing sugarless gum or sucking hard candy and drinking plenty of water may help. Contact your health care provider  if the problem does not go away or is severe. What side effects may I notice from receiving this medicine? Side effects that you should report to your doctor or health care professional as soon as possible:  allergic reactions like skin rash, itching or hives, swelling of the face, lips, or tongue  breathing problems  confusion  redness, blistering, peeling or loosening of the skin, including inside the mouth  signs and symptoms of low blood pressure like dizziness; feeling faint or lightheaded, falls; unusually weak or tired  trouble passing urine or change in the amount of urine  yellowing of the eyes or skin Side effects that usually do not require medical attention (report to your doctor or health care professional if they continue or are bothersome):  constipation  dry mouth  nausea, vomiting  tiredness This list may not describe all possible side effects. Call your doctor for medical advice about side effects. You may report side effects to FDA at 1-800-FDA-1088. Where should I keep my  medicine? Keep out of the reach of children. This medicine can be abused. Keep your medicine in a safe place to protect it from theft. Do not share this medicine with anyone. Selling or giving away this medicine is dangerous and against the law. Store at room temperature between 15 and 30 degrees C (59 and 86 degrees F). This medicine may cause harm and death if it is taken by other adults, children, or pets. Return medicine that has not been used to an official disposal site. Contact the DEA at (646) 665-7347 or your city/county government to find a site. If you cannot return the medicine, flush it down the toilet. Do not use the medicine after the expiration date. NOTE: This sheet is a summary. It may not cover all possible information. If you have questions about this medicine, talk to your doctor, pharmacist, or health care provider.  2020 Elsevier/Gold Standard (2019-02-14  12:05:41)

## 2020-05-09 NOTE — Op Note (Signed)
[facility] Patient Name: Betty Hayes Procedure Date: 05/09/2020 7:08 AM MRN: [id number] Date of Birth: 06/10/1986 Attending MD: Norvel Richards , MD CSN: [id number] Age: 34 Admit Type: Outpatient Procedure:                Upper GI endoscopy Indications:              Surveillance for malignancy due to personal history                            of duodenal follicular cell lymphoma Providers:                Norvel Richards, MD, Lurline Del, RN, Dereck Leep, Technician, Aram Candela Referring MD:              Medicines:                Propofol per Anesthesia Complications:            No immediate complications. Estimated Blood Loss:     Estimated blood loss was minimal. Procedure:                Pre-Anesthesia Assessment:                           - Prior to the procedure, a History and Physical                            was performed, and patient medications and                            allergies were reviewed. The patient's tolerance of                            previous anesthesia was also reviewed. The risks                            and benefits of the procedure and the sedation                            options and risks were discussed with the patient.                            All questions were answered, and informed consent                            was obtained. Prior Anticoagulants: The patient has                            taken no previous anticoagulant or antiplatelet                            agents. ASA Grade Assessment: II - A patient with  mild systemic disease. After reviewing the risks                            and benefits, the patient was deemed in                            satisfactory condition to undergo the procedure.                           After obtaining informed consent, the endoscope was                            passed under direct vision. Throughout the                             procedure, the patient's blood pressure, pulse, and                            oxygen saturations were monitored continuously. The                            [id number]) was introduced through the mouth,                            and advanced to the third part of duodenum. The                            upper GI endoscopy was accomplished without                            difficulty. The patient tolerated the procedure                            well. Scope In: 7:47:30 AM Scope Out: 7:54:32 AM Total Procedure Duration: 0 hours 7 minutes 2 seconds  Findings:      The examined esophagus was normal.      The entire examined stomach was normal.      Area of abnormality seen previously in the distal second portion of the       duodenum was again identified. However, this was a smaller area of       nodularity (approximately 60% smaller than area seen 1 year ago based on       photographs). The remainder of the first second third portion of       duodenum appeared entirely normal. The area of abnormality was biopsied. Impression:               - Normal esophagus.                           - Normal stomach.                           Regression of abnormal duodenal mucosa seen today  as compared to prior findings?"status post biopsy Moderate Sedation:      Moderate (conscious) sedation was administered by the endoscopy nurse       and supervised by the endoscopist. The following parameters were       monitored: oxygen saturation, heart rate, blood pressure, respiratory       rate, EKG, adequacy of pulmonary ventilation, and response to care.       Total physician intraservice time was 17 minutes.      Moderate (conscious) sedation was administered by the endoscopy nurse       and supervised by the endoscopist. The following parameters were       monitored: oxygen saturation, heart rate, blood pressure, respiratory       rate, EKG,  adequacy of pulmonary ventilation, and response to care.       Total physician intraservice time was 17 minutes. Recommendation:           - Patient has a contact number available for                            emergencies. The signs and symptoms of potential                            delayed complications were discussed with the                            patient. Return to normal activities tomorrow.                            Written discharge instructions were provided to the                            patient.                           - Advance diet as tolerated.                           - Continue present medications.                           - Repeat upper endoscopy (date not yet determined)                            for surveillance.                           - Return to GI office (date not yet determined). Procedure Code(s):        --- Professional ---                           [phone], Esophagogastroduodenoscopy, flexible,                            transoral; diagnostic, including collection of                            specimen(s) by brushing or washing, when  performed                            (separate procedure)                           G0500, Moderate sedation services provided by the                            same physician or other qualified health care                            professional performing a gastrointestinal                            endoscopic service that sedation supports,                            requiring the presence of an independent trained                            observer to assist in the monitoring of the                            patient's level of consciousness and physiological                            status; initial 15 minutes of intra-service time;                            patient age 58 years or older (additional time may                            be reported with [phone], as appropriate)                           G0500,  Moderate sedation services provided by the                            same physician or other qualified health care                            professional performing a gastrointestinal                            endoscopic service that sedation supports,                            requiring the presence of an independent trained                            observer to assist in the monitoring of the                            patient's level of consciousness and physiological  status; initial 15 minutes of intra-service time;                            patient age 15 years or older (additional time may                            be reported with [phone], as appropriate) Diagnosis Code(s):        --- Professional ---                           K22.70, Barrett's esophagus without dysplasia CPT copyright 2019 [employer]. All rights reserved. The codes documented in this report are preliminary and upon coder review may  be revised to meet current compliance requirements. Cristopher Estimable. Yariah Selvey, MD Norvel Richards, MD 05/09/2020 8:13:34 AM This report has been signed electronically. Number of Addenda: 0

## 2020-05-11 ENCOUNTER — Encounter: Payer: Self-pay | Admitting: Internal Medicine

## 2020-05-11 LAB — SURGICAL PATHOLOGY

## 2020-05-14 ENCOUNTER — Encounter (HOSPITAL_COMMUNITY): Payer: Self-pay | Admitting: Internal Medicine

## 2020-05-16 ENCOUNTER — Other Ambulatory Visit: Payer: Self-pay

## 2020-05-16 ENCOUNTER — Telehealth: Payer: Self-pay | Admitting: Internal Medicine

## 2020-05-16 ENCOUNTER — Other Ambulatory Visit (HOSPITAL_COMMUNITY)
Admission: RE | Admit: 2020-05-16 | Discharge: 2020-05-16 | Disposition: A | Discharge status: Home / Self Care | Payer: BC Managed Care – PPO | Source: Ambulatory Visit | Attending: Gastroenterology | Admitting: Gastroenterology

## 2020-05-16 DIAGNOSIS — K625 Hemorrhage of anus and rectum: Secondary | ICD-10-CM

## 2020-05-16 DIAGNOSIS — C8209 Follicular lymphoma grade I, extranodal and solid organ sites: Secondary | ICD-10-CM | POA: Insufficient documentation

## 2020-05-16 LAB — CBC WITH DIFFERENTIAL/PLATELET
Abs Immature Granulocytes: 0.04 10*3/uL (ref 0.00–0.07)
Basophils Absolute: 0.1 10*3/uL (ref 0.0–0.1)
Basophils Relative: 1 %
Eosinophils Absolute: 0.3 10*3/uL (ref 0.0–0.5)
Eosinophils Relative: 3 %
HCT: 41.3 % (ref 36.0–46.0)
Hemoglobin: 13.6 g/dL (ref 12.0–15.0)
Immature Granulocytes: 0 %
Lymphocytes Relative: 24 %
Lymphs Abs: 2.3 10*3/uL (ref 0.7–4.0)
MCH: 28.1 pg (ref 26.0–34.0)
MCHC: 32.9 g/dL (ref 30.0–36.0)
MCV: 85.3 fL (ref 80.0–100.0)
Monocytes Absolute: 0.7 10*3/uL (ref 0.1–1.0)
Monocytes Relative: 8 %
Neutro Abs: 6 10*3/uL (ref 1.7–7.7)
Neutrophils Relative %: 64 %
Platelets: 232 10*3/uL (ref 150–400)
RBC: 4.84 MIL/uL (ref 3.87–5.11)
RDW: 12.2 % (ref 11.5–15.5)
WBC: 9.4 10*3/uL (ref 4.0–10.5)
nRBC: 0 % (ref 0.0–0.2)

## 2020-05-16 LAB — LACTATE DEHYDROGENASE: LDH: 113 U/L (ref 98–192)

## 2020-05-16 LAB — COMPREHENSIVE METABOLIC PANEL
ALT: 18 U/L (ref 0–44)
AST: 19 U/L (ref 15–41)
Albumin: 4.3 g/dL (ref 3.5–5.0)
Alkaline Phosphatase: 63 U/L (ref 38–126)
Anion gap: 9 (ref 5–15)
BUN: 15 mg/dL (ref 6–20)
CO2: 25 mmol/L (ref 22–32)
Calcium: 9.7 mg/dL (ref 8.9–10.3)
Chloride: 104 mmol/L (ref 98–111)
Creatinine, Ser: 0.58 mg/dL (ref 0.44–1.00)
GFR, Estimated: >60 mL/min (ref >60)
Glucose, Bld: 95 mg/dL (ref 70–99)
Potassium: 3.8 mmol/L (ref 3.5–5.1)
Sodium: 138 mmol/L (ref 135–145)
Total Bilirubin: 0.8 mg/dL (ref 0.3–1.2)
Total Protein: 7.8 g/dL (ref 6.5–8.1)

## 2020-05-16 LAB — URIC ACID: Uric Acid, Serum: 5.7 mg/dL (ref 2.5–7.1)

## 2020-05-16 NOTE — Telephone Encounter (Signed)
Spoke with pt. Pt had a loose stool once yesterday morning around 7 AM. Pt hasn't had loose stool in the last 3-4 months per pt. Pt didn't have watery stool. Pt had some mild abdominal cramping last night that made her feel like it was time to have a BM. When pt went to the bathroom, gas and blood came out. Pt isn't sure how much came out when asked. Blood was bright red and when wiping, it was a good amount. 1 hour later, the process repeated itself. Mild abdominal cramping started and pt had gas with bright red blood come out. Pt did mention that she usually has a BM every morning around 7 AM and this morning 05/16/2020, she didn't have a BM. No blood was seen this morning. Pt didn't feel lightheaded, no dizziness, no n/v no feelings of passing out last night nor this morning.

## 2020-05-16 NOTE — Telephone Encounter (Signed)
Recommend CBC today. Dx: Rectal bleeding.   Would also like to get her scheduled with the next available provider (any [facility] or Dr. Gala Romney) to discuss further.   If she has return of diarrhea, she should let us know and we can order stool studies.   If she has return of any significant rectal bleeding or feels lightheaded, dizzy, like she may pass out, or has significant weakness, she proceed to the emergency room.

## 2020-05-16 NOTE — Telephone Encounter (Signed)
This is the second phone message received. See other phone note for documentation.

## 2020-05-16 NOTE — Telephone Encounter (Signed)
Call patient. [phone]

## 2020-05-16 NOTE — Telephone Encounter (Signed)
Spoke with pt. Pt was scheduled with [facility] on 05/24/20. Lab orders were placed and faxed to [facility] per pts request. Pt is aware if her symptoms change, feels lightheaded, weakness or feelings of passing out, she is to go to the ED.

## 2020-05-16 NOTE — Telephone Encounter (Signed)
Call patient [phone]   She is pooping blood

## 2020-05-21 ENCOUNTER — Other Ambulatory Visit: Payer: Self-pay

## 2020-05-21 ENCOUNTER — Other Ambulatory Visit (HOSPITAL_COMMUNITY): Payer: BC Managed Care – PPO

## 2020-05-21 ENCOUNTER — Inpatient Hospital Stay (HOSPITAL_COMMUNITY): Payer: BC Managed Care – PPO | Attending: Hematology

## 2020-05-21 DIAGNOSIS — E063 Autoimmune thyroiditis: Secondary | ICD-10-CM | POA: Insufficient documentation

## 2020-05-21 DIAGNOSIS — C8209 Follicular lymphoma grade I, extranodal and solid organ sites: Secondary | ICD-10-CM | POA: Diagnosis not present

## 2020-05-21 LAB — CBC WITH DIFFERENTIAL/PLATELET
Abs Immature Granulocytes: 0.03 10*3/uL (ref 0.00–0.07)
Basophils Absolute: 0.1 10*3/uL (ref 0.0–0.1)
Basophils Relative: 1 %
Eosinophils Absolute: 0.2 10*3/uL (ref 0.0–0.5)
Eosinophils Relative: 3 %
HCT: 40.3 % (ref 36.0–46.0)
Hemoglobin: 13.3 g/dL (ref 12.0–15.0)
Immature Granulocytes: 0 %
Lymphocytes Relative: 27 %
Lymphs Abs: 1.9 10*3/uL (ref 0.7–4.0)
MCH: 28.4 pg (ref 26.0–34.0)
MCHC: 33 g/dL (ref 30.0–36.0)
MCV: 86.1 fL (ref 80.0–100.0)
Monocytes Absolute: 0.6 10*3/uL (ref 0.1–1.0)
Monocytes Relative: 8 %
Neutro Abs: 4.4 10*3/uL (ref 1.7–7.7)
Neutrophils Relative %: 61 %
Platelets: 188 10*3/uL (ref 150–400)
RBC: 4.68 MIL/uL (ref 3.87–5.11)
RDW: 12.5 % (ref 11.5–15.5)
WBC: 7.2 10*3/uL (ref 4.0–10.5)
nRBC: 0 % (ref 0.0–0.2)

## 2020-05-21 LAB — COMPREHENSIVE METABOLIC PANEL
ALT: 17 U/L (ref 0–44)
AST: 21 U/L (ref 15–41)
Albumin: 4.2 g/dL (ref 3.5–5.0)
Alkaline Phosphatase: 58 U/L (ref 38–126)
Anion gap: 8 (ref 5–15)
BUN: 12 mg/dL (ref 6–20)
CO2: 20 mmol/L — ABNORMAL LOW (ref 22–32)
Calcium: 9.2 mg/dL (ref 8.9–10.3)
Chloride: 106 mmol/L (ref 98–111)
Creatinine, Ser: 0.64 mg/dL (ref 0.44–1.00)
GFR, Estimated: >60 mL/min (ref >60)
Glucose, Bld: 106 mg/dL — ABNORMAL HIGH (ref 70–99)
Potassium: 3.9 mmol/L (ref 3.5–5.1)
Sodium: 134 mmol/L — ABNORMAL LOW (ref 135–145)
Total Bilirubin: 1.1 mg/dL (ref 0.3–1.2)
Total Protein: 7.4 g/dL (ref 6.5–8.1)

## 2020-05-21 LAB — URIC ACID: Uric Acid, Serum: 5 mg/dL (ref 2.5–7.1)

## 2020-05-21 LAB — LACTATE DEHYDROGENASE: LDH: 120 U/L (ref 98–192)

## 2020-05-24 ENCOUNTER — Encounter: Payer: Self-pay | Admitting: Nurse Practitioner

## 2020-05-24 ENCOUNTER — Other Ambulatory Visit: Payer: Self-pay

## 2020-05-24 ENCOUNTER — Encounter: Payer: Self-pay | Admitting: Internal Medicine

## 2020-05-24 ENCOUNTER — Ambulatory Visit (INDEPENDENT_AMBULATORY_CARE_PROVIDER_SITE_OTHER): Payer: BC Managed Care – PPO | Admitting: Nurse Practitioner

## 2020-05-24 VITALS — BP 122/80 | HR 82 | Temp 96.6°F | Ht 67.0 in | Wt 196.2 lb

## 2020-05-24 DIAGNOSIS — K625 Hemorrhage of anus and rectum: Secondary | ICD-10-CM

## 2020-05-24 DIAGNOSIS — C8209 Follicular lymphoma grade I, extranodal and solid organ sites: Secondary | ICD-10-CM

## 2020-05-24 DIAGNOSIS — R1084 Generalized abdominal pain: Secondary | ICD-10-CM

## 2020-05-24 DIAGNOSIS — R6881 Early satiety: Secondary | ICD-10-CM

## 2020-05-24 DIAGNOSIS — R109 Unspecified abdominal pain: Secondary | ICD-10-CM | POA: Insufficient documentation

## 2020-05-24 NOTE — Patient Instructions (Signed)
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 Follow-up with Dr. Raliegh Ip at oncology next week as scheduled 2. Further recommendations will come from Dr. Raliegh Ip, especially regarding whether to repeat an upper endoscopy at some point  Abdominal pain with rectal bleeding: 1. I am glad your symptoms have resolved 2. Let us know if you have any further abdominal pain or rectal bleeding 3. As we discussed, your labs look great with no significant blood loss 4. I will speak with Dr. Gala Romney about possibly needing to complete a colonoscopy at some point for the rectal bleeding 5. Call us if you have any worsening or severe symptoms  Decreased appetite/decreased desire to eat: 1. Continue your protein supplements as you have been to help stay well-nourished 2. Let us know if you note any significant weight loss  Overall I recommend:  1. Continue your other current medications 2. Return for follow-up in 2 months 3. Call us for any questions or concerns   ---------------------------------------------------------------  I am glad you have gotten your COVID-19 vaccination!  Even though you are fully vaccinated you should continue to follow CDC and state/local guidelines.  ---------------------------------------------------------------   At [facility] we value your feedback. You may receive a survey about your visit today. Please share your experience as we strive to create trusting relationships with our patients to provide genuine, compassionate, quality care.  We appreciate your understanding and patience as we review any laboratory studies, imaging, and other diagnostic tests that are ordered as we care for you. Our office policy is 5 business days for review of these results, and any emergent or urgent results are addressed in a timely manner for your best interest. If you do not hear from our office in 1 week, please contact us.   We also  encourage the use of MyChart, which contains your medical information for your review as well. If you are not enrolled in this feature, an access code is on this after visit summary for your convenience. Thank you for allowing [place] to be involved in your care.  It was great to see you today!  I hope you have a Happy Thanksgiving!!

## 2020-05-24 NOTE — Progress Notes (Signed)
Referring Provider: Dettinger, Fransisca Kaufmann, MD Primary Care Physician:  Dettinger, Fransisca Kaufmann, MD Primary GI:  Dr. Gala Romney  Chief Complaint  Patient presents with  . Rectal Bleeding    occurred twice in one day last week    HPI:   Betty Hayes is a 34 y.o. female who presents for follow-up on rectal bleeding.  The patient was last seen in our office 02/06/2020 for GERD, dysphagia, nausea, grade 1 follicular lymphoma of extranodal site, diarrhea.  Noted history of hypothyroidism secondary to Hashimoto's, dysphagia, GERD.  BPE in 2020 with moderate GERD and no evidence of stricture.  Recommended SLP referral for M BSS but the patient canceled.  1.3 cm solitary nodule/pseudonodule in the thyroid status post steroid treatment with some improvement in swallowing afterward.  Laryngoscopy with ENT 04/25/2019 without masses slight prominence of lingual tonsil tissue at the midline base of the tongue.  CT neck with contrast in 2020 normal.  EGD 07/13/2019 with normal esophagus status post dilation with patchy areas of whitish nodular mucosa in the second portion of the duodenum status post biopsy found to be low-grade follicular lymphoma.  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  Oncology last saw the patient 01/25/2020 recommend EGD and possible biopsy in 3 to 6 months from the end of April to evaluate response, which would be due October 2021 through January 2021.  At her last visit noted persistent diarrhea for 1-1/2 months now resolved status post Bactrim.  No further significant dysphagia.  GERD improved, following gluten-free diet.  Currently on Protonix as needed.  Intermittent nausea without needed Zofran which began with cancer treatment in April but did not resolve after chemotherapy completed.  She does note she gets full quickly, eating very small portions, 12 pound weight loss in 6 months.  Recommended continue  Protonix, notify us of any persistent or not improving symptoms, avoid reflux triggers, recommendations for timing of EGD to follow.  Oncology recommended holding off on EGD until October 2021.  This is subsequently completed 05/09/2020 which found normal esophagus, normal stomach.  Area of abnormality seen previously in the distal second duodenum again identified which was smaller (approximately 60% smaller) and is status post biopsy.  Surgical pathology found the biopsies to be involvement by low-grade follicular lymphoma (grade 1-2 of 3).  Patient called our office 05/16/2020 indicating an episode of loose stools with some mild abdominal cramping and noted hematochezia though unsure how much.  She had a second episode an hour after.  Recommended CBC and follow-up office visit.  Notify us of any recurrent diarrhea.  Cautioned against symptoms of anemia with ER precautions.  CBC completed 05/16/2020 which showed normal hemoglobin at 13.6 (higher than 4 months prior 12.3).  A repeat CBC 05/21/2020 showed stable hemoglobin at 13.3.  Today she states she doing okay overall. She has an appointment with Dr. Raliegh Ip with oncology next week. Still with early satiety (which is generally new for her in the past few months, since having swallowing problems). Does use protein drinks to keep nourished. No significant subjective weight loss; objectively stable. No significant GERD symptoms, other than occasional/rare (last time when she had her episode of rectal bleeding). This occurred about a week ago and had associated abdominal cramping, hematochezia; only had 2 episodes over the course of one day; no recurrence since. Denies ongoing abdominal pain, N/V. Denies melena, fever, chills, unintentional weight loss. Denies URI or flu-like symptoms.  Denies loss of sense of taste or smell. The patient has received COVID-19 vaccination(s). Denies chest pain, dyspnea, dizziness, lightheadedness, syncope, near syncope. Denies any  other upper or lower GI symptoms.   Past Medical History:  Diagnosis Date  . Allergy   . Cancer ([place]) 22/0254   follicular lymphoma  . Dysphagia   . GERD (gastroesophageal reflux disease)   . Thyroid disease    Hypothyroidism  . Thyroid nodule 2020    Past Surgical History:  Procedure Laterality Date  . ABDOMINAL HYSTERECTOMY    . BIOPSY  05/09/2020   Procedure: BIOPSY;  Surgeon: Daneil Dolin, MD;  Location: [facility];  Service: Endoscopy;;  duodenal  . ESOPHAGOGASTRODUODENOSCOPY N/A 07/13/2019   Procedure: ESOPHAGOGASTRODUODENOSCOPY (EGD);  Surgeon: Daneil Dolin, MD; normal esophagus s/p dilation and biopsied, normal stomach, patchy areas of whitish, nodular mucosa in the second portion of the duodenum s/p biopsy.  Duodenal biopsy with low-grade follicular lymphoma.  Esophageal biopsies benign without increased intraepithelial eosinophils.  . ESOPHAGOGASTRODUODENOSCOPY N/A 05/09/2020   Procedure: ESOPHAGOGASTRODUODENOSCOPY (EGD);  Surgeon: Daneil Dolin, MD;  Location: [facility];  Service: Endoscopy;  Laterality: N/A;  7:30pm  . KIDNEY STONE SURGERY    . MALONEY DILATION N/A 07/13/2019   Procedure: Venia Minks DILATION;  Surgeon: Daneil Dolin, MD;  Location: [facility];  Service: Endoscopy;  Laterality: N/A;  . TONSILLECTOMY     Around age 64  . WISDOM TOOTH EXTRACTION      Current Outpatient Medications  Medication Sig Dispense Refill  . Ascorbic Acid (VITAMIN C PO) Take 1 tablet by mouth daily.    . Bacillus Coagulans-Inulin (PROBIOTIC-PREBIOTIC PO) Take by mouth daily.     . Cholecalciferol (VITAMIN D3) 50 MCG (2000 UT) TABS Take 2,000 Units by mouth daily.    Marland Kitchen ECHINACEA PO Take 760 mg by mouth at bedtime.    . fexofenadine (ALLEGRA) 180 MG tablet Take 180 mg by mouth daily as needed for allergies.     Marland Kitchen liothyronine (CYTOMEL) 5 MCG tablet Take 5 mcg by mouth daily.    Marland Kitchen MAGNESIUM GLUCONATE PO Take 240 mg by mouth at bedtime. 120 mg    . Multiple  Vitamins-Minerals (MULTIVITAMIN ADULT PO) Take 1 tablet by mouth daily.     . pantoprazole (PROTONIX) 40 MG tablet Take 1 tablet (40 mg total) by mouth 2 (two) times daily before a meal. (Patient taking differently: Take 40 mg by mouth as needed (reflux). ) 60 tablet 4  . PREBIOTIC PRODUCT PO Take 330 mg by mouth daily. enzymes    . prochlorperazine (COMPAZINE) 10 MG tablet Take 1 tablet (10 mg total) by mouth every 6 (six) hours as needed for nausea or vomiting. 30 tablet 2  . Selenium 200 MCG CAPS Take 200 mcg by mouth at bedtime.    Marland Kitchen spironolactone (ALDACTONE) 25 MG tablet Take 25 mg by mouth daily.    Marland Kitchen SYNTHROID 88 MCG tablet Take 88 mcg by mouth every morning.     . Zinc 23 MG LOZG Take 1 lozenge by mouth daily.     No current facility-administered medications for this visit.    Allergies as of 05/24/2020 - Review Complete 05/24/2020  Allergen Reaction Noted  . Latex Rash 01/03/2016    Family History  Problem Relation Age of Onset  . Thrombocytopenia Mother        itp  . Heart disease Mother   . Hypertension Mother   . Seizures Brother   .  ADD / ADHD Brother   . Colon cancer Paternal Grandfather        in 8s  . Diabetes Maternal Grandmother   . Stroke Maternal Grandmother   . Heart disease Maternal Grandfather   . Stroke Maternal Grandfather   . Hypothyroidism Paternal Grandmother   . Psoriasis Brother   . Asthma Daughter   . Kidney disease Maternal Uncle   . Non-Hodgkin's lymphoma Paternal Uncle   . Leukemia Paternal Great-grandmother   . Ovarian cancer Maternal Great-grandmother   . Breast cancer Other        maternal great aunt  . Colon polyps Neg Hx     Social History   Socioeconomic History  . Marital status: Married    Spouse name: Not on file  . Number of children: 2  . Years of education: Not on file  . Highest education level: Not on file  Occupational History    Employer: [employer]  Tobacco Use  . Smoking status: Never Smoker  . Smokeless  tobacco: Never Used  Vaping Use  . Vaping Use: Never used  Substance and Sexual Activity  . Alcohol use: Yes    Comment: occasional- maybe once a month  . Drug use: No  . Sexual activity: Yes    Birth control/protection: Surgical    Comment: married 14 years  Other Topics Concern  . Not on file  Social History Narrative  . Not on file   Social Determinants of Health   Financial Resource Strain: Low Risk   . Difficulty of Paying Living Expenses: Not hard at all  Food Insecurity: No Food Insecurity  . Worried About Charity fundraiser in the Last Year: Never true  . Ran Out of Food in the Last Year: Never true  Transportation Needs: No Transportation Needs  . Lack of Transportation (Medical): No  . Lack of Transportation (Non-Medical): No  Physical Activity: Insufficiently Active  . Days of Exercise per Week: 5 days  . Minutes of Exercise per Session: 20 min  Stress: Stress Concern Present  . Feeling of Stress : To some extent  Social Connections: Moderately Isolated  . Frequency of Communication with Friends and Family: More than three times a week  . Frequency of Social Gatherings with Friends and Family: More than three times a week  . Attends Religious Services: Never  . Active Member of Clubs or Organizations: No  . Attends Archivist Meetings: Never  . Marital Status: Married    Subjective: Review of Systems  Constitutional: Negative for chills, fever, malaise/fatigue and weight loss.  HENT: Negative for congestion and sore throat.   Respiratory: Negative for cough and shortness of breath.   Cardiovascular: Negative for chest pain and palpitations.  Gastrointestinal: Positive for blood in stool. Negative for abdominal pain, constipation, diarrhea, heartburn, melena, nausea and vomiting.       Early sateity. Decreased interest in food  Musculoskeletal: Negative for joint pain and myalgias.  Skin: Negative for rash.  Neurological: Negative for dizziness  and weakness.  Endo/Heme/Allergies: Does not bruise/bleed easily.  Psychiatric/Behavioral: Negative for depression. The patient is not nervous/anxious.   All other systems reviewed and are negative.    Objective: BP 122/80   Pulse 82   Temp (!) 96.6 F (35.9 C) (Temporal)   Ht 5' 7"  (1.702 m)   Wt 196 lb 3.2 oz (89 kg)   BMI 30.73 kg/m  Physical Exam Vitals and nursing note reviewed.  Constitutional:  General: She is not in acute distress.    Appearance: Normal appearance. She is well-developed. She is obese. She is not ill-appearing, toxic-appearing or diaphoretic.  HENT:     Head: Normocephalic and atraumatic.     Nose: No congestion or rhinorrhea.  Eyes:     General: No scleral icterus. Cardiovascular:     Rate and Rhythm: Normal rate and regular rhythm.     Heart sounds: Normal heart sounds.  Pulmonary:     Effort: Pulmonary effort is normal. No respiratory distress.     Breath sounds: Normal breath sounds.  Abdominal:     General: Bowel sounds are normal.     Palpations: Abdomen is soft. There is no hepatomegaly, splenomegaly or mass.     Tenderness: There is no abdominal tenderness. There is no guarding or rebound.     Hernia: No hernia is present.  Skin:    General: Skin is warm and dry.     Coloration: Skin is not jaundiced.     Findings: No rash.  Neurological:     General: No focal deficit present.     Mental Status: She is alert and oriented to person, place, and time.  Psychiatric:        Attention and Perception: Attention normal.        Mood and Affect: Mood normal.        Speech: Speech normal.        Behavior: Behavior normal.        Thought Content: Thought content normal.        Cognition and Memory: Cognition and memory normal.      Assessment:  Very pleasant 34 year old female with quite an interesting history.  EGD several months ago for dysphagia found abnormal second duodenum status post biopsy that found extranodal follicular  lymphoma.  She underwent weekly chemotherapy for 1 month and had a repeat EGD which showed improvement in the area but still persistent, confirmed by biopsy.  He has an appointment with oncology next week.  Follicular lymphoma in the duodenum: Improvement with status post chemotherapy.  Has hematology/oncology appointment next week, anticipate further recommendations from oncology.  Will likely need another EGD at some point in the future for surveillance.  At this point she is doing relatively well  Abdominal pain with rectal bleeding: 1 week ago she had a 1 day occurrence with 2 episodes of abdominal cramping and rectal bleeding.  She states no stool in the blood.  This has not recurred since.  No persistent abdominal pain, diarrhea, bleeding.  Query self-limiting enteritis.  Given her age less likely malignancy, although this is not a brown possibility.  Given her ongoing treatment for lymphoma I am hesitant to pull the trigger on colonoscopy today.  I will discuss with Dr. Gala Romney for his input.  She may eventually want to have a colonoscopy in the next 3 to 6 months.  This will be more imperative if she has recurrent symptoms.  We will have her follow-up in 2 months.  Decreased appetite/early satiety: This is been ongoing since her lymphoma diagnosis, swallowing difficulties, chemotherapy.  Does not appear to have persistent dysphagia symptoms.  She states she more just "finds eating boring at this point".  However, she is taking protein supplements to help maintain her nutrition and her weight is stable.  I recommended she continue supplements, continue mentality of "eat to live".  Call us for any noted weight loss.  We will continue to monitor as well.  Plan: 1. Notify us of any recurrent abdominal pain or rectal bleeding 2. Notify us of any significant weight loss 3. Continue current medications 4. Follow-up with oncology based on their recommendations 5. We will discuss with Dr. Gala Romney  recommendations for possible: Endoscopic evaluation 6. Follow-up in 2 months    Thank you for allowing [place] to participate in the care of [place], DNP, AGNP-C Adult & Gerontological Nurse Practitioner [facility]   05/24/2020 2:57 PM   Disclaimer: This note was dictated with voice recognition software. Similar sounding words can inadvertently be transcribed and may not be corrected upon review.

## 2020-05-28 ENCOUNTER — Inpatient Hospital Stay (HOSPITAL_BASED_OUTPATIENT_CLINIC_OR_DEPARTMENT_OTHER): Payer: BC Managed Care – PPO | Admitting: Hematology

## 2020-05-28 ENCOUNTER — Other Ambulatory Visit: Payer: Self-pay

## 2020-05-28 ENCOUNTER — Encounter (HOSPITAL_COMMUNITY): Payer: Self-pay | Admitting: Hematology

## 2020-05-28 VITALS — BP 126/80 | HR 68 | Temp 97.5°F | Resp 16 | Wt 197.4 lb

## 2020-05-28 DIAGNOSIS — C8209 Follicular lymphoma grade I, extranodal and solid organ sites: Secondary | ICD-10-CM | POA: Diagnosis not present

## 2020-05-28 DIAGNOSIS — E063 Autoimmune thyroiditis: Secondary | ICD-10-CM | POA: Diagnosis not present

## 2020-05-28 NOTE — Progress Notes (Signed)
[place] [place], [place] [address]   CLINIC:  Medical Oncology/Hematology  PCP:  Dettinger, Fransisca Kaufmann, MD [place] / [place] [address]  [phone]  REASON FOR VISIT:  Follow-up for low-grade follicular lymphoma of duodenum  PRIOR THERAPY: Weekly rituximab x 4 cycles from 10/27/2019 to 11/18/2019  CURRENT THERAPY: Observation  INTERVAL HISTORY:  [name], a 34 y.o. female, returns for routine follow-up for her low-grade follicular lymphoma of duodenum. Reid was last seen on 01/25/2020.  Today she reports feeling okay. She continues having sporadic diarrhea, though it is improving. She denies having F/C, night sweats, or unintentional weight loss. Her overall GI symptoms are improving.   REVIEW OF SYSTEMS:  Review of Systems  Constitutional: Positive for appetite change (75%) and fatigue (depleted). Negative for chills, diaphoresis, fever and unexpected weight change.  Respiratory: Positive for shortness of breath (w/ CP).   Cardiovascular: Positive for chest pain (d/t anxiety) and palpitations.  Gastrointestinal: Positive for blood in stool (rectal bleeding).  Neurological: Positive for numbness (R arm).  All other systems reviewed and are negative.   PAST MEDICAL/SURGICAL HISTORY:  Past Medical History:  Diagnosis Date  . Allergy   . Cancer ([place]) 26/3785   follicular lymphoma  . Dysphagia   . GERD (gastroesophageal reflux disease)   . Thyroid disease    Hypothyroidism  . Thyroid nodule 2020   Past Surgical History:  Procedure Laterality Date  . ABDOMINAL HYSTERECTOMY    . BIOPSY  05/09/2020   Procedure: BIOPSY;  Surgeon: Daneil Dolin, MD;  [facility];  Service: Endoscopy;;  duodenal  . ESOPHAGOGASTRODUODENOSCOPY N/A 07/13/2019   Procedure: ESOPHAGOGASTRODUODENOSCOPY (EGD);  Surgeon: Daneil Dolin, MD; normal esophagus s/p dilation and biopsied, normal stomach, patchy areas of whitish, nodular  mucosa in the second portion of the duodenum s/p biopsy.  Duodenal biopsy with low-grade follicular lymphoma.  Esophageal biopsies benign without increased intraepithelial eosinophils.  . ESOPHAGOGASTRODUODENOSCOPY N/A 05/09/2020   Procedure: ESOPHAGOGASTRODUODENOSCOPY (EGD);  Surgeon: Daneil Dolin, MD;  [facility];  Service: Endoscopy;  Laterality: N/A;  7:30pm  . KIDNEY STONE SURGERY    . MALONEY DILATION N/A 07/13/2019   Procedure: Venia Minks DILATION;  Surgeon: Daneil Dolin, MD;  [facility];  Service: Endoscopy;  Laterality: N/A;  . TONSILLECTOMY     Around age 16  . WISDOM TOOTH EXTRACTION      SOCIAL HISTORY:  Social History   Socioeconomic History  . Marital status: Married    Spouse name: Not on file  . Number of children: 2  . Years of education: Not on file  . Highest education level: Not on file  Occupational History    Employer: [employer]  Tobacco Use  . Smoking status: Never Smoker  . Smokeless tobacco: Never Used  Vaping Use  . Vaping Use: Never used  Substance and Sexual Activity  . Alcohol use: Yes    Comment: occasional- maybe once a month  . Drug use: No  . Sexual activity: Yes    Birth control/protection: Surgical    Comment: married 14 years  Other Topics Concern  . Not on file  Social History Narrative  . Not on file   Social Determinants of Health   Financial Resource Strain: Low Risk   . Difficulty of Paying Living Expenses: Not hard at all  Food Insecurity: No Food Insecurity  . Worried About Charity fundraiser in  the Last Year: Never true  . Ran Out of Food in the Last Year: Never true  Transportation Needs: No Transportation Needs  . Lack of Transportation (Medical): No  . Lack of Transportation (Non-Medical): No  Physical Activity: Insufficiently Active  . Days of Exercise per Week: 5 days  . Minutes of Exercise per Session: 20 min  Stress: Stress Concern Present  . Feeling of Stress : To some extent    Social Connections: Moderately Isolated  . Frequency of Communication with Friends and Family: More than three times a week  . Frequency of Social Gatherings with Friends and Family: More than three times a week  . Attends Religious Services: Never  . Active Member of Clubs or Organizations: No  . Attends Archivist Meetings: Never  . Marital Status: Married  Human resources officer Violence: Not At Risk  . Fear of Current or Ex-Partner: No  . Emotionally Abused: No  . Physically Abused: No  . Sexually Abused: No    FAMILY HISTORY:  Family History  Problem Relation Age of Onset  . Thrombocytopenia Mother        itp  . Heart disease Mother   . Hypertension Mother   . Seizures Brother   . ADD / ADHD Brother   . Colon cancer Paternal Grandfather        in 45s  . Diabetes Maternal Grandmother   . Stroke Maternal Grandmother   . Heart disease Maternal Grandfather   . Stroke Maternal Grandfather   . Hypothyroidism Paternal Grandmother   . Psoriasis Brother   . Asthma Daughter   . Kidney disease Maternal Uncle   . Non-Hodgkin's lymphoma Paternal Uncle   . Leukemia Paternal Great-grandmother   . Ovarian cancer Maternal Great-grandmother   . Breast cancer Other        maternal great aunt  . Colon polyps Neg Hx     CURRENT MEDICATIONS:  Current Outpatient Medications  Medication Sig Dispense Refill  . Ascorbic Acid (VITAMIN C PO) Take 1 tablet by mouth daily.    . Bacillus Coagulans-Inulin (PROBIOTIC-PREBIOTIC PO) Take by mouth daily.     . Cholecalciferol (VITAMIN D3) 50 MCG (2000 UT) TABS Take 2,000 Units by mouth daily.    Marland Kitchen ECHINACEA PO Take 760 mg by mouth at bedtime.    . fexofenadine (ALLEGRA) 180 MG tablet Take 180 mg by mouth daily as needed for allergies.     Marland Kitchen liothyronine (CYTOMEL) 5 MCG tablet Take 5 mcg by mouth daily.    Marland Kitchen MAGNESIUM GLUCONATE PO Take 240 mg by mouth at bedtime. 120 mg    . Multiple Vitamins-Minerals (MULTIVITAMIN ADULT PO) Take 1 tablet by  mouth daily.     . pantoprazole (PROTONIX) 40 MG tablet Take 1 tablet (40 mg total) by mouth 2 (two) times daily before a meal. (Patient taking differently: Take 40 mg by mouth as needed (reflux). ) 60 tablet 4  . PREBIOTIC PRODUCT PO Take 330 mg by mouth daily. enzymes    . prochlorperazine (COMPAZINE) 10 MG tablet Take 1 tablet (10 mg total) by mouth every 6 (six) hours as needed for nausea or vomiting. 30 tablet 2  . Selenium 200 MCG CAPS Take 200 mcg by mouth at bedtime.    Marland Kitchen spironolactone (ALDACTONE) 25 MG tablet Take 25 mg by mouth daily.    Marland Kitchen SYNTHROID 88 MCG tablet Take 88 mcg by mouth every morning.     . Zinc 23 MG LOZG Take 1 lozenge by  mouth daily.     No current facility-administered medications for this visit.    ALLERGIES:  Allergies  Allergen Reactions  . Latex Rash    PHYSICAL EXAM:  Performance status (ECOG): 0 - Asymptomatic  Vitals:   05/28/20 1611  BP: 126/80  Pulse: 68  Resp: 16  Temp: (!) 97.5 F (36.4 C)  SpO2: 100%   Wt Readings from Last 3 Encounters:  05/28/20 197 lb 6.4 oz (89.5 kg)  05/24/20 196 lb 3.2 oz (89 kg)  05/09/20 190 lb (86.2 kg)   Physical Exam Vitals reviewed.  Constitutional:      Appearance: Normal appearance.  Cardiovascular:     Rate and Rhythm: Normal rate and regular rhythm.     Pulses: Normal pulses.     Heart sounds: Normal heart sounds.  Pulmonary:     Effort: Pulmonary effort is normal.     Breath sounds: Normal breath sounds.  Abdominal:     Palpations: Abdomen is soft. There is no hepatomegaly, splenomegaly or mass.     Tenderness: There is abdominal tenderness in the left upper quadrant.     Hernia: No hernia is present.  Lymphadenopathy:     Upper Body:     Right upper body: No supraclavicular adenopathy.     Left upper body: No supraclavicular adenopathy.  Neurological:     General: No focal deficit present.     Mental Status: She is alert and oriented to person, place, and time.  Psychiatric:         Mood and Affect: Mood normal.        Behavior: Behavior normal.     LABORATORY DATA:  I have reviewed the labs as listed.  CBC Latest Ref Rng & Units 05/21/2020 05/16/2020 01/24/2020  WBC 4.0 - 10.5 K/uL 7.2 9.4 5.2  Hemoglobin 12.0 - 15.0 g/dL 13.3 13.6 12.3  Hematocrit 36 - 46 % 40.3 41.3 37.7  Platelets 150 - 400 K/uL 188 232 197   CMP Latest Ref Rng & Units 05/21/2020 05/16/2020 01/24/2020  Glucose 70 - 99 mg/dL 106(H) 95 94  BUN 6 - 20 mg/dL 12 15 9   Creatinine 0.44 - 1.00 mg/dL 0.64 0.58 0.64  Sodium 135 - 145 mmol/L 134(L) 138 139  Potassium 3.5 - 5.1 mmol/L 3.9 3.8 4.3  Chloride 98 - 111 mmol/L 106 104 108  CO2 22 - 32 mmol/L 20(L) 25 24  Calcium 8.9 - 10.3 mg/dL 9.2 9.7 8.8(L)  Total Protein 6.5 - 8.1 g/dL 7.4 7.8 7.0  Total Bilirubin 0.3 - 1.2 mg/dL 1.1 0.8 1.2  Alkaline Phos 38 - 126 U/L 58 63 62  AST 15 - 41 U/L 21 19 18   ALT 0 - 44 U/L 17 18 25       Component Value Date/Time   RBC 4.68 05/21/2020 0903   MCV 86.1 05/21/2020 0903   MCV 82 12/04/2017 1012   MCH 28.4 05/21/2020 0903   MCHC 33.0 05/21/2020 0903   RDW 12.5 05/21/2020 0903   RDW 13.3 12/04/2017 1012   LYMPHSABS 1.9 05/21/2020 0903   LYMPHSABS 2.3 12/04/2017 1012   MONOABS 0.6 05/21/2020 0903   EOSABS 0.2 05/21/2020 0903   EOSABS 0.2 12/04/2017 1012   BASOSABS 0.1 05/21/2020 0903   BASOSABS 0.0 12/04/2017 1012   Lab Results  Component Value Date   LDH 120 05/21/2020   LDH 113 05/16/2020   LDH 112 01/24/2020   Surgical pathology ([id number]) on 05/09/2020: Duodenal biopsy: low-grade follicular lymphoma.  DIAGNOSTIC IMAGING:  I have independently reviewed the scans and discussed with the patient. No results found.   ASSESSMENT:  1. Stage I AE duodenal follicular lymphoma: -EGD on 07/13/2019 shows patchy areas of whitish, nodular mucosa in second part of the duodenum, biopsy consistent with grade 0-3/5 follicular lymphoma. -PET scan on 07/25/2019-very mild hypermetabolic along the second  portion of the duodenum, maximum SUV 3.6. -No B symptoms. She lost some weight due to digestive problems. -Bone marrow biopsy on 08/02/2019 shows normocellular marrow with trilineage hematopoiesis. -She has acid reflux symptoms which improved mildly after starting gluten-free diet. Continues to have nausea and diarrhea. -Weekly rituximab from 10/27/2019 through 11/18/2019. -Presentation with diarrhea, diagnosed with Cyclospora Cayetanensis on 01/27/2020, treated with Bactrim. -EGD on 05/09/2020 showed normal stomach and esophagus.  Area of abnormality in the distal second portion of the duodenum, smaller area (approximately 60% smaller than area seen 1 year ago).  Remainder of the first, second and third portion of duodenum normal. -Duodenal biopsy consistent with follicular lymphoma.  2.  Hashimoto's thyroiditis: -She is on Synthroid.  3.  Family history: -Paternal uncle had DLBCL.  Paternal grandmother had leukemia.  Paternal grandfather had colon cancer. -Maternal great aunt had breast cancer.  Maternal great grandmother had ovarian cancer.  Mother had ITP. -Maternal grandmother is newly diagnosed with large B-cell lymphoma.   PLAN:  1. Stage I AE duodenal follicular lymphoma: -She does not have any fevers, night sweats or weight loss. -Labs reviewed showed normal LDH and LFTs.  CBC was normal. -RTC 6 months with repeat labs and physical exam. -We will do scans only if clinical condition dictates.   Orders placed this encounter:  No orders of the defined types were placed in this encounter.    Derek Jack, MD [place] [phone]   I, Milinda Antis, am acting as a scribe for Dr. Sanda Linger.  I, Derek Jack MD, have reviewed the above documentation for accuracy and completeness, and I agree with the above.

## 2020-05-28 NOTE — Patient Instructions (Signed)
[facility] at [facility] Discharge Instructions  You were seen today by Dr. Katragadda. He went over your recent results and scans. Dr. Katragadda will see you back in 6 months for labs and follow up.   Thank you for choosing [facility] at [facility] to provide your oncology and hematology care.  To afford each patient quality time with our provider, please arrive at least 15 minutes before your scheduled appointment time.   If you have a lab appointment with the Cancer Center please come in thru the Main Entrance and check in at the main information desk  You need to re-schedule your appointment should you arrive 10 or more minutes late.  We strive to give you quality time with our providers, and arriving late affects you and other patients whose appointments are after yours.  Also, if you no show three or more times for appointments you may be dismissed from the clinic at the providers discretion.     Again, thank you for choosing [facility].  Our hope is that these requests will decrease the amount of time that you wait before being seen by our physicians.       _____________________________________________________________  Should you have questions after your visit to  Cancer Center, please contact our office at [phone] between the hours of 8:00 a.m. and 4:30 p.m.  Voicemails left after 4:00 p.m. will not be returned until the following business day.  For prescription refill requests, have your pharmacy contact our office and allow 72 hours.    Cancer Center Support Programs:   > Cancer Support Group  2nd Tuesday of the month 1pm-2pm, Journey Room   

## 2020-06-01 NOTE — Progress Notes (Signed)
Cc'ed to pcp °

## 2020-06-22 DIAGNOSIS — Z20822 Contact with and (suspected) exposure to covid-19: Secondary | ICD-10-CM | POA: Diagnosis not present

## 2020-07-11 DIAGNOSIS — E063 Autoimmune thyroiditis: Secondary | ICD-10-CM | POA: Diagnosis not present

## 2020-07-11 DIAGNOSIS — E038 Other specified hypothyroidism: Secondary | ICD-10-CM | POA: Diagnosis not present

## 2020-07-16 DIAGNOSIS — E063 Autoimmune thyroiditis: Secondary | ICD-10-CM | POA: Diagnosis not present

## 2020-07-16 DIAGNOSIS — E038 Other specified hypothyroidism: Secondary | ICD-10-CM | POA: Diagnosis not present

## 2020-07-25 ENCOUNTER — Ambulatory Visit: Payer: BC Managed Care – PPO | Admitting: Nurse Practitioner

## 2020-09-24 ENCOUNTER — Other Ambulatory Visit: Payer: Self-pay | Admitting: Gastroenterology

## 2020-09-24 DIAGNOSIS — R1310 Dysphagia, unspecified: Secondary | ICD-10-CM

## 2020-09-24 DIAGNOSIS — K219 Gastro-esophageal reflux disease without esophagitis: Secondary | ICD-10-CM

## 2020-11-19 ENCOUNTER — Other Ambulatory Visit: Payer: Self-pay

## 2020-11-19 ENCOUNTER — Inpatient Hospital Stay (HOSPITAL_COMMUNITY): Payer: BC Managed Care – PPO | Attending: Hematology

## 2020-11-19 DIAGNOSIS — Z8572 Personal history of non-Hodgkin lymphomas: Secondary | ICD-10-CM | POA: Diagnosis not present

## 2020-11-19 DIAGNOSIS — C8209 Follicular lymphoma grade I, extranodal and solid organ sites: Secondary | ICD-10-CM

## 2020-11-19 LAB — CBC WITH DIFFERENTIAL/PLATELET
Abs Immature Granulocytes: 0.02 10*3/uL (ref 0.00–0.07)
Basophils Absolute: 0.1 10*3/uL (ref 0.0–0.1)
Basophils Relative: 1 %
Eosinophils Absolute: 0.2 10*3/uL (ref 0.0–0.5)
Eosinophils Relative: 3 %
HCT: 38.8 % (ref 36.0–46.0)
Hemoglobin: 13 g/dL (ref 12.0–15.0)
Immature Granulocytes: 0 %
Lymphocytes Relative: 35 %
Lymphs Abs: 2.4 10*3/uL (ref 0.7–4.0)
MCH: 28.8 pg (ref 26.0–34.0)
MCHC: 33.5 g/dL (ref 30.0–36.0)
MCV: 86 fL (ref 80.0–100.0)
Monocytes Absolute: 0.5 10*3/uL (ref 0.1–1.0)
Monocytes Relative: 7 %
Neutro Abs: 3.7 10*3/uL (ref 1.7–7.7)
Neutrophils Relative %: 54 %
Platelets: 189 10*3/uL (ref 150–400)
RBC: 4.51 MIL/uL (ref 3.87–5.11)
RDW: 12.4 % (ref 11.5–15.5)
WBC: 6.9 10*3/uL (ref 4.0–10.5)
nRBC: 0 % (ref 0.0–0.2)

## 2020-11-19 LAB — COMPREHENSIVE METABOLIC PANEL
ALT: 16 U/L (ref 0–44)
AST: 22 U/L (ref 15–41)
Albumin: 4.3 g/dL (ref 3.5–5.0)
Alkaline Phosphatase: 57 U/L (ref 38–126)
Anion gap: 6 (ref 5–15)
BUN: 16 mg/dL (ref 6–20)
CO2: 26 mmol/L (ref 22–32)
Calcium: 9 mg/dL (ref 8.9–10.3)
Chloride: 106 mmol/L (ref 98–111)
Creatinine, Ser: 0.66 mg/dL (ref 0.44–1.00)
GFR, Estimated: >60 mL/min (ref >60)
Glucose, Bld: 99 mg/dL (ref 70–99)
Potassium: 3.7 mmol/L (ref 3.5–5.1)
Sodium: 138 mmol/L (ref 135–145)
Total Bilirubin: 1.3 mg/dL — ABNORMAL HIGH (ref 0.3–1.2)
Total Protein: 7.3 g/dL (ref 6.5–8.1)

## 2020-11-19 LAB — LACTATE DEHYDROGENASE: LDH: 114 U/L (ref 98–192)

## 2020-11-19 LAB — URIC ACID: Uric Acid, Serum: 5.5 mg/dL (ref 2.5–7.1)

## 2020-11-26 ENCOUNTER — Other Ambulatory Visit: Payer: Self-pay

## 2020-11-26 ENCOUNTER — Inpatient Hospital Stay (HOSPITAL_BASED_OUTPATIENT_CLINIC_OR_DEPARTMENT_OTHER): Payer: BC Managed Care – PPO | Admitting: Hematology

## 2020-11-26 DIAGNOSIS — C8209 Follicular lymphoma grade I, extranodal and solid organ sites: Secondary | ICD-10-CM

## 2020-11-26 NOTE — Progress Notes (Signed)
Virtual Visit via Telephone Note  I connected with Betty Hayes on 11/26/20 at  4:00 PM EDT by telephone and verified that I am speaking with the correct person using two identifiers.  Location: Patient: At home Provider: In the office   I discussed the limitations, risks, security and privacy concerns of performing an evaluation and management service by telephone and the availability of in person appointments. I also discussed with the patient that there may be a patient responsible charge related to this service. The patient expressed understanding and agreed to proceed.   History of Present Illness: She is seen for follow-up of stage I duodenal follicular lymphoma.  She was treated with weekly rituximab from 10/27/2019 through 11/18/2019.   Observations/Objective: She reports having "stomach bug" and has some vomiting and diarrhea.  She is also experiencing some chest tightness since the stomach bug started.  Otherwise she reports appetite and energy levels as 100%.  Denies any fevers, night sweats or weight loss in the last 6 months.  She takes occasional Protonix and has episode of diarrhea once or twice per month along with acid reflux.  Assessment and Plan:  1.  Stage I AE duodenal follicular lymphoma: - Does not report any B symptoms. - We have reviewed her labs which showed normal CBC.  LDH was normal.  Uric acid and LFTs were normal.  Total bilirubin was 1.3. - RTC 6 months with repeat labs and physical exam. - She was told to come back sooner should she develop any fevers, night sweats or weight loss or obstructive symptoms.   Follow Up Instructions: RTC 6 months for follow-up with labs.   I discussed the assessment and treatment plan with the patient. The patient was provided an opportunity to ask questions and all were answered. The patient agreed with the plan and demonstrated an understanding of the instructions.   The patient was advised to call back or seek an  in-person evaluation if the symptoms worsen or if the condition fails to improve as anticipated.  I provided 11 minutes of non-face-to-face time during this encounter.   Derek Jack, MD

## 2020-11-27 ENCOUNTER — Ambulatory Visit (INDEPENDENT_AMBULATORY_CARE_PROVIDER_SITE_OTHER): Payer: BC Managed Care – PPO | Admitting: Nurse Practitioner

## 2020-11-27 ENCOUNTER — Encounter: Payer: Self-pay | Admitting: Nurse Practitioner

## 2020-11-27 DIAGNOSIS — J020 Streptococcal pharyngitis: Secondary | ICD-10-CM | POA: Diagnosis not present

## 2020-11-27 MED ORDER — AMOXICILLIN 875 MG PO TABS: 875.0000 mg | ORAL_TABLET | Freq: Two times a day (BID) | ORAL | 0 refills | Status: DC

## 2020-11-27 NOTE — Patient Instructions (Signed)
Force fluids °Motrin or tylenol OTC °OTC decongestant °Throat lozenges if help °New toothbrush in 3 days ° °

## 2020-11-27 NOTE — Progress Notes (Signed)
   Virtual Visit  Note Due to COVID-19 pandemic this visit was conducted virtually. This visit type was conducted due to national recommendations for restrictions regarding the COVID-19 Pandemic (e.g. social distancing, sheltering in place) in an effort to limit this patient's exposure and mitigate transmission in our community. All issues noted in this document were discussed and addressed.  A physical exam was not performed with this format.  I connected with Betty Hayes on 11/27/20 at 9:32 by telephone and verified that I am speaking with the correct person using two identifiers. Betty Hayes is currently located at home and no one  is currently with her during visit. The provider, Mary-Margaret Hassell Done, FNP is located in their office at time of visit.  I discussed the limitations, risks, security and privacy concerns of performing an evaluation and management service by telephone and the availability of in person appointments. I also discussed with the patient that there may be a patient responsible charge related to this service. The patient expressed understanding and agreed to proceed.   History and Present Illness:   Chief Complaint: Sinusitis   HPI Patient calls in for telephone visit starting she has sore thorat with white patches on it. Has had temperature of 102 since yesterday. Neck hurts. She is taking motrin and tylenol. Hurts to talk or swallow. She  Has had the covid vaccines and hac not been around anyone that has been sick.  Review of Systems  Constitutional: Positive for chills, fever and malaise/fatigue.  HENT: Positive for sore throat. Negative for congestion and sinus pain.   Respiratory: Negative for cough, sputum production and shortness of breath.   Musculoskeletal: Negative for myalgias.  Neurological: Positive for headaches. Negative for dizziness.     Observations/Objective: Alert and oriented- answers all questions appropriately No distress Throat  sounds froggy No cough noted  Assessment and Plan: Betty Hayes in today with chief complaint of Sinusitis   1. Pharyngitis due to Streptococcus species Force fluids Motrin or tylenol OTC OTC decongestant Throat lozenges if help New toothbrush in 3 days Meds ordered this encounter  Medications  . amoxicillin (AMOXIL) 875 MG tablet    Sig: Take 1 tablet (875 mg total) by mouth 2 (two) times daily. 1 po BID    Dispense:  10 tablet    Refill:  0    Order Specific Question:   Supervising Provider    Answer:   Caryl Pina A [[id number]]         Follow Up Instructions: prn    I discussed the assessment and treatment plan with the patient. The patient was provided an opportunity to ask questions and all were answered. The patient agreed with the plan and demonstrated an understanding of the instructions.   The patient was advised to call back or seek an in-person evaluation if the symptoms worsen or if the condition fails to improve as anticipated.  The above assessment and management plan was discussed with the patient. The patient verbalized understanding of and has agreed to the management plan. Patient is aware to call the clinic if symptoms persist or worsen. Patient is aware when to return to the clinic for a follow-up visit. Patient educated on when it is appropriate to go to the emergency department.   Time call ended:  10:44  I provided 12 minutes of  non face-to-face time during this encounter.    Mary-Margaret Hassell Done, FNP

## 2021-01-09 DIAGNOSIS — E042 Nontoxic multinodular goiter: Secondary | ICD-10-CM | POA: Diagnosis not present

## 2021-01-09 DIAGNOSIS — E038 Other specified hypothyroidism: Secondary | ICD-10-CM | POA: Diagnosis not present

## 2021-01-09 DIAGNOSIS — E063 Autoimmune thyroiditis: Secondary | ICD-10-CM | POA: Diagnosis not present

## 2021-01-09 DIAGNOSIS — E559 Vitamin D deficiency, unspecified: Secondary | ICD-10-CM | POA: Diagnosis not present

## 2021-05-27 ENCOUNTER — Inpatient Hospital Stay (HOSPITAL_COMMUNITY): Payer: BC Managed Care – PPO | Attending: Hematology

## 2021-05-30 ENCOUNTER — Other Ambulatory Visit: Payer: Self-pay

## 2021-05-30 DIAGNOSIS — E039 Hypothyroidism, unspecified: Secondary | ICD-10-CM | POA: Diagnosis not present

## 2021-05-31 LAB — TSH: TSH: 2.13 u[IU]/mL (ref 0.450–4.500)

## 2021-05-31 LAB — T3, FREE: T3, Free: 3.2 pg/mL (ref 2.0–4.4)

## 2021-05-31 LAB — T4, FREE: Free T4: 0.96 ng/dL (ref 0.82–1.77)

## 2021-06-03 ENCOUNTER — Ambulatory Visit (HOSPITAL_COMMUNITY): Payer: BC Managed Care – PPO | Admitting: Hematology

## 2021-07-17 ENCOUNTER — Inpatient Hospital Stay (HOSPITAL_COMMUNITY): Payer: BC Managed Care – PPO | Attending: Hematology

## 2021-07-17 ENCOUNTER — Other Ambulatory Visit: Payer: Self-pay

## 2021-07-17 DIAGNOSIS — C8209 Follicular lymphoma grade I, extranodal and solid organ sites: Secondary | ICD-10-CM | POA: Diagnosis not present

## 2021-07-17 LAB — CBC WITH DIFFERENTIAL/PLATELET
Abs Immature Granulocytes: 0.02 10*3/uL (ref 0.00–0.07)
Basophils Absolute: 0.1 10*3/uL (ref 0.0–0.1)
Basophils Relative: 1 %
Eosinophils Absolute: 0.2 10*3/uL (ref 0.0–0.5)
Eosinophils Relative: 3 %
HCT: 39.7 % (ref 36.0–46.0)
Hemoglobin: 13.1 g/dL (ref 12.0–15.0)
Immature Granulocytes: 0 %
Lymphocytes Relative: 34 %
Lymphs Abs: 2 10*3/uL (ref 0.7–4.0)
MCH: 27.9 pg (ref 26.0–34.0)
MCHC: 33 g/dL (ref 30.0–36.0)
MCV: 84.6 fL (ref 80.0–100.0)
Monocytes Absolute: 0.5 10*3/uL (ref 0.1–1.0)
Monocytes Relative: 9 %
Neutro Abs: 3.1 10*3/uL (ref 1.7–7.7)
Neutrophils Relative %: 53 %
Platelets: 188 10*3/uL (ref 150–400)
RBC: 4.69 MIL/uL (ref 3.87–5.11)
RDW: 12.7 % (ref 11.5–15.5)
WBC: 5.9 10*3/uL (ref 4.0–10.5)
nRBC: 0 % (ref 0.0–0.2)

## 2021-07-17 LAB — COMPREHENSIVE METABOLIC PANEL
ALT: 20 U/L (ref 0–44)
AST: 24 U/L (ref 15–41)
Albumin: 4.1 g/dL (ref 3.5–5.0)
Alkaline Phosphatase: 57 U/L (ref 38–126)
Anion gap: 6 (ref 5–15)
BUN: 13 mg/dL (ref 6–20)
CO2: 24 mmol/L (ref 22–32)
Calcium: 9.1 mg/dL (ref 8.9–10.3)
Chloride: 108 mmol/L (ref 98–111)
Creatinine, Ser: 0.73 mg/dL (ref 0.44–1.00)
GFR, Estimated: >60 mL/min (ref >60)
Glucose, Bld: 99 mg/dL (ref 70–99)
Potassium: 4.3 mmol/L (ref 3.5–5.1)
Sodium: 138 mmol/L (ref 135–145)
Total Bilirubin: 0.9 mg/dL (ref 0.3–1.2)
Total Protein: 7.5 g/dL (ref 6.5–8.1)

## 2021-07-17 LAB — LACTATE DEHYDROGENASE: LDH: 115 U/L (ref 98–192)

## 2021-07-23 NOTE — Progress Notes (Signed)
[place], [place] [address]   CLINIC:  Medical Oncology/Hematology  PCP:  Dettinger, Fransisca Kaufmann, MD [place] / [place] [address] [phone]   REASON FOR VISIT:  Follow-up for low-grade follicular lymphoma of duodenum  PRIOR THERAPY: Weekly rituximab x 4 cycles from 10/27/2019 to 11/18/2019  CURRENT THERAPY: surveillance  BRIEF ONCOLOGIC HISTORY:  Oncology History  Follicular lymphoma ([place])  07/28/2019 Initial Diagnosis   Follicular lymphoma ([place])   09/29/2019 Cancer Staging   Staging form: Hodgkin and Non-Hodgkin Lymphoma, AJCC 8th Edition - Clinical stage from 7/49/4496: Stage IE (Follicular lymphoma) - Signed by Derek Jack, MD on 7/59/1638    Grade I follicular lymphoma of extranodal site excluding spleen and other solid organs ([facility])  09/29/2019 Initial Diagnosis   Grade I follicular lymphoma of extranodal site excluding spleen and other solid organs ([place])   10/27/2019 -  Chemotherapy   The patient had ondansetron (ZOFRAN) injection 8 mg, 8 mg (100 % of original dose 8 mg), Intravenous,  Once, 3 of 3 cycles Dose modification: 8 mg (original dose 8 mg, Cycle 2) ondansetron (ZOFRAN) 8 mg in sodium chloride 0.9 % 50 mL IVPB, , Intravenous,  Once, 3 of 3 cycles Administration: 8 mg (11/04/2019), 8 mg (11/11/2019), 8 mg (11/18/2019) riTUXimab-pvvr (RUXIENCE) 800 mg in sodium chloride 0.9 % 250 mL (2.4242 mg/mL) infusion, 375 mg/m2 = 800 mg, Intravenous,  Once, 4 of 4 cycles Administration: 800 mg (10/27/2019), 800 mg (11/04/2019), 800 mg (11/11/2019), 800 mg (11/18/2019)   for chemotherapy treatment.       CANCER STAGING:  Cancer Staging  Follicular lymphoma ([place]) Staging form: Hodgkin and Non-Hodgkin Lymphoma, AJCC 8th Edition - Clinical stage from 4/66/5993: Stage IE (Follicular lymphoma) - Signed by Derek Jack, MD on 09/29/2019   INTERVAL HISTORY:  Betty Hayes, a 36 y.o. female, returns for routine  follow-up of her low-grade follicular lymphoma of duodenum. Betty Hayes was last seen on 11/26/2020.   Today she reports feeling good. She reports occasional acid reflux for which she takes Protonix prn, about 2 times weekly. She also reports occasional mild diarrhea. She denies fevers and weight loss, but she reports occasional night sweats. She denies recent infections and skin rash.   REVIEW OF SYSTEMS:  Review of Systems  Constitutional:  Negative for appetite change, fatigue (60%), fever and unexpected weight change.  Gastrointestinal:  Positive for diarrhea (occasional).  Skin:  Negative for rash.  Neurological:  Positive for numbness.  Psychiatric/Behavioral:  Positive for sleep disturbance.   All other systems reviewed and are negative.  PAST MEDICAL/SURGICAL HISTORY:  Past Medical History:  Diagnosis Date   Allergy    Cancer ([place]) 57/0177   follicular lymphoma   Dysphagia    GERD (gastroesophageal reflux disease)    Thyroid disease    Hypothyroidism   Thyroid nodule 2020   Past Surgical History:  Procedure Laterality Date   ABDOMINAL HYSTERECTOMY     BIOPSY  05/09/2020   Procedure: BIOPSY;  Surgeon: Daneil Dolin, MD;  [facility];  Service: Endoscopy;;  duodenal   ESOPHAGOGASTRODUODENOSCOPY N/A 07/13/2019   Procedure: ESOPHAGOGASTRODUODENOSCOPY (EGD);  Surgeon: Daneil Dolin, MD; normal esophagus s/p dilation and biopsied, normal stomach, patchy areas of whitish, nodular mucosa in the second portion of the duodenum s/p biopsy.  Duodenal biopsy with low-grade follicular lymphoma.  Esophageal biopsies benign without increased intraepithelial eosinophils.   ESOPHAGOGASTRODUODENOSCOPY N/A 05/09/2020   Procedure: ESOPHAGOGASTRODUODENOSCOPY (EGD);  Surgeon:  Rourk, Cristopher Estimable, MD;  Location: [facility];  Service: Endoscopy;  Laterality: N/A;  7:30pm   KIDNEY STONE SURGERY     MALONEY DILATION N/A 07/13/2019   Procedure: MALONEY DILATION;  Surgeon: Daneil Dolin,  MD;  Location: [facility];  Service: Endoscopy;  Laterality: N/A;   TONSILLECTOMY     Around age 74   WISDOM TOOTH EXTRACTION      SOCIAL HISTORY:  Social History   Socioeconomic History   Marital status: Married    Spouse name: Not on file   Number of children: 2   Years of education: Not on file   Highest education level: Not on file  Occupational History    Employer: [employer]  Tobacco Use   Smoking status: Never   Smokeless tobacco: Never  Vaping Use   Vaping Use: Never used  Substance and Sexual Activity   Alcohol use: Yes    Comment: occasional- maybe once a month   Drug use: No   Sexual activity: Yes    Birth control/protection: Surgical    Comment: married 14 years  Other Topics Concern   Not on file  Social History Narrative   Not on file   Social Determinants of Health   Financial Resource Strain: Not on file  Food Insecurity: Not on file  Transportation Needs: Not on file  Physical Activity: Not on file  Stress: Not on file  Social Connections: Not on file  Intimate Partner Violence: Not on file    FAMILY HISTORY:  Family History  Problem Relation Age of Onset   Thrombocytopenia Mother        itp   Heart disease Mother    Hypertension Mother    Seizures Brother    ADD / ADHD Brother    Colon cancer Paternal Grandfather        in 59s   Diabetes Maternal Grandmother    Stroke Maternal Grandmother    Heart disease Maternal Grandfather    Stroke Maternal Grandfather    Hypothyroidism Paternal Grandmother    Psoriasis Brother    Asthma Daughter    Kidney disease Maternal Uncle    Non-Hodgkin's lymphoma Paternal Uncle    Leukemia Paternal Great-grandmother    Ovarian cancer Maternal Great-grandmother    Breast cancer Other        maternal great aunt   Colon polyps Neg Hx     CURRENT MEDICATIONS:  Current Outpatient Medications  Medication Sig Dispense Refill   Ascorbic Acid (VITAMIN C PO) Take 1 tablet by mouth daily.      Bacillus Coagulans-Inulin (PROBIOTIC-PREBIOTIC PO) Take by mouth daily.      Cholecalciferol (VITAMIN D3) 50 MCG (2000 UT) TABS Take 2,000 Units by mouth daily.     ECHINACEA PO Take 760 mg by mouth at bedtime.     fexofenadine (ALLEGRA) 180 MG tablet Take 180 mg by mouth daily as needed for allergies.      liothyronine (CYTOMEL) 5 MCG tablet Take 5 mcg by mouth daily.     MAGNESIUM GLUCONATE PO Take 240 mg by mouth at bedtime. 120 mg     Multiple Vitamins-Minerals (MULTIVITAMIN ADULT PO) Take 1 tablet by mouth daily.      pantoprazole (PROTONIX) 40 MG tablet TAKE 1 TABLET (40 MG TOTAL) BY MOUTH 2 (TWO) TIMES DAILY BEFORE A MEAL. 60 tablet 4   PREBIOTIC PRODUCT PO Take 330 mg by mouth daily. enzymes     Selenium 200 MCG CAPS Take 200  mcg by mouth at bedtime.     SYNTHROID 100 MCG tablet Take 100 mcg by mouth daily.     Zinc 23 MG LOZG Take 1 lozenge by mouth daily.     prochlorperazine (COMPAZINE) 10 MG tablet Take 1 tablet (10 mg total) by mouth every 6 (six) hours as needed for nausea or vomiting. (Patient not taking: Reported on 07/24/2021) 30 tablet 2   No current facility-administered medications for this visit.    ALLERGIES:  Allergies  Allergen Reactions   Latex Rash    PHYSICAL EXAM:  Performance status (ECOG): 0 - Asymptomatic  Vitals:   07/24/21 0809  BP: 110/70  Pulse: 70  Resp: 18  Temp: (!) 97.1 F (36.2 C)  SpO2: 97%   Wt Readings from Last 3 Encounters:  07/24/21 187 lb 12.8 oz (85.2 kg)  05/28/20 197 lb 6.4 oz (89.5 kg)  05/24/20 196 lb 3.2 oz (89 kg)   Physical Exam Vitals reviewed.  Constitutional:      Appearance: Normal appearance.  Cardiovascular:     Rate and Rhythm: Normal rate and regular rhythm.     Pulses: Normal pulses.     Heart sounds: Normal heart sounds.  Pulmonary:     Effort: Pulmonary effort is normal.     Breath sounds: Normal breath sounds.  Abdominal:     Palpations: Abdomen is soft. There is no hepatomegaly, splenomegaly or mass.      Tenderness: There is no abdominal tenderness.  Lymphadenopathy:     Cervical: No cervical adenopathy.     Right cervical: No superficial, deep or posterior cervical adenopathy.    Left cervical: No superficial, deep or posterior cervical adenopathy.     Upper Body:     Right upper body: No supraclavicular, axillary or pectoral adenopathy.     Left upper body: No supraclavicular, axillary or pectoral adenopathy.     Lower Body: No right inguinal adenopathy. No left inguinal adenopathy.  Neurological:     General: No focal deficit present.     Mental Status: She is alert and oriented to person, place, and time.  Psychiatric:        Mood and Affect: Mood normal.        Behavior: Behavior normal.     LABORATORY DATA:  I have reviewed the labs as listed.  CBC Latest Ref Rng & Units 07/17/2021 11/19/2020 05/21/2020  WBC 4.0 - 10.5 K/uL 5.9 6.9 7.2  Hemoglobin 12.0 - 15.0 g/dL 13.1 13.0 13.3  Hematocrit 36.0 - 46.0 % 39.7 38.8 40.3  Platelets 150 - 400 K/uL 188 189 188   CMP Latest Ref Rng & Units 07/17/2021 11/19/2020 05/21/2020  Glucose 70 - 99 mg/dL 99 99 106(H)  BUN 6 - 20 mg/dL '13 16 12  ' Creatinine 0.44 - 1.00 mg/dL 0.73 0.66 0.64  Sodium 135 - 145 mmol/L 138 138 134(L)  Potassium 3.5 - 5.1 mmol/L 4.3 3.7 3.9  Chloride 98 - 111 mmol/L 108 106 106  CO2 22 - 32 mmol/L 24 26 20(L)  Calcium 8.9 - 10.3 mg/dL 9.1 9.0 9.2  Total Protein 6.5 - 8.1 g/dL 7.5 7.3 7.4  Total Bilirubin 0.3 - 1.2 mg/dL 0.9 1.3(H) 1.1  Alkaline Phos 38 - 126 U/L 57 57 58  AST 15 - 41 U/L '24 22 21  ' ALT 0 - 44 U/L '20 16 17    ' DIAGNOSTIC IMAGING:  I have independently reviewed the scans and discussed with the patient. No results found.   ASSESSMENT:  1.  Stage I AE duodenal follicular lymphoma: -EGD on 07/13/2019 shows patchy areas of whitish, nodular mucosa in second part of the duodenum, biopsy consistent with grade 7-3/7 follicular lymphoma. -PET scan on 07/25/2019-very mild hypermetabolic along the  second portion of the duodenum, maximum SUV 3.6. -No B symptoms.  She lost some weight due to digestive problems. -Bone marrow biopsy on 08/02/2019 shows normocellular marrow with trilineage hematopoiesis. -She has acid reflux symptoms which improved mildly after starting gluten-free diet.  Continues to have nausea and diarrhea. -Weekly rituximab from 10/27/2019 through 11/18/2019. -Presentation with diarrhea, diagnosed with Cyclospora Cayetanensis on 01/27/2020, treated with Bactrim. -EGD on 05/09/2020 showed normal stomach and esophagus.  Area of abnormality in the distal second portion of the duodenum, smaller area (approximately 60% smaller than area seen 1 year ago).  Remainder of the first, second and third portion of duodenum normal. -Duodenal biopsy consistent with follicular lymphoma.   2.  Hashimoto's thyroiditis: -She is on Synthroid.   3.  Family history: -Paternal uncle had DLBCL.  Paternal grandmother had leukemia.  Paternal grandfather had colon cancer. -Maternal great aunt had breast cancer.  Maternal great grandmother had ovarian cancer.  Mother had ITP. -Maternal grandmother is newly diagnosed with large B-cell lymphoma.   PLAN:  1.  Stage I AE duodenal follicular lymphoma: - She does not have any fevers, night sweats or weight loss. - She is continuing to take Protonix for occasional acid reflux. - She has occasional diarrhea which is also stable. - Physical examination today did not reveal any palpable adenopathy or splenomegaly. - Reviewed labs from 07/17/2021 which showed normal LFTs, LDH and CBC. - RTC 1 year for follow-up with physical exam and labs.   Orders placed this encounter:  No orders of the defined types were placed in this encounter.    Derek Jack, MD [facility] [phone]   I, Thana Ates, am acting as a scribe for Dr. Derek Jack.  I, Derek Jack MD, have reviewed the above documentation for accuracy and  completeness, and I agree with the above.

## 2021-07-24 ENCOUNTER — Other Ambulatory Visit: Payer: Self-pay

## 2021-07-24 ENCOUNTER — Inpatient Hospital Stay (HOSPITAL_COMMUNITY): Payer: BC Managed Care – PPO | Attending: Hematology | Admitting: Hematology

## 2021-07-24 VITALS — BP 110/70 | HR 70 | Temp 97.1°F | Resp 18 | Wt 187.8 lb

## 2021-07-24 DIAGNOSIS — C8209 Follicular lymphoma grade I, extranodal and solid organ sites: Secondary | ICD-10-CM | POA: Diagnosis not present

## 2021-07-24 DIAGNOSIS — E559 Vitamin D deficiency, unspecified: Secondary | ICD-10-CM | POA: Diagnosis not present

## 2021-07-24 DIAGNOSIS — E063 Autoimmune thyroiditis: Secondary | ICD-10-CM | POA: Diagnosis not present

## 2021-07-24 DIAGNOSIS — E038 Other specified hypothyroidism: Secondary | ICD-10-CM | POA: Diagnosis not present

## 2021-07-24 DIAGNOSIS — R5383 Other fatigue: Secondary | ICD-10-CM | POA: Diagnosis not present

## 2021-07-24 DIAGNOSIS — R635 Abnormal weight gain: Secondary | ICD-10-CM | POA: Diagnosis not present

## 2021-07-24 DIAGNOSIS — Z79890 Hormone replacement therapy (postmenopausal): Secondary | ICD-10-CM | POA: Diagnosis not present

## 2021-07-24 DIAGNOSIS — L678 Other hair color and hair shaft abnormalities: Secondary | ICD-10-CM | POA: Diagnosis not present

## 2021-07-24 NOTE — Patient Instructions (Addendum)
[facility] at [facility] Discharge Instructions  You were seen and examined today by Dr. Delton Coombes. Dr. Delton Coombes reviewed your recent lab work, which was all normal!  Follow-up as scheduled.   Thank you for choosing [place] at [facility] to provide your oncology and hematology care.  To afford each patient quality time with our provider, please arrive at least 15 minutes before your scheduled appointment time.   If you have a lab appointment with the [facility] please come in thru the Main Entrance and check in at the main information desk.  You need to re-schedule your appointment should you arrive 10 or more minutes late.  We strive to give you quality time with our providers, and arriving late affects you and other patients whose appointments are after yours.  Also, if you no show three or more times for appointments you may be dismissed from the clinic at the providers discretion.     Again, thank you for choosing [facility].  Our hope is that these requests will decrease the amount of time that you wait before being seen by our physicians.       _____________________________________________________________  Should you have questions after your visit to [facility], please contact our office at [phone] and follow the prompts.  Our office hours are 8:00 a.m. and 4:30 p.m. Monday - Friday.  Please note that voicemails left after 4:00 p.m. may not be returned until the following business day.  We are closed weekends and major holidays.  You do have access to a nurse 24-7, just call the main number to the clinic [phone] and do not press any options, hold on the line and a nurse will answer the phone.    For prescription refill requests, have your pharmacy contact our office and allow 72 hours.    Due to Covid, you will need to wear a mask upon entering the hospital. If you do not have a mask, a mask  will be given to you at the Main Entrance upon arrival. For doctor visits, patients may have 1 support person age 48 or older with them. For treatment visits, patients can not have anyone with them due to social distancing guidelines and our immunocompromised population.

## 2021-09-01 IMAGING — PT NM PET TUM IMG INITIAL (PI) SKULL BASE T - THIGH
3 series · 23 of 25 positions shown · non-contrast
Comparison: None.

CLINICAL DATA: Initial treatment strategy for small-bowel
follicular lymphoma.

EXAM:
NUCLEAR MEDICINE PET SKULL BASE TO THIGH
TECHNIQUE: 10.7 mCi F-18 FDG was injected intravenously. Full-ring PET imaging
was performed from the skull base to thigh after the radiotracer. CT
data was obtained and used for attenuation correction and anatomic
localization.
Fasting blood glucose: 97 mg/dl

[axial ct wb fusion · 16 of 176 slices shown]
[im 1/176]
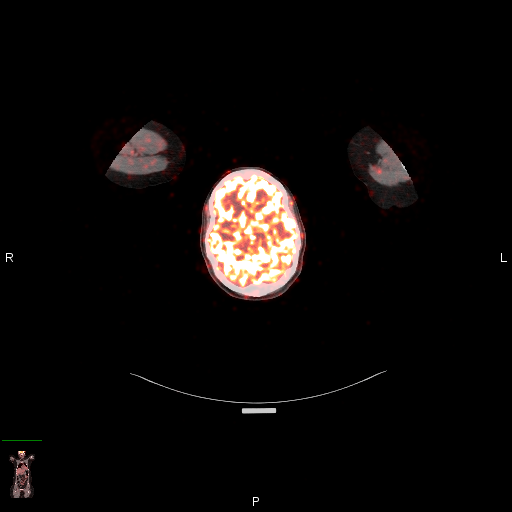
[im 11/176]
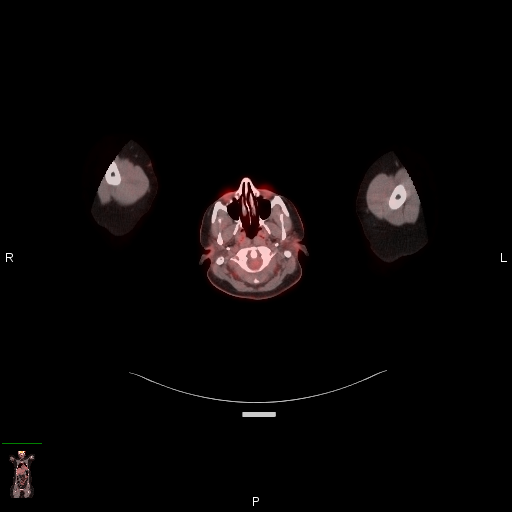
[im 22/176]
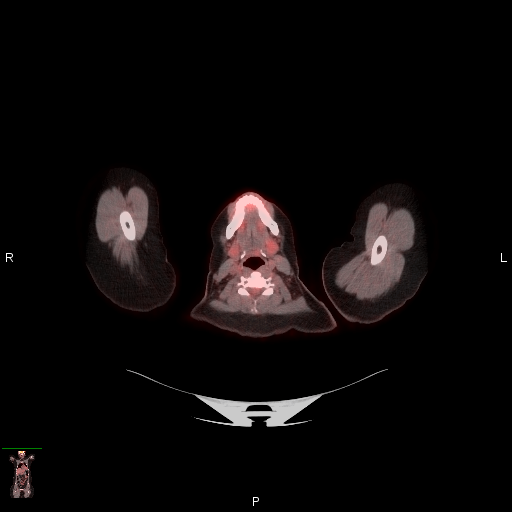
[im 33/176]
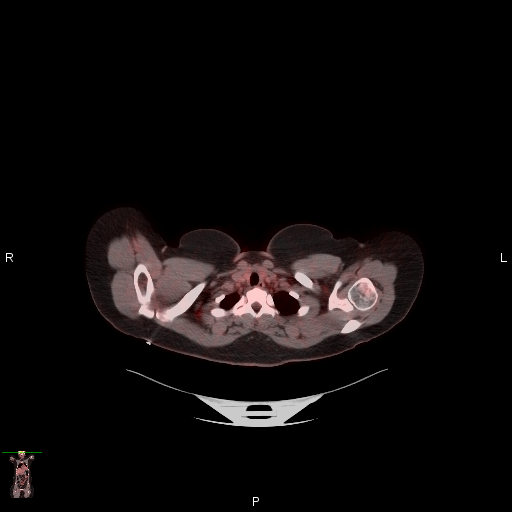
[im 44/176]
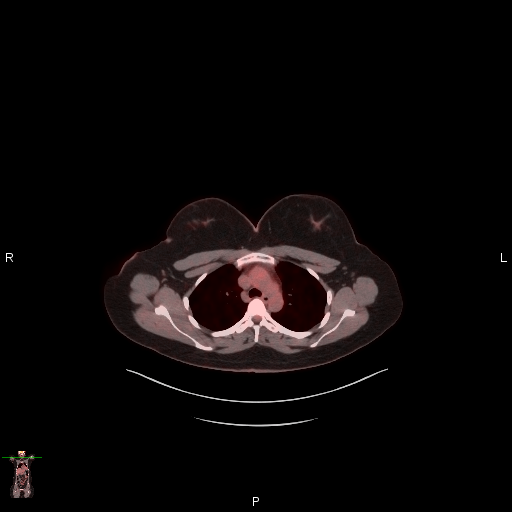
[im 55/176]
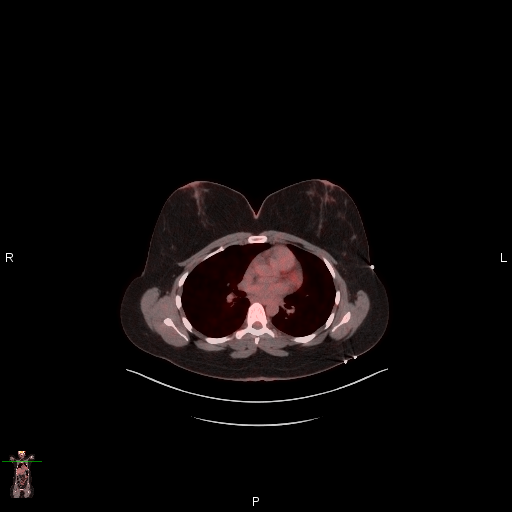
[im 77/176]
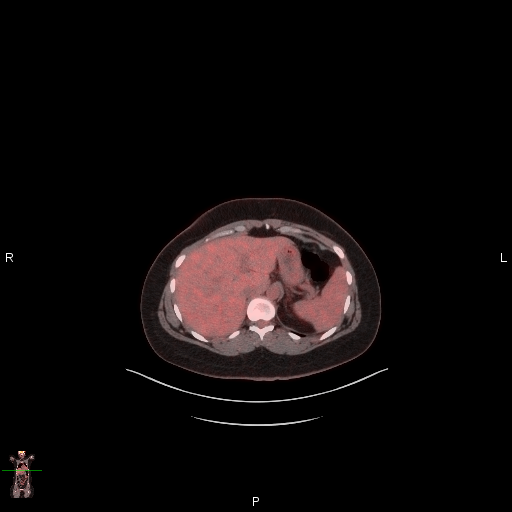
[im 88/176]
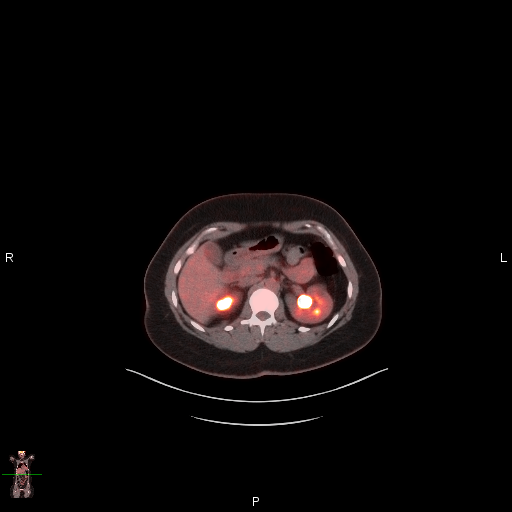
[im 99/176]
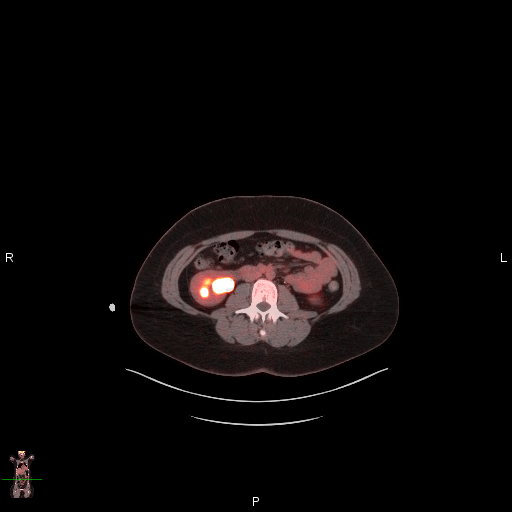
[im 110/176]
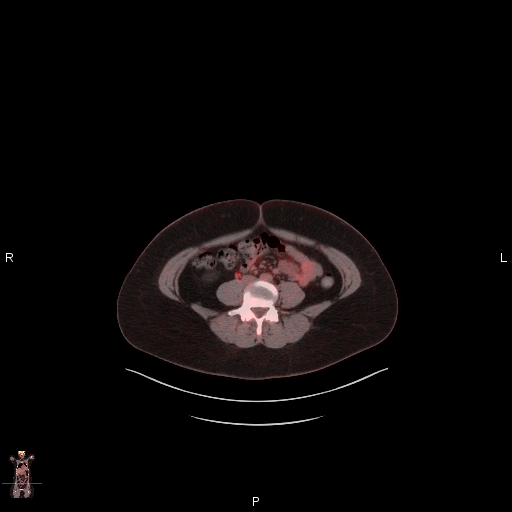
[im 121/176]
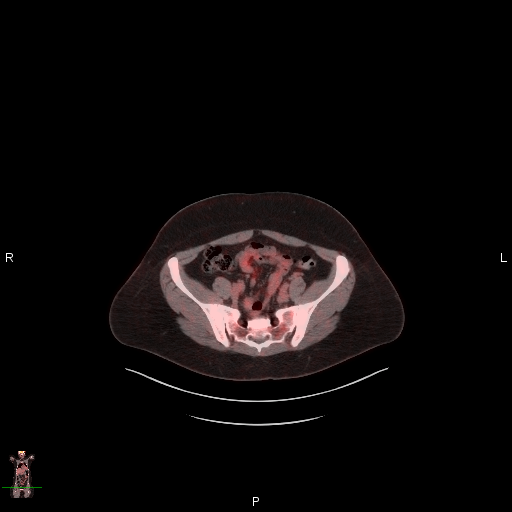
[im 132/176]
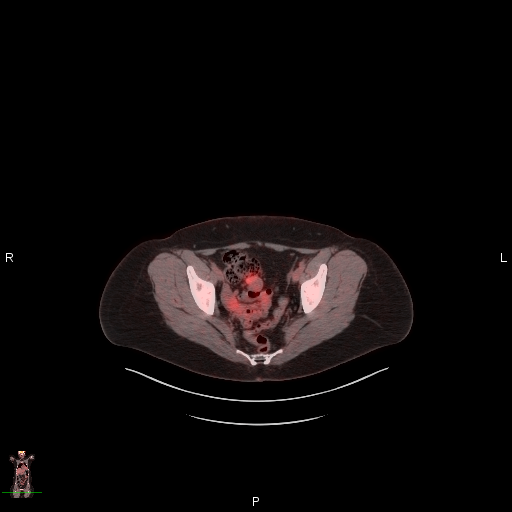
[im 143/176]
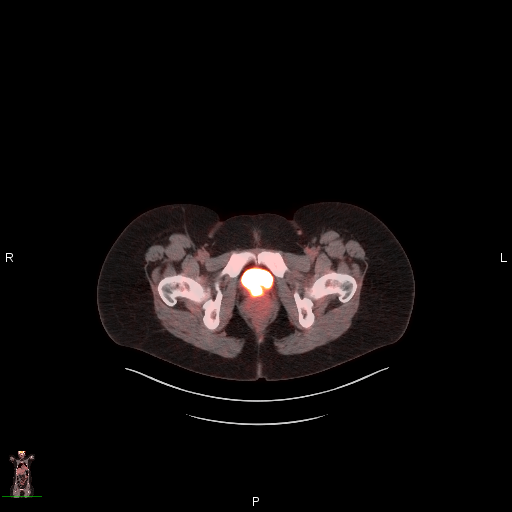
[im 154/176]
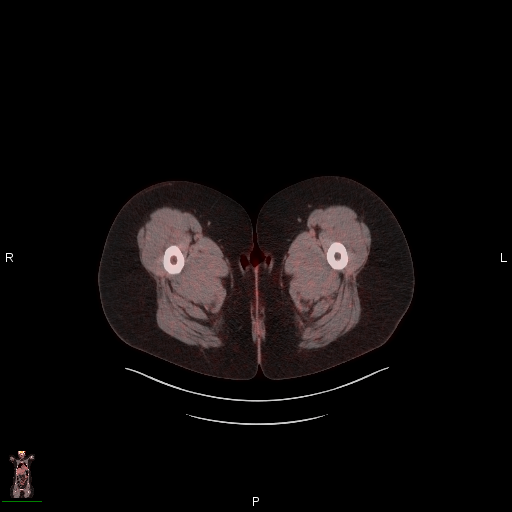
[im 165/176]
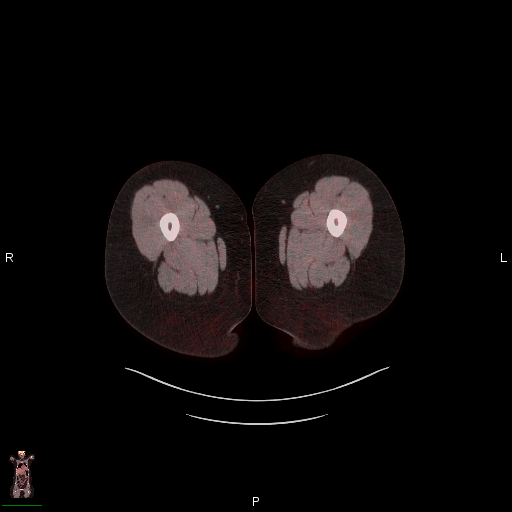
[im 176/176]
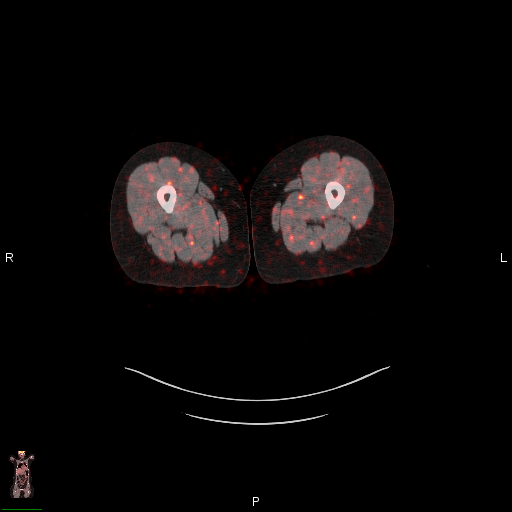

[coronal ct wb fusion · 2 of 30 slices shown]
[im 1/30]
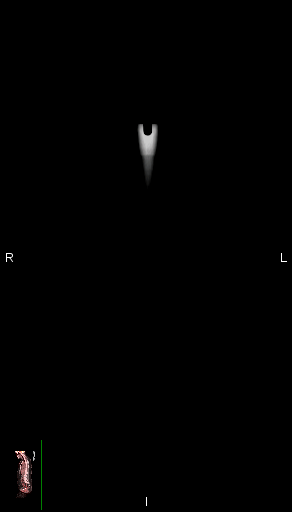
[im 30/30]
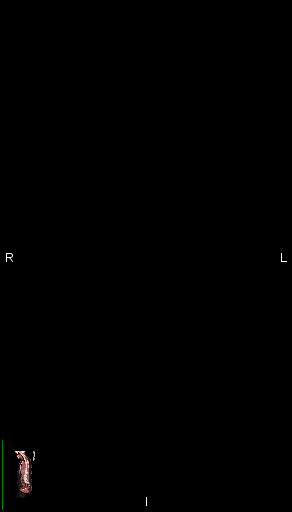

[mip · 5 of 48 slices shown]
[im 1/48]
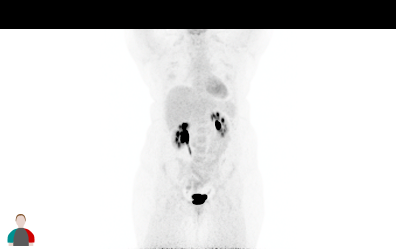
[im 12/48]
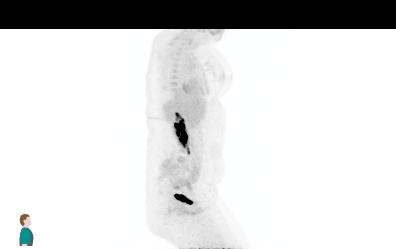
[im 24/48]
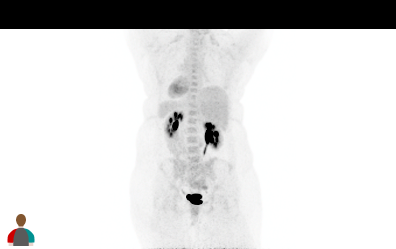
[im 36/48]
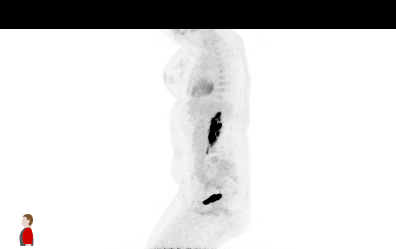
[im 48/48]
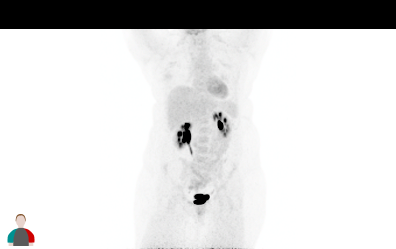

[23 of 25 positions shown; findings below may reference images not displayed]

FINDINGS: Mediastinal blood pool activity: SUV max

Liver activity: SUV max NA

NECK: No hypermetabolic cervical lymphadenopathy.

Incidental CT findings: none

CHEST: No hypermetabolic thoracic lymphadenopathy.

No suspicious pulmonary nodules.

Incidental CT findings: none

ABDOMEN/PELVIS: Very mild hypermetabolism along the 2nd portion of
the duodenum, max SUV 3.6 (PET image 103). While nonspecific, this
may correspond to the patient's duodenal lymphoma.

No suspicious lymphadenopathy in the abdomen/pelvis.

No abnormal hypermetabolism in the liver, spleen, pancreas, or
adrenal glands. Spleen is normal in size.

Mild hypermetabolism along the medial right ovary, max SUV 5.1,
likely physiologic.

Incidental CT findings: Bilateral renal cysts, poorly evaluated due
to motion degradation. 5 mm probable renal angiomyolipoma in the
left upper kidney (series 3/image 165). Mild fullness of the right
renal collecting system without frank hydronephrosis. Status post
hysterectomy.

SKELETON: No focal hypermetabolic activity to suggest skeletal
metastasis.

Incidental CT findings: none
IMPRESSION: Very mild hypermetabolism along the 2nd portion of the duodenum,
possibly corresponding to the patient's known duodenal lymphoma.

No suspicious lymphadenopathy.  Spleen is normal in size.

## 2021-09-11 ENCOUNTER — Encounter (HOSPITAL_COMMUNITY): Payer: Self-pay | Admitting: Hematology

## 2021-12-28 ENCOUNTER — Other Ambulatory Visit: Payer: Self-pay | Admitting: Nurse Practitioner

## 2022-03-15 DIAGNOSIS — S161XXA Strain of muscle, fascia and tendon at neck level, initial encounter: Secondary | ICD-10-CM | POA: Diagnosis not present

## 2022-03-15 DIAGNOSIS — Z6829 Body mass index (BMI) 29.0-29.9, adult: Secondary | ICD-10-CM | POA: Diagnosis not present

## 2022-03-18 DIAGNOSIS — L700 Acne vulgaris: Secondary | ICD-10-CM | POA: Diagnosis not present

## 2022-04-07 ENCOUNTER — Encounter: Payer: Self-pay | Admitting: Family Medicine

## 2022-04-07 ENCOUNTER — Other Ambulatory Visit (HOSPITAL_COMMUNITY)
Admission: RE | Admit: 2022-04-07 | Discharge: 2022-04-07 | Disposition: A | Discharge status: Home / Self Care | Payer: BC Managed Care – PPO | Source: Ambulatory Visit | Attending: Family Medicine | Admitting: Family Medicine

## 2022-04-07 ENCOUNTER — Ambulatory Visit (INDEPENDENT_AMBULATORY_CARE_PROVIDER_SITE_OTHER): Payer: BC Managed Care – PPO | Admitting: Family Medicine

## 2022-04-07 VITALS — BP 134/89 | HR 90 | Temp 98.0°F | Ht 67.0 in | Wt 196.0 lb

## 2022-04-07 DIAGNOSIS — Z01419 Encounter for gynecological examination (general) (routine) without abnormal findings: Secondary | ICD-10-CM

## 2022-04-07 DIAGNOSIS — F419 Anxiety disorder, unspecified: Secondary | ICD-10-CM

## 2022-04-07 DIAGNOSIS — Z01411 Encounter for gynecological examination (general) (routine) with abnormal findings: Secondary | ICD-10-CM

## 2022-04-07 DIAGNOSIS — E063 Autoimmune thyroiditis: Secondary | ICD-10-CM | POA: Diagnosis not present

## 2022-04-07 DIAGNOSIS — E038 Other specified hypothyroidism: Secondary | ICD-10-CM

## 2022-04-07 DIAGNOSIS — R1310 Dysphagia, unspecified: Secondary | ICD-10-CM

## 2022-04-07 DIAGNOSIS — K219 Gastro-esophageal reflux disease without esophagitis: Secondary | ICD-10-CM

## 2022-04-07 MED ORDER — PANTOPRAZOLE SODIUM 40 MG PO TBEC: 40.0000 mg | DELAYED_RELEASE_TABLET | Freq: Every day | ORAL | 3 refills | Status: DC

## 2022-04-07 NOTE — Progress Notes (Signed)
BP 134/89   Pulse 90   Temp 98 F (36.7 C)   Ht 5' 7"  (1.702 m)   Wt 196 lb (88.9 kg)   SpO2 99%   BMI 30.70 kg/m    Subjective:   Patient ID: Betty Hayes, female    DOB: 07-15-1986, 36 y.o.   MRN: [id number]  HPI: Betty Hayes is a 36 y.o. female presenting on 04/07/2022 for Medical Management of Chronic Issues (CPE with pap)   HPI Well woman exam and physical Patient is coming in today for well woman exam and physical.  She denies any breast or vaginal issues.  She had a hysterectomy 7 years ago for endometriosis issues.  She denies any pain with intercourse but does think she has some vaginal dryness at times.  She still gets some cramping and lower abdominal pain that she thinks may be related to cysts but is not having it currently.  She did just start doxycycline for acne.  She is starting get a little more heartburn recently since she started the doxycycline and wants to try pantoprazole again.  She did try some over-the-counter medicine that did not help.  Relevant past medical, surgical, family and social history reviewed and updated as indicated. Interim medical history since our last visit reviewed. Allergies and medications reviewed and updated.  Review of Systems  Constitutional:  Negative for chills and fever.  HENT:  Negative for congestion, ear discharge, ear pain and tinnitus.   Eyes:  Negative for pain, redness and visual disturbance.  Respiratory:  Negative for cough, chest tightness, shortness of breath and wheezing.   Cardiovascular:  Negative for chest pain, palpitations and leg swelling.  Gastrointestinal:  Negative for abdominal pain, blood in stool, constipation and diarrhea.  Genitourinary:  Negative for difficulty urinating, dysuria and hematuria.  Musculoskeletal:  Negative for back pain, gait problem and myalgias.  Skin:  Negative for rash.  Neurological:  Negative for dizziness, weakness, light-headedness and headaches.  Psychiatric/Behavioral:   Negative for agitation, behavioral problems and suicidal ideas.   All other systems reviewed and are negative.   Per HPI unless specifically indicated above   Allergies as of 04/07/2022       Reactions   Latex Rash        Medication List        Accurate as of April 07, 2022  3:31 PM. If you have any questions, ask your nurse or doctor.          STOP taking these medications    PROBIOTIC-PREBIOTIC PO Stopped by: Fransisca Kaufmann Zilphia Kozinski, MD       TAKE these medications    Clindamycin-Benzoyl Per (Refr) gel Apply 1 Application topically daily.   doxycycline 100 MG capsule Commonly known as: VIBRAMYCIN Take 100 mg by mouth daily.   ECHINACEA PO Take 760 mg by mouth at bedtime.   fexofenadine 180 MG tablet Commonly known as: ALLEGRA Take 180 mg by mouth daily as needed for allergies.   liothyronine 5 MCG tablet Commonly known as: CYTOMEL Take 5 mcg by mouth daily.   MAGNESIUM GLUCONATE PO Take 240 mg by mouth at bedtime. 120 mg   MULTIVITAMIN ADULT PO Take 1 tablet by mouth daily.   pantoprazole 40 MG tablet Commonly known as: PROTONIX Take 1 tablet (40 mg total) by mouth daily. What changed: when to take this Changed by: Fransisca Kaufmann Hatem Cull, MD   PREBIOTIC PRODUCT PO Take 330 mg by mouth daily. enzymes   prochlorperazine 10  MG tablet Commonly known as: COMPAZINE Take 1 tablet (10 mg total) by mouth every 6 (six) hours as needed for nausea or vomiting.   Selenium 200 MCG Caps Take 200 mcg by mouth at bedtime.   Synthroid 100 MCG tablet Generic drug: levothyroxine Take 100 mcg by mouth daily.   VITAMIN C PO Take 1 tablet by mouth daily.   Vitamin D3 50 MCG (2000 UT) Tabs Take 2,000 Units by mouth daily.   Zinc 23 MG Lozg Take 1 lozenge by mouth daily.         Objective:   BP 134/89   Pulse 90   Temp 98 F (36.7 C)   Ht 5' 7"  (1.702 m)   Wt 196 lb (88.9 kg)   SpO2 99%   BMI 30.70 kg/m   Wt Readings from Last 3 Encounters:   04/07/22 196 lb (88.9 kg)  07/24/21 187 lb 12.8 oz (85.2 kg)  05/28/20 197 lb 6.4 oz (89.5 kg)    Physical Exam Vitals and nursing note reviewed. Exam conducted with a chaperone present.  Constitutional:      General: She is not in acute distress.    Appearance: She is well-developed. She is not diaphoretic.  Eyes:     Conjunctiva/sclera: Conjunctivae normal.     Pupils: Pupils are equal, round, and reactive to light.  Neck:     Thyroid: No thyromegaly.  Cardiovascular:     Rate and Rhythm: Normal rate and regular rhythm.     Heart sounds: Normal heart sounds. No murmur heard. Pulmonary:     Effort: Pulmonary effort is normal. No respiratory distress.     Breath sounds: Normal breath sounds. No wheezing.  Chest:  Breasts:    Breasts are symmetrical.     Right: No inverted nipple, mass, nipple discharge, skin change or tenderness.     Left: No inverted nipple, mass, nipple discharge, skin change or tenderness.  Abdominal:     General: Bowel sounds are normal. There is no distension.     Palpations: Abdomen is soft.     Tenderness: There is no abdominal tenderness. There is no guarding or rebound.  Genitourinary:    Exam position: Lithotomy position.     Labia:        Right: No rash or lesion.        Left: No rash or lesion.      Vagina: Vaginal discharge and tenderness (Slight discharge tenderness, we will see what the Pap smear shows.) present. No bleeding, lesions or prolapsed vaginal walls.     Uterus: Absent.      Adnexa:        Right: No mass, tenderness or fullness.         Left: No mass, tenderness or fullness.    Musculoskeletal:        General: Normal range of motion.     Cervical back: Neck supple.  Lymphadenopathy:     Cervical: No cervical adenopathy.  Skin:    General: Skin is warm and dry.     Findings: No rash.  Neurological:     Mental Status: She is alert and oriented to person, place, and time.     Coordination: Coordination normal.  Psychiatric:         Behavior: Behavior normal.       Assessment & Plan:   Problem List Items Addressed This Visit       Digestive   GERD (gastroesophageal reflux disease)   Relevant Medications  pantoprazole (PROTONIX) 40 MG tablet   Dysphagia   Relevant Medications   pantoprazole (PROTONIX) 40 MG tablet     Endocrine   Hypothyroidism   Relevant Orders   TSH   Hashimoto's thyroiditis   Relevant Orders   TSH   Other Visit Diagnoses     Well woman exam with routine gynecological exam    -  Primary   Relevant Orders   Cytology - PAP([facility])   CBC with Differential/Platelet   CMP14+EGFR   Lipid panel   Anxiety       Relevant Orders   CBC with Differential/Platelet   TSH       Patient has some vaginal dryness, may consider cream for this in the future but want to rule out any kind of yeast infection with a Pap smear.  Recheck blood work and thyroid and will do yearly blood work as well.  We will start pantoprazole.  We discussed anxiety medicines including buspirone or hydroxyzine as needed and Zoloft for prevention and possibly counseling.  Seems like she gets mostly situational anxiety and she is going to look into these medicines and circle back to [place] in the future. Follow up plan: Return in about 1 year (around 04/08/2023), or if symptoms worsen or fail to improve, for Physical exam.  Counseling provided for all of the vaccine components Orders Placed This Encounter  Procedures   CBC with Differential/Platelet   CMP14+EGFR   Lipid panel   TSH    Caryl Pina, MD [facility] 04/07/2022, 3:31 PM

## 2022-04-07 NOTE — Patient Instructions (Signed)
We discussed anxiety medicines including buspirone or hydroxyzine as needed and Zoloft for prevention and possibly counseling.  Seems like she gets mostly situational anxiety and she is going to look into these medicines and circle back to [place] in the future.

## 2022-04-08 LAB — CBC WITH DIFFERENTIAL/PLATELET
Basophils Absolute: 0.1 10*3/uL (ref 0.0–0.2)
Basos: 1 %
EOS (ABSOLUTE): 0.2 10*3/uL (ref 0.0–0.4)
Eos: 2 %
Hematocrit: 41.5 % (ref 34.0–46.6)
Hemoglobin: 13.4 g/dL (ref 11.1–15.9)
Immature Grans (Abs): 0 10*3/uL (ref 0.0–0.1)
Immature Granulocytes: 0 %
Lymphocytes Absolute: 3.5 10*3/uL — ABNORMAL HIGH (ref 0.7–3.1)
Lymphs: 38 %
MCH: 27.2 pg (ref 26.6–33.0)
MCHC: 32.3 g/dL (ref 31.5–35.7)
MCV: 84 fL (ref 79–97)
Monocytes Absolute: 0.7 10*3/uL (ref 0.1–0.9)
Monocytes: 8 %
Neutrophils Absolute: 4.7 10*3/uL (ref 1.4–7.0)
Neutrophils: 51 %
Platelets: 221 10*3/uL (ref 150–450)
RBC: 4.93 x10E6/uL (ref 3.77–5.28)
RDW: 12.9 % (ref 11.7–15.4)
WBC: 9.1 10*3/uL (ref 3.4–10.8)

## 2022-04-08 LAB — LIPID PANEL
Chol/HDL Ratio: 2.7 ratio (ref 0.0–4.4)
Cholesterol, Total: 172 mg/dL (ref 100–199)
HDL: 63 mg/dL (ref >39)
LDL Chol Calc (NIH): 92 mg/dL (ref 0–99)
Triglycerides: 95 mg/dL (ref 0–149)
VLDL Cholesterol Cal: 17 mg/dL (ref 5–40)

## 2022-04-08 LAB — CMP14+EGFR
ALT: 16 IU/L (ref 0–32)
AST: 21 IU/L (ref 0–40)
Albumin/Globulin Ratio: 2 (ref 1.2–2.2)
Albumin: 4.9 g/dL (ref 3.9–4.9)
Alkaline Phosphatase: 78 IU/L (ref 44–121)
BUN/Creatinine Ratio: 12 (ref 9–23)
BUN: 9 mg/dL (ref 6–20)
Bilirubin Total: 0.9 mg/dL (ref 0.0–1.2)
CO2: 24 mmol/L (ref 20–29)
Calcium: 9.5 mg/dL (ref 8.7–10.2)
Chloride: 102 mmol/L (ref 96–106)
Creatinine, Ser: 0.78 mg/dL (ref 0.57–1.00)
Globulin, Total: 2.5 g/dL (ref 1.5–4.5)
Glucose: 92 mg/dL (ref 70–99)
Potassium: 4.4 mmol/L (ref 3.5–5.2)
Sodium: 142 mmol/L (ref 134–144)
Total Protein: 7.4 g/dL (ref 6.0–8.5)
eGFR: 100 mL/min/{1.73_m2} (ref >59)

## 2022-04-08 LAB — TSH: TSH: 3.67 u[IU]/mL (ref 0.450–4.500)

## 2022-04-11 LAB — CYTOLOGY - PAP: Diagnosis: NEGATIVE

## 2022-04-14 ENCOUNTER — Encounter: Payer: Self-pay | Admitting: Family Medicine

## 2022-04-14 DIAGNOSIS — F419 Anxiety disorder, unspecified: Secondary | ICD-10-CM

## 2022-04-16 MED ORDER — HYDROXYZINE PAMOATE 25 MG PO CAPS: 25.0000 mg | ORAL_CAPSULE | Freq: Three times a day (TID) | ORAL | 2 refills | Status: DC | PRN

## 2022-07-24 ENCOUNTER — Telehealth: Payer: BC Managed Care – PPO | Admitting: Family Medicine

## 2022-07-24 ENCOUNTER — Inpatient Hospital Stay: Payer: BC Managed Care – PPO

## 2022-07-24 DIAGNOSIS — U071 COVID-19: Secondary | ICD-10-CM | POA: Diagnosis not present

## 2022-07-24 DIAGNOSIS — J029 Acute pharyngitis, unspecified: Secondary | ICD-10-CM

## 2022-07-24 NOTE — Progress Notes (Signed)
[facility]   Would like to be swabbed for covid, strep, and flu- post traveling illness- will go be seen in person. Patient acknowledged agreement and understanding of the plan.

## 2022-07-31 ENCOUNTER — Ambulatory Visit: Payer: BC Managed Care – PPO | Admitting: Hematology

## 2022-08-05 ENCOUNTER — Encounter: Payer: Self-pay | Admitting: Family Medicine

## 2022-08-08 ENCOUNTER — Other Ambulatory Visit: Payer: Self-pay

## 2022-08-08 DIAGNOSIS — C8209 Follicular lymphoma grade I, extranodal and solid organ sites: Secondary | ICD-10-CM

## 2022-08-11 ENCOUNTER — Inpatient Hospital Stay: Payer: BC Managed Care – PPO | Attending: Hematology

## 2022-08-11 DIAGNOSIS — E063 Autoimmune thyroiditis: Secondary | ICD-10-CM | POA: Insufficient documentation

## 2022-08-11 DIAGNOSIS — C8209 Follicular lymphoma grade I, extranodal and solid organ sites: Secondary | ICD-10-CM | POA: Diagnosis not present

## 2022-08-11 DIAGNOSIS — Z79890 Hormone replacement therapy (postmenopausal): Secondary | ICD-10-CM | POA: Diagnosis not present

## 2022-08-11 LAB — COMPREHENSIVE METABOLIC PANEL
ALT: 44 U/L (ref 0–44)
AST: 25 U/L (ref 15–41)
Albumin: 3.4 g/dL — ABNORMAL LOW (ref 3.5–5.0)
Alkaline Phosphatase: 51 U/L (ref 38–126)
Anion gap: 7 (ref 5–15)
BUN: 8 mg/dL (ref 6–20)
CO2: 22 mmol/L (ref 22–32)
Calcium: 8.4 mg/dL — ABNORMAL LOW (ref 8.9–10.3)
Chloride: 106 mmol/L (ref 98–111)
Creatinine, Ser: 0.75 mg/dL (ref 0.44–1.00)
GFR, Estimated: >60 mL/min (ref >60)
Glucose, Bld: 106 mg/dL — ABNORMAL HIGH (ref 70–99)
Potassium: 3.9 mmol/L (ref 3.5–5.1)
Sodium: 135 mmol/L (ref 135–145)
Total Bilirubin: 0.9 mg/dL (ref 0.3–1.2)
Total Protein: 6.4 g/dL — ABNORMAL LOW (ref 6.5–8.1)

## 2022-08-11 LAB — CBC WITH DIFFERENTIAL/PLATELET
Abs Immature Granulocytes: 0.05 10*3/uL (ref 0.00–0.07)
Basophils Absolute: 0 10*3/uL (ref 0.0–0.1)
Basophils Relative: 0 %
Eosinophils Absolute: 0.2 10*3/uL (ref 0.0–0.5)
Eosinophils Relative: 3 %
HCT: 38.9 % (ref 36.0–46.0)
Hemoglobin: 12.8 g/dL (ref 12.0–15.0)
Immature Granulocytes: 1 %
Lymphocytes Relative: 33 %
Lymphs Abs: 2.3 10*3/uL (ref 0.7–4.0)
MCH: 27.6 pg (ref 26.0–34.0)
MCHC: 32.9 g/dL (ref 30.0–36.0)
MCV: 84 fL (ref 80.0–100.0)
Monocytes Absolute: 0.6 10*3/uL (ref 0.1–1.0)
Monocytes Relative: 8 %
Neutro Abs: 3.9 10*3/uL (ref 1.7–7.7)
Neutrophils Relative %: 55 %
Platelets: 153 10*3/uL (ref 150–400)
RBC: 4.63 MIL/uL (ref 3.87–5.11)
RDW: 13.5 % (ref 11.5–15.5)
WBC: 7 10*3/uL (ref 4.0–10.5)
nRBC: 0 % (ref 0.0–0.2)

## 2022-08-11 LAB — LACTATE DEHYDROGENASE: LDH: 127 U/L (ref 98–192)

## 2022-08-18 ENCOUNTER — Inpatient Hospital Stay (HOSPITAL_BASED_OUTPATIENT_CLINIC_OR_DEPARTMENT_OTHER): Payer: BC Managed Care – PPO | Admitting: Hematology

## 2022-08-18 ENCOUNTER — Encounter: Payer: Self-pay | Admitting: Hematology

## 2022-08-18 VITALS — BP 122/84 | HR 79 | Temp 98.2°F | Resp 18 | Ht 67.0 in | Wt 204.6 lb

## 2022-08-18 DIAGNOSIS — E063 Autoimmune thyroiditis: Secondary | ICD-10-CM | POA: Diagnosis not present

## 2022-08-18 DIAGNOSIS — C8209 Follicular lymphoma grade I, extranodal and solid organ sites: Secondary | ICD-10-CM

## 2022-08-18 DIAGNOSIS — Z79890 Hormone replacement therapy (postmenopausal): Secondary | ICD-10-CM | POA: Diagnosis not present

## 2022-08-18 NOTE — Patient Instructions (Addendum)
[place]  Discharge Instructions  You were seen and examined today by Dr. Delton Coombes.  Dr. Delton Coombes discussed your most recent lab work which revealed all of your labs look okay.  Follow-up as scheduled in 1 year.    Thank you for choosing [facility] to provide your oncology and hematology care.   To afford each patient quality time with our provider, please arrive at least 15 minutes before your scheduled appointment time. You may need to reschedule your appointment if you arrive late (10 or more minutes). Arriving late affects you and other patients whose appointments are after yours.  Also, if you miss three or more appointments without notifying the office, you may be dismissed from the clinic at the provider's discretion.    Again, thank you for choosing [facility].  Our hope is that these requests will decrease the amount of time that you wait before being seen by our physicians.   If you have a lab appointment with the [facility] please come in thru the Main Entrance and check in at the main information desk.           _____________________________________________________________  Should you have questions after your visit to [facility], please contact our office at [phone] and follow the prompts.  Our office hours are 8:00 a.m. to 4:30 p.m. Monday - Thursday and 8:00 a.m. to 2:30 p.m. Friday.  Please note that voicemails left after 4:00 p.m. may not be returned until the following business day.  We are closed weekends and all major holidays.  You do have access to a nurse 24-7, just call the main number to the clinic [phone] and do not press any options, hold on the line and a nurse will answer the phone.    For prescription refill requests, have your pharmacy contact our office and allow 72 hours.    Masks are optional in the cancer centers. If you would like for your care team  to wear a mask while they are taking care of you, please let them know. You may have one support person who is at least 39 years old accompany you for your appointments.

## 2022-08-18 NOTE — Progress Notes (Signed)
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  PRIOR THERAPY: Weekly rituximab x 4 cycles from 10/27/2019 to 11/18/2019  CURRENT THERAPY: surveillance  BRIEF ONCOLOGIC HISTORY:  Oncology History  Follicular lymphoma ([place])  07/28/2019 Initial Diagnosis   Follicular lymphoma ([place])   09/29/2019 Cancer Staging   Staging form: Hodgkin and Non-Hodgkin Lymphoma, AJCC 8th Edition - Clinical stage from 6/83/4196: Stage IE (Follicular lymphoma) - Signed by Betty Jack, MD on 09/11/9796   Grade I follicular lymphoma of extranodal site excluding spleen and other solid organs ([place])  09/29/2019 Initial Diagnosis   Grade I follicular lymphoma of extranodal site excluding spleen and other solid organs ([place])   10/27/2019 - 11/18/2019 Chemotherapy   Patient is on Treatment Plan : NON-HODGKINS LYMPHOMA Rituximab q7d       CANCER STAGING:  Cancer Staging  Follicular lymphoma ([place]) Staging form: Hodgkin and Non-Hodgkin Lymphoma, AJCC 8th Edition - Clinical stage from 04/10/1940: Stage IE (Follicular lymphoma) - Signed by Betty Jack, MD on 09/29/2019   INTERVAL HISTORY:  [name], a 37 y.o. female, seen for follow-up of low-grade follicular lymphoma of the duodenum.  She denies any fevers, night sweats or weight loss in the last 1 year.  Energy levels are 100%.  She reports that she gets pain in the right posterior chest wall on and off even at rest which stays for few minutes and goes away.  She reports that this pain is more frequent since the beginning of the year.  REVIEW OF SYSTEMS:  Review of Systems  Constitutional:  Negative for appetite change, fatigue, fever and unexpected weight change.  Cardiovascular:  Positive for chest pain (Posterior chest  wall).  Gastrointestinal:  Positive for constipation.  Skin:  Negative for rash.  Psychiatric/Behavioral:  Positive for sleep disturbance.   All other systems reviewed and are negative.   PAST MEDICAL/SURGICAL HISTORY:  Past Medical History:  Diagnosis Date   Allergy    Cancer ([place]) 74/0814   follicular lymphoma   Dysphagia    GERD (gastroesophageal reflux disease)    Thyroid disease    Hypothyroidism   Thyroid nodule 2020   Past Surgical History:  Procedure Laterality Date   ABDOMINAL HYSTERECTOMY     BIOPSY  05/09/2020   Procedure: BIOPSY;  Surgeon: Betty Dolin, MD;  [facility];  Service: Endoscopy;;  duodenal   ESOPHAGOGASTRODUODENOSCOPY N/A 07/13/2019   Procedure: ESOPHAGOGASTRODUODENOSCOPY (EGD);  Surgeon: Betty Dolin, MD; normal esophagus s/p dilation and biopsied, normal stomach, patchy areas of whitish, nodular mucosa in the second portion of the duodenum s/p biopsy.  Duodenal biopsy with low-grade follicular lymphoma.  Esophageal biopsies benign without increased intraepithelial eosinophils.   ESOPHAGOGASTRODUODENOSCOPY N/A 05/09/2020   Procedure: ESOPHAGOGASTRODUODENOSCOPY (EGD);  Surgeon: Betty Dolin, MD;  Location: [facility];  Service: Endoscopy;  Laterality: N/A;  7:30pm   KIDNEY STONE SURGERY     MALONEY DILATION N/A 07/13/2019   Procedure: MALONEY DILATION;  Surgeon: Betty Dolin, MD;  Location: [facility];  Service: Endoscopy;  Laterality: N/A;   TONSILLECTOMY     Around age 8   WISDOM TOOTH EXTRACTION      SOCIAL HISTORY:  Social History   Socioeconomic History   Marital status: Married  Spouse name: Not on file   Number of children: 2   Years of education: Not on file   Highest education level: Not on file  Occupational History    Employer: [employer]  Tobacco Use   Smoking status: Never   Smokeless tobacco: Never  Vaping Use   Vaping Use: Never used  Substance and Sexual Activity   Alcohol use: Yes     Comment: occasional- maybe once a month   Drug use: No   Sexual activity: Yes    Birth control/protection: Surgical    Comment: married 14 years  Other Topics Concern   Not on file  Social History Narrative   Not on file   Social Determinants of Health   Financial Resource Strain: Low Risk  (07/28/2019)   Overall Financial Resource Strain (CARDIA)    Difficulty of Paying Living Expenses: Not hard at all  Food Insecurity: No Food Insecurity (07/28/2019)   Hunger Vital Sign    Worried About Running Out of Food in the Last Year: Never true    [place] in the Last Year: Never true  Transportation Needs: No Transportation Needs (07/28/2019)   PRAPARE - Hydrologist (Medical): No    Lack of Transportation (Non-Medical): No  Physical Activity: Insufficiently Active (07/28/2019)   Exercise Vital Sign    Days of Exercise per Week: 5 days    Minutes of Exercise per Session: 20 min  Stress: Stress Concern Present (07/28/2019)   [place]    Feeling of Stress : To some extent  Social Connections: Moderately Isolated (07/28/2019)   Social Connection and Isolation Panel [NHANES]    Frequency of Communication with Friends and Family: More than three times a week    Frequency of Social Gatherings with Friends and Family: More than three times a week    Attends Religious Services: Never    Marine scientist or Organizations: No    Attends Archivist Meetings: Never    Marital Status: Married  Human resources officer Violence: Not At Risk (07/28/2019)   Humiliation, Afraid, Rape, and Kick questionnaire    Fear of Current or Ex-Partner: No    Emotionally Abused: No    Physically Abused: No    Sexually Abused: No    FAMILY HISTORY:  Family History  Problem Relation Age of Onset   Thrombocytopenia Mother        itp   Heart disease Mother    Hypertension Mother    Seizures Brother    ADD /  ADHD Brother    Colon cancer Paternal Grandfather        in 61s   Diabetes Maternal Grandmother    Stroke Maternal Grandmother    Heart disease Maternal Grandfather    Stroke Maternal Grandfather    Hypothyroidism Paternal Grandmother    Psoriasis Brother    Asthma Daughter    Kidney disease Maternal Uncle    Non-Hodgkin's lymphoma Paternal Uncle    Leukemia Paternal Great-grandmother    Ovarian cancer Maternal Great-grandmother    Breast cancer Other        maternal great aunt   Colon polyps Neg Hx     CURRENT MEDICATIONS:  Current Outpatient Medications  Medication Sig Dispense Refill   Ascorbic Acid (VITAMIN C PO) Take 1 tablet by mouth daily.     Cholecalciferol (VITAMIN D3) 50 MCG (2000 UT) TABS Take 2,000 Units by mouth  daily.     ECHINACEA PO Take 760 mg by mouth at bedtime.     fexofenadine (ALLEGRA) 180 MG tablet Take 180 mg by mouth daily as needed for allergies.      hydrOXYzine (VISTARIL) 25 MG capsule Take 1 capsule (25 mg total) by mouth every 8 (eight) hours as needed. 60 capsule 2   liothyronine (CYTOMEL) 5 MCG tablet Take 5 mcg by mouth daily.     MAGNESIUM GLUCONATE PO Take 240 mg by mouth at bedtime. 120 mg     Multiple Vitamins-Minerals (MULTIVITAMIN ADULT PO) Take 1 tablet by mouth daily.      pantoprazole (PROTONIX) 40 MG tablet Take 1 tablet (40 mg total) by mouth daily. 90 tablet 3   PREBIOTIC PRODUCT PO Take 330 mg by mouth daily. enzymes     prochlorperazine (COMPAZINE) 10 MG tablet Take 1 tablet (10 mg total) by mouth every 6 (six) hours as needed for nausea or vomiting. 30 tablet 2   Selenium 200 MCG CAPS Take 200 mcg by mouth at bedtime.     SYNTHROID 88 MCG tablet Take 88 mcg by mouth daily.     Zinc 23 MG LOZG Take 1 lozenge by mouth daily.     No current facility-administered medications for this visit.    ALLERGIES:  Allergies  Allergen Reactions   Latex Rash    PHYSICAL EXAM:  Performance status (ECOG): 0 - Asymptomatic  Vitals:    08/18/22 1513  BP: 122/84  Pulse: 79  Resp: 18  Temp: 98.2 F (36.8 C)  SpO2: 98%   Wt Readings from Last 3 Encounters:  08/18/22 204 lb 9.6 oz (92.8 kg)  04/07/22 196 lb (88.9 kg)  07/24/21 187 lb 12.8 oz (85.2 kg)   Physical Exam Vitals reviewed.  Constitutional:      Appearance: Normal appearance.  Cardiovascular:     Rate and Rhythm: Normal rate and regular rhythm.     Pulses: Normal pulses.     Heart sounds: Normal heart sounds.  Pulmonary:     Effort: Pulmonary effort is normal.     Breath sounds: Normal breath sounds.  Abdominal:     Palpations: Abdomen is soft. There is no hepatomegaly, splenomegaly or mass.     Tenderness: There is no abdominal tenderness.  Lymphadenopathy:     Cervical: No cervical adenopathy.     Right cervical: No superficial, deep or posterior cervical adenopathy.    Left cervical: No superficial, deep or posterior cervical adenopathy.     Upper Body:     Right upper body: No supraclavicular, axillary or pectoral adenopathy.     Left upper body: No supraclavicular, axillary or pectoral adenopathy.     Lower Body: No right inguinal adenopathy. No left inguinal adenopathy.  Neurological:     General: No focal deficit present.     Mental Status: She is alert and oriented to person, place, and time.  Psychiatric:        Mood and Affect: Mood normal.        Behavior: Behavior normal.     LABORATORY DATA:  I have reviewed the labs as listed.     Latest Ref Rng & Units 08/11/2022    8:14 AM 04/07/2022    3:41 PM 07/17/2021    8:17 AM  CBC  WBC 4.0 - 10.5 K/uL 7.0  9.1  5.9   Hemoglobin 12.0 - 15.0 g/dL 12.8  13.4  13.1   Hematocrit 36.0 - 46.0 % 38.9  41.5  39.7   Platelets 150 - 400 K/uL 153  221  188       Latest Ref Rng & Units 08/11/2022    8:14 AM 04/07/2022    3:41 PM 07/17/2021    8:17 AM  CMP  Glucose 70 - 99 mg/dL 106  92  99   BUN 6 - 20 mg/dL '8  9  13   '$ Creatinine 0.44 - 1.00 mg/dL 0.75  0.78  0.73   Sodium 135 - 145  mmol/L 135  142  138   Potassium 3.5 - 5.1 mmol/L 3.9  4.4  4.3   Chloride 98 - 111 mmol/L 106  102  108   CO2 22 - 32 mmol/L '22  24  24   '$ Calcium 8.9 - 10.3 mg/dL 8.4  9.5  9.1   Total Protein 6.5 - 8.1 g/dL 6.4  7.4  7.5   Total Bilirubin 0.3 - 1.2 mg/dL 0.9  0.9  0.9   Alkaline Phos 38 - 126 U/L 51  78  57   AST 15 - 41 U/L '25  21  24   '$ ALT 0 - 44 U/L 44  16  20     DIAGNOSTIC IMAGING:  I have independently reviewed the scans and discussed with the patient. No results found.   ASSESSMENT:  1.  Stage I AE duodenal follicular lymphoma: -EGD on 07/13/2019 shows patchy areas of whitish, nodular mucosa in second part of the duodenum, biopsy consistent with grade 1-6/9 follicular lymphoma. -PET scan on 07/25/2019-very mild hypermetabolic along the second portion of the duodenum, maximum SUV 3.6. -No B symptoms.  She lost some weight due to digestive problems. -Bone marrow biopsy on 08/02/2019 shows normocellular marrow with trilineage hematopoiesis. -She has acid reflux symptoms which improved mildly after starting gluten-free diet.  Continues to have nausea and diarrhea. -Weekly rituximab from 10/27/2019 through 11/18/2019. -Presentation with diarrhea, diagnosed with Cyclospora Cayetanensis on 01/27/2020, treated with Bactrim. -EGD on 05/09/2020 showed normal stomach and esophagus.  Area of abnormality in the distal second portion of the duodenum, smaller area (approximately 60% smaller than area seen 1 year ago).  Remainder of the first, second and third portion of duodenum normal. -Duodenal biopsy consistent with follicular lymphoma.   2.  Hashimoto's thyroiditis: -She is on Synthroid.   3.  Family history: -Paternal uncle had DLBCL.  Paternal grandmother had leukemia.  Paternal grandfather had colon cancer. -Maternal great aunt had breast cancer.  Maternal great grandmother had ovarian cancer.  Mother had ITP. -Maternal grandmother is newly diagnosed with large B-cell lymphoma.   PLAN:   1.  Stage I AE duodenal follicular lymphoma: - She does not have any fevers, night sweats or weight loss. - She is taking her Protonix as needed.  Denies any regurgitation. - Physical examination today did not reveal any palpable adenopathy or splenomegaly. - Reviewed labs from 08/11/2022 which showed normal LFTs.  CBC was grossly normal.  LDH was normal.  Mild low albumin and calcium noted. - She reported right posterior chest wall pain on and off, more frequent since the beginning of the year.  She will follow-up with Dr. Warrick Parisian. - No evidence of progression of her lymphoma.  RTC 1 year for follow-up with repeat labs.   Orders placed this encounter:  No orders of the defined types were placed in this encounter.    Betty Jack, MD [place] [phone]

## 2022-08-27 DIAGNOSIS — E038 Other specified hypothyroidism: Secondary | ICD-10-CM | POA: Diagnosis not present

## 2022-08-27 DIAGNOSIS — E063 Autoimmune thyroiditis: Secondary | ICD-10-CM | POA: Diagnosis not present

## 2022-08-27 DIAGNOSIS — Z79890 Hormone replacement therapy (postmenopausal): Secondary | ICD-10-CM | POA: Diagnosis not present

## 2022-10-02 ENCOUNTER — Encounter: Payer: Self-pay | Admitting: Family Medicine

## 2022-10-02 ENCOUNTER — Ambulatory Visit (INDEPENDENT_AMBULATORY_CARE_PROVIDER_SITE_OTHER): Payer: BC Managed Care – PPO | Admitting: Family Medicine

## 2022-10-02 VITALS — BP 139/91 | HR 81 | Ht 67.0 in | Wt 210.0 lb

## 2022-10-02 DIAGNOSIS — J45990 Exercise induced bronchospasm: Secondary | ICD-10-CM | POA: Diagnosis not present

## 2022-10-02 DIAGNOSIS — E669 Obesity, unspecified: Secondary | ICD-10-CM | POA: Diagnosis not present

## 2022-10-02 MED ORDER — SEMAGLUTIDE-WEIGHT MANAGEMENT 1 MG/0.5ML ~~LOC~~ SOAJ: 1.0000 mg | SUBCUTANEOUS | 0 refills | Status: AC

## 2022-10-02 MED ORDER — ALBUTEROL SULFATE HFA 108 (90 BASE) MCG/ACT IN AERS: 2.0000 | INHALATION_SPRAY | Freq: Four times a day (QID) | RESPIRATORY_TRACT | 0 refills | Status: DC | PRN

## 2022-10-02 MED ORDER — SEMAGLUTIDE-WEIGHT MANAGEMENT 0.5 MG/0.5ML ~~LOC~~ SOAJ: 0.5000 mg | SUBCUTANEOUS | 0 refills | Status: AC

## 2022-10-02 MED ORDER — SEMAGLUTIDE-WEIGHT MANAGEMENT 0.25 MG/0.5ML ~~LOC~~ SOAJ: 0.2500 mg | SUBCUTANEOUS | 0 refills | Status: AC

## 2022-10-02 MED ORDER — SEMAGLUTIDE-WEIGHT MANAGEMENT 2.4 MG/0.75ML ~~LOC~~ SOAJ: 2.4000 mg | SUBCUTANEOUS | 0 refills | Status: AC

## 2022-10-02 MED ORDER — SEMAGLUTIDE-WEIGHT MANAGEMENT 1.7 MG/0.75ML ~~LOC~~ SOAJ: 1.7000 mg | SUBCUTANEOUS | 0 refills | Status: AC

## 2022-10-02 NOTE — Progress Notes (Signed)
BP (!) 139/91   Pulse 81   Ht '5\' 7"'$  (1.702 m)   Wt 210 lb (95.3 kg)   SpO2 99%   BMI 32.89 kg/m    Subjective:   Patient ID: Betty Hayes, female    DOB: 1986-04-19, 37 y.o.   MRN: [id number]  HPI: Betty Hayes is a 37 y.o. female presenting on 10/02/2022 for Medical Management of Chronic Issues and Obesity   HPI Obesity and weight gain Patient is coming in today to discuss weight gain.  She has been trying multiple different diet options.  She has tried [place] in the past.  She is trying to eat more salmon and watch low carbs going gluten-free she is even tried [employer] in the past.  She just feels like she is not getting anywhere with this.  She recently had her thyroid checked by her endocrinologist and February along with other hormones and everything came back normal.  Patient does say that she has not been exercising much because she does feel like she gets a little bit of wheezing and short of breath when she tries to exercise and almost feels like she has an exercise asthma.  She does have some allergy issues that run in her family  Relevant past medical, surgical, family and social history reviewed and updated as indicated. Interim medical history since our last visit reviewed. Allergies and medications reviewed and updated.  Review of Systems  Constitutional:  Negative for chills and fever.  Eyes:  Negative for visual disturbance.  Respiratory:  Negative for chest tightness and shortness of breath.   Cardiovascular:  Negative for chest pain and leg swelling.  Musculoskeletal:  Negative for back pain and gait problem.  Skin:  Negative for rash.  Neurological:  Negative for dizziness, light-headedness and headaches.  Psychiatric/Behavioral:  Negative for agitation and behavioral problems.   All other systems reviewed and are negative.   Per HPI unless specifically indicated above   Allergies as of 10/02/2022       Reactions   Latex Rash         Medication List        Accurate as of October 02, 2022  2:22 PM. If you have any questions, ask your nurse or doctor.          albuterol 108 (90 Base) MCG/ACT inhaler Commonly known as: VENTOLIN HFA Inhale 2 puffs into the lungs every 6 (six) hours as needed for wheezing or shortness of breath. Started by: Worthy Rancher, MD   calcium carbonate 1250 (500 Ca) MG tablet Commonly known as: OS-CAL - dosed in mg of elemental calcium Take 1 tablet by mouth daily with breakfast.   ECHINACEA PO Take 760 mg by mouth at bedtime.   fexofenadine 180 MG tablet Commonly known as: ALLEGRA Take 180 mg by mouth daily as needed for allergies.   hydrOXYzine 25 MG capsule Commonly known as: VISTARIL Take 1 capsule (25 mg total) by mouth every 8 (eight) hours as needed.   liothyronine 5 MCG tablet Commonly known as: CYTOMEL Take 5 mcg by mouth daily.   MAGNESIUM GLUCONATE PO Take 240 mg by mouth at bedtime. 120 mg   MULTIVITAMIN ADULT PO Take 1 tablet by mouth daily.   pantoprazole 40 MG tablet Commonly known as: PROTONIX Take 1 tablet (40 mg total) by mouth daily.   PREBIOTIC PRODUCT PO Take 330 mg by mouth daily. enzymes   prochlorperazine 10 MG tablet Commonly known as: COMPAZINE Take  1 tablet (10 mg total) by mouth every 6 (six) hours as needed for nausea or vomiting.   Selenium 200 MCG Caps Take 200 mcg by mouth at bedtime.   Semaglutide-Weight Management 0.25 MG/0.5ML Soaj Inject 0.25 mg into the skin once a week for 28 days. Started by: Worthy Rancher, MD   Semaglutide-Weight Management 0.5 MG/0.5ML Soaj Inject 0.5 mg into the skin once a week for 28 days. Start taking on: October 31, 2022 Started by: Fransisca Kaufmann Eleina Jergens, MD   Semaglutide-Weight Management 1 MG/0.5ML Soaj Inject 1 mg into the skin once a week for 28 days. Start taking on: Nov 29, 2022 Started by: Fransisca Kaufmann Lesean Woolverton, MD   Semaglutide-Weight Management 1.7 MG/0.75ML Soaj Inject 1.7 mg into  the skin once a week for 28 days. Start taking on: December 28, 2022 Started by: Fransisca Kaufmann Gabrelle Roca, MD   Semaglutide-Weight Management 2.4 MG/0.75ML Soaj Inject 2.4 mg into the skin once a week for 28 days. Start taking on: January 26, 2023 Started by: Fransisca Kaufmann Gaius Ishaq, MD   Synthroid 88 MCG tablet Generic drug: levothyroxine Take 88 mcg by mouth daily.   VITAMIN C PO Take 1 tablet by mouth daily.   Vitamin D3 50 MCG (2000 UT) Tabs Generic drug: Cholecalciferol Take 2,000 Units by mouth daily.   Zinc 23 MG Lozg Take 1 lozenge by mouth daily.         Objective:   BP (!) 139/91   Pulse 81   Ht '5\' 7"'$  (1.702 m)   Wt 210 lb (95.3 kg)   SpO2 99%   BMI 32.89 kg/m   Wt Readings from Last 3 Encounters:  10/02/22 210 lb (95.3 kg)  08/18/22 204 lb 9.6 oz (92.8 kg)  04/07/22 196 lb (88.9 kg)    Physical Exam Vitals and nursing note reviewed.  Constitutional:      General: She is not in acute distress.    Appearance: She is well-developed. She is not diaphoretic.  Eyes:     Conjunctiva/sclera: Conjunctivae normal.  Cardiovascular:     Rate and Rhythm: Normal rate and regular rhythm.     Heart sounds: Normal heart sounds. No murmur heard. Pulmonary:     Effort: Pulmonary effort is normal. No respiratory distress.     Breath sounds: Normal breath sounds. No wheezing.  Musculoskeletal:        General: No swelling or tenderness. Normal range of motion.  Skin:    General: Skin is warm and dry.     Findings: No rash.  Neurological:     Mental Status: She is alert and oriented to person, place, and time.     Coordination: Coordination normal.  Psychiatric:        Behavior: Behavior normal.       Assessment & Plan:   Problem List Items Addressed This Visit       Other   Obesity (BMI 30.0-34.9) - Primary   Relevant Medications   Semaglutide-Weight Management 0.25 MG/0.5ML SOAJ   Semaglutide-Weight Management 0.5 MG/0.5ML SOAJ (Start on 10/31/2022)    Semaglutide-Weight Management 1 MG/0.5ML SOAJ (Start on 11/29/2022)   Semaglutide-Weight Management 1.7 MG/0.75ML SOAJ (Start on 12/28/2022)   Semaglutide-Weight Management 2.4 MG/0.75ML SOAJ (Start on 01/26/2023)   Other Relevant Orders   Amb ref to Medical Nutrition Therapy-MNT   Other Visit Diagnoses     Exercise-induced asthma       Relevant Medications   albuterol (VENTOLIN HFA) 108 (90 Base) MCG/ACT inhaler  Patient's BMI is >30 mg/m2.  Patient's current BMI is Body mass index is 32.89 kg/m.Marland Kitchen  Patient is currently enrolled in a healthy eating plan along with encouraged exercise.  Insomnia with phentermine and would not tolerate Qsymia.  Insurance would not cover Contrave patient has contraindications to phentermine, Contrave & Qsymia (contains phentermine).  Patient does not have a personal or family history of medullary thyroid carcinoma (MTC) or Multiple Endocrine Neoplasia syndrome type 2 (MEN 2).  Follow up plan: Return in about 3 months (around 01/02/2023), or if symptoms worsen or fail to improve, for Obesity and weight recheck.  Counseling provided for all of the vaccine components Orders Placed This Encounter  Procedures   Amb ref to [place], MD [facility] 10/02/2022, 2:22 PM

## 2022-10-07 ENCOUNTER — Telehealth: Payer: Self-pay

## 2022-10-07 ENCOUNTER — Telehealth: Payer: Self-pay | Admitting: Family Medicine

## 2022-10-07 DIAGNOSIS — F419 Anxiety disorder, unspecified: Secondary | ICD-10-CM

## 2022-10-07 NOTE — Telephone Encounter (Signed)
Received a fax regarding Prior Authorization from [place] for Wegovy 0.25mg /0.59ml.   Authorization has been DENIED because your plan only covers this drug if you have been in a comprehensive weight management program for at least 6 months before starting this drug. We have denied your request because you have not been taking part in a comprehensive weight management program for at least 6 months. We reviewed the information we had. Your request has been denied.  Phone# [phone]  Please be advised we currently do not have a pharmacist to review denials, therefore you will need to process appeals accordingly as needed. Thanks for your support at this time.

## 2022-10-07 NOTE — Telephone Encounter (Signed)
(  KeyLB:[id number])   Your information has been submitted to [facility]. To check for an updated outcome later, reopen this PA request from your dashboard.  If [employer] has not responded to your request within 24 hours, contact [place] at [phone]. If you think there may be a problem with your PA request, use our live chat feature at the bottom right.

## 2022-10-08 MED ORDER — HYDROXYZINE PAMOATE 25 MG PO CAPS: 25.0000 mg | ORAL_CAPSULE | Freq: Three times a day (TID) | ORAL | 2 refills | Status: DC | PRN

## 2022-10-08 NOTE — Telephone Encounter (Signed)
Please let her know that her insurance denied Wegovy but I did say if she does a comprehensive weight program for 6 months that it is a possibility in the future, that is better than most insurance plans.  I would do the healthy weight and wellness as your comprehensive program for at least 6 months and then if she still needs help from there then come back and we can reassess the possibility of the medicine

## 2022-10-08 NOTE — Telephone Encounter (Signed)
Left message making pt aware. 

## 2022-10-08 NOTE — Telephone Encounter (Signed)
Sent hydroxyzine refill for her

## 2022-10-08 NOTE — Telephone Encounter (Signed)
Pt has been made aware and states that Dr. Warrick Parisian has pent in a referral for her for the weight loss program. She agreed to attend that. Advised if she does not get a call in the next 5-7 business days for an appt to call our office to let us know.  Pt states that she discussed with Dr. Warrick Parisian at her appt about refilling hydroxyzine and it has not been sent to her pharmacy. She takes the medication for her anxiety. Pt would like for Dr. Warrick Parisian to send the medication in for her.

## 2022-10-08 NOTE — Addendum Note (Signed)
Addended by: Caryl Pina on: 10/08/2022 08:55 AM   Modules accepted: Orders

## 2022-10-09 ENCOUNTER — Other Ambulatory Visit: Payer: Self-pay | Admitting: Family Medicine

## 2022-10-09 DIAGNOSIS — E669 Obesity, unspecified: Secondary | ICD-10-CM

## 2022-10-09 NOTE — Telephone Encounter (Signed)
WEGOVY 0.25 MG/0.5ML SOAJ  Pharmacy comment: Alternative Requested:PRIOR AUTH DENIED; PLEASE APPEAL TO INSURANCE OR CONSIDER ALTERNATIVE.

## 2022-11-01 ENCOUNTER — Other Ambulatory Visit: Payer: Self-pay | Admitting: Family Medicine

## 2022-11-01 DIAGNOSIS — J45990 Exercise induced bronchospasm: Secondary | ICD-10-CM

## 2022-12-23 ENCOUNTER — Ambulatory Visit (INDEPENDENT_AMBULATORY_CARE_PROVIDER_SITE_OTHER): Payer: BC Managed Care – PPO | Admitting: Nurse Practitioner

## 2022-12-23 ENCOUNTER — Encounter: Payer: Self-pay | Admitting: Nurse Practitioner

## 2022-12-23 VITALS — BP 135/94 | HR 76 | Temp 98.3°F | Ht 67.0 in | Wt 215.0 lb

## 2022-12-23 DIAGNOSIS — I10 Essential (primary) hypertension: Secondary | ICD-10-CM

## 2022-12-23 DIAGNOSIS — E038 Other specified hypothyroidism: Secondary | ICD-10-CM | POA: Diagnosis not present

## 2022-12-23 DIAGNOSIS — E063 Autoimmune thyroiditis: Secondary | ICD-10-CM | POA: Diagnosis not present

## 2022-12-23 DIAGNOSIS — R109 Unspecified abdominal pain: Secondary | ICD-10-CM | POA: Diagnosis not present

## 2022-12-23 LAB — CBC WITH DIFFERENTIAL/PLATELET
Immature Grans (Abs): 0 10*3/uL (ref 0.0–0.1)
Lymphs: 44 %
Neutrophils Absolute: 3.6 10*3/uL (ref 1.4–7.0)
Platelets: 218 10*3/uL (ref 150–450)

## 2022-12-23 LAB — URINALYSIS, ROUTINE W REFLEX MICROSCOPIC
Bilirubin, UA: NEGATIVE
Glucose, UA: NEGATIVE
Ketones, UA: NEGATIVE
Leukocytes,UA: NEGATIVE
Nitrite, UA: NEGATIVE
Protein,UA: NEGATIVE
Specific Gravity, UA: 1.01 (ref 1.005–1.030)
Urobilinogen, Ur: 0.2 mg/dL (ref 0.2–1.0)
pH, UA: 7 (ref 5.0–7.5)

## 2022-12-23 LAB — MICROSCOPIC EXAMINATION
Bacteria, UA: NONE SEEN
RBC, Urine: NONE SEEN /hpf (ref 0–2)
Renal Epithel, UA: NONE SEEN /hpf
WBC, UA: NONE SEEN /hpf (ref 0–5)

## 2022-12-23 LAB — CMP14+EGFR

## 2022-12-23 LAB — THYROID PANEL WITH TSH

## 2022-12-23 MED ORDER — LISINOPRIL 5 MG PO TABS: 5.0000 mg | ORAL_TABLET | Freq: Every day | ORAL | 3 refills | Status: DC

## 2022-12-23 MED ORDER — KETOROLAC TROMETHAMINE 10 MG PO TABS
10.0000 mg | ORAL_TABLET | Freq: Four times a day (QID) | ORAL | 0 refills | Status: DC | PRN
Start: 2022-12-23 — End: 2023-03-31

## 2022-12-23 NOTE — Progress Notes (Signed)
Acute Office Visit  Subjective:     Patient ID: Betty Hayes, female    DOB: 09/05/1985, 37 y.o.   MRN: [id number]  Chief Complaint  Patient presents with   Abdominal Pain    Been going on for about 2 months. Pain waking her up from sleep. Hurts with movement. Has taken tylenol but has not helped.     HPI Betty Hayes is 37 yrs. old female seen today  for 42-months history of right flank pain. She has PMH of Hashimoto, GERD,follicular lymphoma.and renal calculi. She reports constant dull pain that is worst with prolonged standing. 8/10 pain when she woke in the middle of night and trying to turn. Denies chest pain, Tried OTC ibuprofen with no relief. She reports that she has been exercise 5-6 times weekly, as she is trying to lose weight. We check urinalysis to r/o kidney stones.  BP has been high from last visit and again today. " It has ben running high for my last, visit with my endocrinology. She reports family history of HTN (mother). Discuss starting he ron medications she agrees as she has already tried diet and exercise.  UA: + trace of blood Client requested to have labs done    ROS Negative unless indicated in HPI    Objective:    BP (!) 135/94   Pulse 76   Temp 98.3 F (36.8 C) (Temporal)   Ht 5\' 7"  (1.702 m)   Wt 215 lb (97.5 kg)   SpO2 95%   BMI 33.67 kg/m  BP Readings from Last 3 Encounters:  12/23/22 (!) 135/94  10/02/22 (!) 139/91  08/18/22 122/84   Wt Readings from Last 3 Encounters:  12/23/22 215 lb (97.5 kg)  10/02/22 210 lb (95.3 kg)  08/18/22 204 lb 9.6 oz (92.8 kg)      Physical Exam Vitals and nursing note reviewed.  Constitutional:      Appearance: She is overweight.  HENT:     Head: Normocephalic and atraumatic.  Eyes:     Extraocular Movements: Extraocular movements intact.     Pupils: Pupils are equal, round, and reactive to light.  Cardiovascular:     Rate and Rhythm: Normal rate and regular rhythm.  Pulmonary:      Effort: Pulmonary effort is normal.     Breath sounds: Normal breath sounds.  Abdominal:     General: Bowel sounds are normal.     Palpations: Abdomen is soft. There is no mass.     Tenderness: There is abdominal tenderness. There is no right CVA tenderness, guarding or rebound.  Musculoskeletal:        General: Normal range of motion.     Cervical back: Normal range of motion and neck supple. No tenderness.  Skin:    General: Skin is warm and dry.     Findings: No rash.  Neurological:     General: No focal deficit present.     Mental Status: She is alert and oriented to person, place, and time. Mental status is at baseline.  Psychiatric:        Mood and Affect: Mood normal.        Behavior: Behavior normal. Behavior is cooperative.        Thought Content: Thought content normal.        Judgment: Judgment normal.     No results found for any visits on 12/23/22.      Assessment & Plan:  Right sided abdominal pain -  Urinalysis, Routine w reflex microscopic -     CMP14+EGFR -     Ketorolac Tromethamine; Take 1 tablet (10 mg total) by mouth every 6 (six) hours as needed for moderate pain.  Dispense: 30 tablet; Refill: 0  HTN, goal below 130/80 -     Lisinopril; Take 1 tablet (5 mg total) by mouth daily.  Dispense: 90 tablet; Refill: 3 -     CBC with Differential/Platelet -     CMP14+EGFR  Hypothyroidism due to Hashimoto's thyroiditis -     Thyroid Panel With TSH  Plan New dx of HTN, start Lisinopril 10 mg 1-tab daily - Abdominal pian Toradol 10 mg 1-tab Q8hrs PRN Plan of care discussed with her Return if symptoms worsen or fail to improve.  [address] Santa Lighter DNP

## 2022-12-24 ENCOUNTER — Encounter: Payer: Self-pay | Admitting: Nurse Practitioner

## 2022-12-24 LAB — CBC WITH DIFFERENTIAL/PLATELET
Basophils Absolute: 0.1 10*3/uL (ref 0.0–0.2)
Basos: 1 %
EOS (ABSOLUTE): 0.2 10*3/uL (ref 0.0–0.4)
Eos: 3 %
Hematocrit: 41.2 % (ref 34.0–46.6)
Hemoglobin: 13.7 g/dL (ref 11.1–15.9)
Immature Granulocytes: 0 %
Lymphocytes Absolute: 3.5 10*3/uL — ABNORMAL HIGH (ref 0.7–3.1)
MCH: 27.5 pg (ref 26.6–33.0)
MCHC: 33.3 g/dL (ref 31.5–35.7)
MCV: 83 fL (ref 79–97)
Monocytes Absolute: 0.6 10*3/uL (ref 0.1–0.9)
Monocytes: 8 %
Neutrophils: 44 %
RBC: 4.98 x10E6/uL (ref 3.77–5.28)
RDW: 12.9 % (ref 11.7–15.4)
WBC: 8.1 10*3/uL (ref 3.4–10.8)

## 2022-12-24 LAB — CMP14+EGFR
ALT: 18 IU/L (ref 0–32)
AST: 21 IU/L (ref 0–40)
Albumin: 4.5 g/dL (ref 3.9–4.9)
Alkaline Phosphatase: 89 IU/L (ref 44–121)
BUN/Creatinine Ratio: 17 (ref 9–23)
BUN: 12 mg/dL (ref 6–20)
Bilirubin Total: 0.6 mg/dL (ref 0.0–1.2)
CO2: 23 mmol/L (ref 20–29)
Calcium: 9.5 mg/dL (ref 8.7–10.2)
Chloride: 103 mmol/L (ref 96–106)
Creatinine, Ser: 0.7 mg/dL (ref 0.57–1.00)
Globulin, Total: 2.5 g/dL (ref 1.5–4.5)
Glucose: 80 mg/dL (ref 70–99)
Sodium: 139 mmol/L (ref 134–144)
Total Protein: 7 g/dL (ref 6.0–8.5)
eGFR: 113 mL/min/{1.73_m2} (ref >59)

## 2022-12-24 LAB — THYROID PANEL WITH TSH
Free Thyroxine Index: 2.2 (ref 1.2–4.9)
T3 Uptake Ratio: 27 % (ref 24–39)
T4, Total: 8.1 ug/dL (ref 4.5–12.0)

## 2023-01-06 ENCOUNTER — Ambulatory Visit: Payer: BC Managed Care – PPO

## 2023-01-06 ENCOUNTER — Ambulatory Visit: Payer: BC Managed Care – PPO | Admitting: Nurse Practitioner

## 2023-01-06 ENCOUNTER — Encounter: Payer: Self-pay | Admitting: Nurse Practitioner

## 2023-01-06 VITALS — BP 127/75 | HR 72 | Temp 98.1°F | Ht 67.0 in | Wt 215.6 lb

## 2023-01-06 DIAGNOSIS — L259 Unspecified contact dermatitis, unspecified cause: Secondary | ICD-10-CM

## 2023-01-06 DIAGNOSIS — T63444A Toxic effect of venom of bees, undetermined, initial encounter: Secondary | ICD-10-CM | POA: Diagnosis not present

## 2023-01-06 MED ORDER — METHYLPREDNISOLONE 4 MG PO TBPK
ORAL_TABLET | ORAL | 0 refills | Status: DC
Start: 2023-01-06 — End: 2023-03-31

## 2023-01-06 MED ORDER — AMOXICILLIN 500 MG PO CAPS
500.0000 mg | ORAL_CAPSULE | Freq: Two times a day (BID) | ORAL | 0 refills | Status: AC
Start: 2023-01-06 — End: 2023-01-16

## 2023-01-06 NOTE — Progress Notes (Signed)
   Acute Office Visit  Subjective:     Patient ID: Betty Hayes, female    DOB: 12-14-1985, 37 y.o.   MRN: [id number]  Chief Complaint  Patient presents with   Insect Bite    To right foot, been there since Sunday. Red, swollen, and painful. Has used bite cream, benedryl cream, has taken benedryl, soaked in epson salt, nothing helping    HPI Betty Hayes is 37 yrs old female here today for an acute visit complaining of  bug bite right foot. " I was outside doing yard work and later notice swelling on right foot". She denies fever, malaise or fatigue.  ROS Negative unless indicated in HPI    Objective:    BP 127/75   Pulse 72   Temp 98.1 F (36.7 C) (Temporal)   Ht 5\' 7"  (1.702 m)   Wt 215 lb 9.6 oz (97.8 kg)   SpO2 98%   BMI 33.77 kg/m  BP Readings from Last 3 Encounters:  01/06/23 127/75  12/23/22 (!) 135/94  10/02/22 (!) 139/91   Wt Readings from Last 3 Encounters:  01/06/23 215 lb 9.6 oz (97.8 kg)  12/23/22 215 lb (97.5 kg)  10/02/22 210 lb (95.3 kg)      Physical Exam Vitals and nursing note reviewed.  Constitutional:      Appearance: Normal appearance.  HENT:     Head: Normocephalic and atraumatic.  Cardiovascular:     Rate and Rhythm: Normal rate.     Heart sounds: Normal heart sounds.  Pulmonary:     Effort: Pulmonary effort is normal. No respiratory distress.     Breath sounds: Normal breath sounds.  Musculoskeletal:     Right lower leg: Edema present.     Right foot: Normal capillary refill. Swelling and tenderness present. No crepitus.     Left foot: Normal.     Comments: Foot warm to touch  Skin:    General: Skin is warm and dry.     Findings: No rash.  Neurological:     General: No focal deficit present.     Mental Status: She is alert and oriented to person, place, and time. Mental status is at baseline.  Psychiatric:        Mood and Affect: Mood normal.        Behavior: Behavior normal.        Thought Content: Thought content normal.         Judgment: Judgment normal.     No results found for any visits on 01/06/23.      Assessment & Plan:  Contact dermatitis of foot -     Amoxicillin; Take 1 capsule (500 mg total) by mouth 2 (two) times daily for 10 days.  Dispense: 10 capsule; Refill: 0  Bee sting, undetermined intent, initial encounter -     Amoxicillin; Take 1 capsule (500 mg total) by mouth 2 (two) times daily for 10 days.  Dispense: 10 capsule; Refill: 0 -     methylPREDNISolone; Follow instruction on the box  Dispense: 1 each; Refill: 0  Bee sting Medrol dose pack 4 mg #21 tabs Amoxicillin 500 mg #10 for 5-days Wear long pants and long when gardening Wears bug repellent   Return if symptoms worsen or fail to improve.  [address] Santa Lighter DNP

## 2023-01-14 ENCOUNTER — Encounter (INDEPENDENT_AMBULATORY_CARE_PROVIDER_SITE_OTHER): Payer: Self-pay

## 2023-02-09 ENCOUNTER — Encounter (INDEPENDENT_AMBULATORY_CARE_PROVIDER_SITE_OTHER): Payer: BC Managed Care – PPO | Admitting: Physician Assistant

## 2023-02-12 DIAGNOSIS — Z0289 Encounter for other administrative examinations: Secondary | ICD-10-CM

## 2023-02-17 ENCOUNTER — Ambulatory Visit (INDEPENDENT_AMBULATORY_CARE_PROVIDER_SITE_OTHER): Payer: BC Managed Care – PPO | Admitting: Physician Assistant

## 2023-02-17 ENCOUNTER — Encounter (INDEPENDENT_AMBULATORY_CARE_PROVIDER_SITE_OTHER): Payer: Self-pay | Admitting: Physician Assistant

## 2023-02-17 VITALS — BP 113/79 | HR 81 | Temp 98.5°F | Ht 66.0 in | Wt 213.0 lb

## 2023-02-17 DIAGNOSIS — E6609 Other obesity due to excess calories: Secondary | ICD-10-CM

## 2023-02-17 DIAGNOSIS — C8209 Follicular lymphoma grade I, extranodal and solid organ sites: Secondary | ICD-10-CM | POA: Diagnosis not present

## 2023-02-17 DIAGNOSIS — E063 Autoimmune thyroiditis: Secondary | ICD-10-CM

## 2023-02-17 DIAGNOSIS — Z6834 Body mass index (BMI) 34.0-34.9, adult: Secondary | ICD-10-CM

## 2023-02-17 DIAGNOSIS — E038 Other specified hypothyroidism: Secondary | ICD-10-CM | POA: Diagnosis not present

## 2023-02-17 NOTE — Progress Notes (Signed)
Office: [phone]  /  Fax: [phone]   Initial Visit  Betty Hayes was seen in clinic today to evaluate for obesity. She is interested in losing weight to improve overall health and reduce the risk of weight related complications. She presents today to review program treatment options, initial physical assessment, and evaluation.     She was referred by: PCP  When asked what else they would like to accomplish? She states: Adopt healthier eating patterns, Improve energy levels and physical activity, Improve existing medical conditions, Reduce number of medications, Improve quality of life, and Improve appearance  Weight history: Recent increase in weight over the past year in particular. Has hashimoto's thyroid disease with hypothyroidism. Also underwent immunotherapy in 2021 for follicular lymphoma of small bowel.   When asked how has your weight affected you? She states: Has affected self-esteem, Contributed to medical problems, Having fatigue, Having poor endurance, and Has affected mood   Some associated conditions: Hypertension and Other: Hypothyroidism from hashimoto's thyroiditis  Contributing factors: Medications and Stress  Weight promoting medications identified: None  Current nutrition plan: None  Current level of physical activity: NEAT  Current or previous pharmacotherapy: GLP-1- PCP prescribed Wegovy  Response to medication: Never tried medications as insurance did not cover GNFAOZ.    Past medical history includes:   Past Medical History:  Diagnosis Date   Allergy    Cancer (HCC) 06/2019   follicular lymphoma   Dysphagia    GERD (gastroesophageal reflux disease)    Thyroid disease    Hypothyroidism   Thyroid nodule 2020     Objective:   BP 113/79   Pulse 81   Temp 98.5 F (36.9 C)   Ht 5\' 6"  (1.676 m)   Wt 213 lb (96.6 kg)   SpO2 97%   BMI 34.38 kg/m  She was weighed on the bioimpedance scale: Body mass index is 34.38 kg/m.  Peak  Weight:215 lbs , Body Fat%:41.3%, Visceral Fat Rating:9, Weight trend over the last 12 months: Increasing  General:  Alert, oriented and cooperative. Patient is in no acute distress.  Respiratory: Normal respiratory effort, no problems with respiration noted   Gait: able to ambulate independently  Mental Status: Normal mood and affect. Normal behavior. Normal judgment and thought content.   DIAGNOSTIC DATA REVIEWED:  BMET    Component Value Date/Time   NA 139 12/23/2022 1244   K 4.5 12/23/2022 1244   CL 103 12/23/2022 1244   CO2 23 12/23/2022 1244   GLUCOSE 80 12/23/2022 1244   GLUCOSE 106 (H) 08/11/2022 0814   BUN 12 12/23/2022 1244   CREATININE 0.70 12/23/2022 1244   CALCIUM 9.5 12/23/2022 1244   GFRNONAA >60 08/11/2022 0814   GFRAA >60 01/24/2020 0814   Lab Results  Component Value Date   HGBA1C 5.0 08/15/2016   No results found for: "INSULIN" CBC    Component Value Date/Time   WBC 8.1 12/23/2022 1244   WBC 7.0 08/11/2022 0814   RBC 4.98 12/23/2022 1244   RBC 4.63 08/11/2022 0814   HGB 13.7 12/23/2022 1244   HCT 41.2 12/23/2022 1244   PLT 218 12/23/2022 1244   MCV 83 12/23/2022 1244   MCH 27.5 12/23/2022 1244   MCH 27.6 08/11/2022 0814   MCHC 33.3 12/23/2022 1244   MCHC 32.9 08/11/2022 0814   RDW 12.9 12/23/2022 1244   Iron/TIBC/Ferritin/ %Sat No results found for: "IRON", "TIBC", "FERRITIN", "IRONPCTSAT" Lipid Panel     Component Value Date/Time   CHOL 172 04/07/2022  1541   TRIG 95 04/07/2022 1541   HDL 63 04/07/2022 1541   CHOLHDL 2.7 04/07/2022 1541   LDLCALC 92 04/07/2022 1541   Hepatic Function Panel     Component Value Date/Time   PROT 7.0 12/23/2022 1244   ALBUMIN 4.5 12/23/2022 1244   AST 21 12/23/2022 1244   ALT 18 12/23/2022 1244   ALKPHOS 89 12/23/2022 1244   BILITOT 0.6 12/23/2022 1244      Component Value Date/Time   TSH 0.757 12/23/2022 1244     Assessment and Plan:   Hypothyroidism due to Hashimoto's thyroiditis  Grade I  follicular lymphoma of extranodal site excluding spleen and other solid organs (HCC)  Class 1 obesity due to excess calories with serious comorbidity and body mass index (BMI) of 34.0 to 34.9 in adult        Obesity Treatment / Action Plan:  Patient will work on garnering support from family and friends to begin weight loss journey. Will work on eliminating or reducing the presence of highly palatable, calorie dense foods in the home. Will complete provided nutritional and psychosocial assessment questionnaire before the next appointment. Will be scheduled for indirect calorimetry to determine resting energy expenditure in a fasting state.  This will allow [place] to create a reduced calorie, high-protein meal plan to promote loss of fat mass while preserving muscle mass. Will think about ideas on how to incorporate physical activity into their daily routine. Will avoid skipping meals which may result in increased hunger signals and overeating at certain times. Will reduce the frequency of eating out and making healthier choices by advanced menu planning. Counseled on the health benefits of losing 5%-15% of total body weight. Was counseled on nutritional approaches to weight loss and benefits of reducing processed foods and consuming plant-based foods and high quality protein as part of nutritional weight management. Was counseled on pharmacotherapy and role as an adjunct in weight management.   Obesity Education Performed Today:  She was weighed on the bioimpedance scale and results were discussed and documented in the synopsis.  We discussed obesity as a disease and the importance of a more detailed evaluation of all the factors contributing to the disease.  We discussed the importance of long term lifestyle changes which include nutrition, exercise and behavioral modifications as well as the importance of customizing this to her specific health and social needs.  We discussed the benefits  of reaching a healthier weight to alleviate the symptoms of existing conditions and reduce the risks of the biomechanical, metabolic and psychological effects of obesity.  Betty Hayes appears to be in the action stage of change and states they are ready to start intensive lifestyle modifications and behavioral modifications.  30 minutes was spent today on this visit including the above counseling, pre-visit chart review, and post-visit documentation.  Reviewed by clinician on day of visit: allergies, medications, problem list, medical history, surgical history, family history, social history, and previous encounter notes pertinent to obesity diagnosis.   Kindle Strohmeier,PA-C

## 2023-03-06 ENCOUNTER — Encounter: Payer: Self-pay | Admitting: Nurse Practitioner

## 2023-03-06 ENCOUNTER — Ambulatory Visit (INDEPENDENT_AMBULATORY_CARE_PROVIDER_SITE_OTHER): Payer: BC Managed Care – PPO | Admitting: Nurse Practitioner

## 2023-03-06 VITALS — BP 118/79 | HR 89 | Temp 97.9°F | Resp 20 | Ht 66.0 in | Wt 218.0 lb

## 2023-03-06 DIAGNOSIS — J029 Acute pharyngitis, unspecified: Secondary | ICD-10-CM

## 2023-03-06 LAB — CULTURE, GROUP A STREP

## 2023-03-06 LAB — RAPID STREP SCREEN (MED CTR MEBANE ONLY): Strep Gp A Ag, IA W/Reflex: NEGATIVE

## 2023-03-06 NOTE — Progress Notes (Signed)
Subjective:    Patient ID: Betty Hayes, female    DOB: 1986/04/11, 37 y.o.   MRN: [id number]   Chief Complaint: Sore Throat   Sore Throat  This is a new problem. The current episode started yesterday. The problem has been waxing and waning. Neither side of throat is experiencing more pain than the other. There has been no fever. The pain is at a severity of 7/10. The pain is moderate. Associated symptoms include coughing and trouble swallowing. Pertinent negatives include no congestion, headaches, shortness of breath or swollen glands. She has had no exposure to strep or mono. She has tried acetaminophen for the symptoms. The treatment provided mild relief.    Patient Active Problem List   Diagnosis Date Noted   Contact dermatitis of foot 01/06/2023   HTN, goal below 130/80 12/23/2022   Right sided abdominal pain 12/23/2022   Abdominal pain 05/24/2020   Rectal bleeding 05/24/2020   Nausea without vomiting 02/06/2020   Early satiety 02/06/2020   Diarrhea 02/06/2020   Grade I follicular lymphoma of extranodal site excluding spleen and other solid organs (HCC) 09/29/2019   Goals of care, counseling/discussion 09/29/2019   Follicular lymphoma (HCC) 07/28/2019   GERD (gastroesophageal reflux disease) 03/31/2019   Dysphagia 03/29/2019   Obesity (BMI 30.0-34.9) 12/04/2017   Hashimoto's thyroiditis 05/22/2016   Hypothyroidism 01/03/2016       Review of Systems  HENT:  Positive for trouble swallowing. Negative for congestion.   Respiratory:  Positive for cough. Negative for shortness of breath.   Neurological:  Negative for headaches.       Objective:   Physical Exam Constitutional:      Appearance: She is well-developed.  HENT:     Right Ear: Tympanic membrane normal.     Left Ear: Tympanic membrane normal.     Nose: No congestion or rhinorrhea.     Mouth/Throat:     Pharynx: Posterior oropharyngeal erythema (very mild) present. No oropharyngeal exudate.   Cardiovascular:     Rate and Rhythm: Normal rate and regular rhythm.     Pulses: Normal pulses.     Heart sounds: Normal heart sounds.  Pulmonary:     Breath sounds: Normal breath sounds.  Musculoskeletal:     Cervical back: Normal range of motion and neck supple.  Lymphadenopathy:     Cervical: No cervical adenopathy.  Skin:    General: Skin is warm.  Neurological:     General: No focal deficit present.     Mental Status: She is alert and oriented to person, place, and time.  Psychiatric:        Mood and Affect: Mood normal.        Behavior: Behavior normal.    BP 118/79   Pulse 89   Temp 97.9 F (36.6 C) (Temporal)   Resp 20   Ht 5\' 6"  (1.676 m)   Wt 218 lb (98.9 kg)   SpO2 95%   BMI 35.19 kg/m    Strep negative     Assessment & Plan:   Betty Hayes in today with chief complaint of Sore Throat   1. Sore throat Force fluids Motrin or tylenol OTC OTC decongestant Throat lozenges if help New toothbrush in 3 days  - Novel Coronavirus, NAA ([facility]) - Rapid Strep Screen ([facility] ONLY) Covid results pending   The above assessment and management plan was discussed with the patient. The patient verbalized understanding of and has agreed to the management plan. Patient  is aware to call the clinic if symptoms persist or worsen. Patient is aware when to return to the clinic for a follow-up visit. Patient educated on when it is appropriate to go to the emergency department.   Mary-Margaret Daphine Deutscher, FNP

## 2023-03-07 LAB — NOVEL CORONAVIRUS, NAA: SARS-CoV-2, NAA: NOT DETECTED

## 2023-03-24 ENCOUNTER — Ambulatory Visit (INDEPENDENT_AMBULATORY_CARE_PROVIDER_SITE_OTHER): Payer: BC Managed Care – PPO | Admitting: Internal Medicine

## 2023-03-31 ENCOUNTER — Encounter (INDEPENDENT_AMBULATORY_CARE_PROVIDER_SITE_OTHER): Payer: Self-pay | Admitting: Family Medicine

## 2023-03-31 ENCOUNTER — Ambulatory Visit (INDEPENDENT_AMBULATORY_CARE_PROVIDER_SITE_OTHER): Payer: BC Managed Care – PPO | Admitting: Family Medicine

## 2023-03-31 VITALS — BP 124/82 | HR 73 | Temp 99.1°F | Ht 66.0 in | Wt 218.0 lb

## 2023-03-31 DIAGNOSIS — R5383 Other fatigue: Secondary | ICD-10-CM | POA: Diagnosis not present

## 2023-03-31 DIAGNOSIS — R1319 Other dysphagia: Secondary | ICD-10-CM | POA: Diagnosis not present

## 2023-03-31 DIAGNOSIS — I10 Essential (primary) hypertension: Secondary | ICD-10-CM | POA: Diagnosis not present

## 2023-03-31 DIAGNOSIS — E669 Obesity, unspecified: Secondary | ICD-10-CM | POA: Diagnosis not present

## 2023-03-31 DIAGNOSIS — E038 Other specified hypothyroidism: Secondary | ICD-10-CM | POA: Diagnosis not present

## 2023-03-31 DIAGNOSIS — E063 Autoimmune thyroiditis: Secondary | ICD-10-CM | POA: Diagnosis not present

## 2023-03-31 DIAGNOSIS — R0602 Shortness of breath: Secondary | ICD-10-CM

## 2023-04-02 LAB — CBC WITH DIFFERENTIAL/PLATELET
Basophils Absolute: 0.1 10*3/uL (ref 0.0–0.2)
Basos: 1 %
EOS (ABSOLUTE): 0.2 10*3/uL (ref 0.0–0.4)
Eos: 3 %
Hematocrit: 40.2 % (ref 34.0–46.6)
Hemoglobin: 13 g/dL (ref 11.1–15.9)
Immature Grans (Abs): 0 10*3/uL (ref 0.0–0.1)
Immature Granulocytes: 0 %
Lymphocytes Absolute: 3 10*3/uL (ref 0.7–3.1)
Lymphs: 38 %
MCH: 27.8 pg (ref 26.6–33.0)
MCHC: 32.3 g/dL (ref 31.5–35.7)
MCV: 86 fL (ref 79–97)
Monocytes Absolute: 0.6 10*3/uL (ref 0.1–0.9)
Monocytes: 7 %
Neutrophils Absolute: 4 10*3/uL (ref 1.4–7.0)
Neutrophils: 51 %
Platelets: 216 10*3/uL (ref 150–450)
RBC: 4.68 x10E6/uL (ref 3.77–5.28)
RDW: 13.2 % (ref 11.7–15.4)
WBC: 7.9 10*3/uL (ref 3.4–10.8)

## 2023-04-02 LAB — COMPREHENSIVE METABOLIC PANEL
ALT: 16 IU/L (ref 0–32)
AST: 21 IU/L (ref 0–40)
Albumin: 4.5 g/dL (ref 3.9–4.9)
Alkaline Phosphatase: 84 IU/L (ref 44–121)
BUN/Creatinine Ratio: 19 (ref 9–23)
BUN: 14 mg/dL (ref 6–20)
Bilirubin Total: 0.6 mg/dL (ref 0.0–1.2)
CO2: 20 mmol/L (ref 20–29)
Calcium: 9 mg/dL (ref 8.7–10.2)
Chloride: 104 mmol/L (ref 96–106)
Creatinine, Ser: 0.73 mg/dL (ref 0.57–1.00)
Globulin, Total: 2.2 g/dL (ref 1.5–4.5)
Glucose: 85 mg/dL (ref 70–99)
Potassium: 4.5 mmol/L (ref 3.5–5.2)
Sodium: 140 mmol/L (ref 134–144)
Total Protein: 6.7 g/dL (ref 6.0–8.5)
eGFR: 107 mL/min/{1.73_m2} (ref >59)

## 2023-04-02 LAB — LIPID PANEL WITH LDL/HDL RATIO
Cholesterol, Total: 177 mg/dL (ref 100–199)
HDL: 63 mg/dL (ref >39)
LDL Chol Calc (NIH): 102 mg/dL — ABNORMAL HIGH (ref 0–99)
LDL/HDL Ratio: 1.6 ratio (ref 0.0–3.2)
Triglycerides: 61 mg/dL (ref 0–149)
VLDL Cholesterol Cal: 12 mg/dL (ref 5–40)

## 2023-04-02 LAB — FOLATE: Folate: 12.2 ng/mL (ref >3.0)

## 2023-04-02 LAB — HEMOGLOBIN A1C
Est. average glucose Bld gHb Est-mCnc: 111 mg/dL
Hgb A1c MFr Bld: 5.5 % (ref 4.8–5.6)

## 2023-04-02 LAB — TSH: TSH: 0.4 u[IU]/mL — ABNORMAL LOW (ref 0.450–4.500)

## 2023-04-02 LAB — T4, FREE: Free T4: 1.32 ng/dL (ref 0.82–1.77)

## 2023-04-02 LAB — INSULIN, RANDOM: INSULIN: 18 u[IU]/mL (ref 2.6–24.9)

## 2023-04-02 LAB — VITAMIN D 25 HYDROXY (VIT D DEFICIENCY, FRACTURES): Vit D, 25-Hydroxy: 30.3 ng/mL (ref 30.0–100.0)

## 2023-04-02 LAB — VITAMIN B12: Vitamin B-12: 467 pg/mL (ref 232–1245)

## 2023-04-14 ENCOUNTER — Encounter (INDEPENDENT_AMBULATORY_CARE_PROVIDER_SITE_OTHER): Payer: Self-pay | Admitting: Family Medicine

## 2023-04-14 ENCOUNTER — Ambulatory Visit (INDEPENDENT_AMBULATORY_CARE_PROVIDER_SITE_OTHER): Payer: BC Managed Care – PPO | Admitting: Family Medicine

## 2023-04-14 VITALS — BP 125/85 | HR 75 | Temp 98.7°F | Ht 66.0 in | Wt 220.0 lb

## 2023-04-14 DIAGNOSIS — E6609 Other obesity due to excess calories: Secondary | ICD-10-CM

## 2023-04-14 DIAGNOSIS — I10 Essential (primary) hypertension: Secondary | ICD-10-CM | POA: Diagnosis not present

## 2023-04-14 DIAGNOSIS — Z1331 Encounter for screening for depression: Secondary | ICD-10-CM

## 2023-04-14 DIAGNOSIS — Z6834 Body mass index (BMI) 34.0-34.9, adult: Secondary | ICD-10-CM

## 2023-04-14 DIAGNOSIS — E559 Vitamin D deficiency, unspecified: Secondary | ICD-10-CM

## 2023-04-14 DIAGNOSIS — R0602 Shortness of breath: Secondary | ICD-10-CM | POA: Diagnosis not present

## 2023-04-14 DIAGNOSIS — E038 Other specified hypothyroidism: Secondary | ICD-10-CM

## 2023-04-14 DIAGNOSIS — R5383 Other fatigue: Secondary | ICD-10-CM

## 2023-04-14 DIAGNOSIS — F39 Unspecified mood [affective] disorder: Secondary | ICD-10-CM

## 2023-04-14 DIAGNOSIS — E063 Autoimmune thyroiditis: Secondary | ICD-10-CM

## 2023-04-14 NOTE — Progress Notes (Signed)
Betty Hayes, D.O.  ABFM, ABOM Specializing in Clinical Bariatric Medicine Office located at: [address]  [place], [place]  [address]     Bariatric Medicine Visit  Dear Betty Hayes, Betty Radon, MD   Thank you for referring Betty Hayes to our clinic today for evaluation.  We performed a consultation to discuss her options for treatment and educate the patient on her disease state.  The following note includes my evaluation and treatment recommendations.   Please do not hesitate to reach out to me directly if you have any further concerns.   Assessment and Plan:   Review labs obtained on 9/10 at next OV   Fatigue Assessment & Plan: Kynslei does feel that her weight is causing her energy to be lower than it should be. Fatigue may be related to obesity, depression or many other causes. she does not appear to have any red flag symptoms and this appears to most likely be related to her current lifestyle habits and dietary intake.  Labs will be ordered and reviewed with her at their next office visit in two weeks.  Epworth sleepiness scale is 2 & appears to be within normal limits.  Anetha denies daytime somnolence and admits to waking up still tired. Sleeps from 9:30 pm to 6:35 am; reports waking up every hour on most nights. Snoring is present. Apneic episodes is not present.   ECG obtained on 03/31/23 : Performed and reviewed/ interpreted independently.  Normal sinus rhythm, rate 73 bpm; reassuring without any acute abnormalities, will continue to monitor for symptoms    Shortness of breath on exertion Assessment & Plan: Hebah does feel that she gets out of breath more easily than she used to when she exercises and seems to be worsening over time with weight gain.  This has gotten worse recently. Lillan denies shortness of breath at rest or orthopnea. Aveleen's shortness of breath appears to be obesity related and exercise induced, as they do not appear to have any "red flag"  symptoms/ concerns today.  Also, this condition appears to be related to a state of poor cardiovascular conditioning   Obtain labs today and will be reviewed with her at their next office visit in two weeks.  Indirect Calorimeter completed today to help guide our dietary regimen. It shows a VO2 of 234 and a REE of 1613. Her calculated basal metabolic rate is 4401 thus her measured basal metabolic rate is worse than expected.  Patient agreed to work on weight loss at this time.  As Dorothe progresses through our weight loss program, we will gradually increase exercise as tolerated to treat her current condition.   If Betty Hayes follows our recommendations and loses 5-10% of their weight without improvement of her shortness of breath or if at any time, symptoms become more concerning, they agree to urgently follow up with their PCP/ specialist for further consideration/ evaluation.   Royann verbalizes agreement with this plan.    Depression screening Her Food and Mood (modified PHQ-9) score was 8. No concerns in this regard.    Mood disorder (HCC) - emotional eating Assessment & Plan: Denies any SI/HI. Takes Vistaril 25 mg once daily. Reports having anxiety secondary to work stress. Feels that anxiety is pretty well controlled. Denies any depression. Tends to eat when stressed or bored. Patient was referred to Dr. Dewaine Conger, our Bariatric Psychologist, due to some challenges with emotional eating. C/w Vistaril per PCP and we will begin to monitor closely alongside PCP.  HTN, goal below 130/80 Assessment & Plan: BP Readings from Last 3 Encounters:  04/14/23 125/85  03/31/23 124/82  03/06/23 118/79   Pt reports having a fhx of HTN on her maternal side. HTN treated with Zestril 5 mg once daily; reports starting the medication a few months ago per NP Richardson Landry. Usually does not check blood pressure at home.   Blood pressure stable today. No concerns in this regard. C/w antihypertensive at current  dose and begin prudent nutritional plan and low sodium diet, advance exercise as tolerated.     Hypothyroidism due to Hashimoto's thyroiditis Assessment & Plan: Pt managed by NP Romana Juniper of [facility] - . Has hx of hypothyroidism s/t Hashimoto's thyroiditis and thyroid nodules. She also has hx of follicular lymphoma. Current medication:  Synthroid 88 mcg daily & Cytomel 5 mcg daily. C/w thyroid medications at current dose & f/up with specialist as needed.     Vitamin D deficiency Assessment & Plan: Reports having the diagnosis of vitamin D deficiency. Reports inconsistently taking OTC vitamin D 2,000 international units once daily. C/w current supplementation regiment. Will review labs next OV.    Class 1 obesity due to excess calories with serious comorbidity and body mass index (BMI) of 34.0 to 34.9 in adult Assessment & Plan: Muscle mass is 118.4 lbs. Fat mass is 95.2 lbs. Total body water is 84.4 lbs.  Gael will work on healthier eating habits and try their best to follow the Category 2 meal plan with 100 snack calories.   Behavioral Intervention Additional resources provided today: category 2 meal plan information Evidence-based interventions for health behavior change were utilized today including the discussion of self monitoring techniques, problem-solving barriers and SMART goal setting techniques.   Regarding patient's less desirable eating habits and patterns, we employed the technique of small changes.  Pt will specifically work on: beginning prescribed meal plan for next visit.    FOLLOW UP: Follow up in 2 weeks. She was informed of the importance of frequent follow up visits to maximize her success with intensive lifestyle modifications for her multiple health conditions.  Carlis S Frees is aware that we will review all of her lab results at our next visit.  She is aware that if anything is critical/ life threatening with the results, we will be contacting her via MyChart  prior to the office visit to discuss management.    Chief Complaint:   OBESITY Betty Hayes (MR# [id number]) is a pleasant 37 y.o. female who presents for evaluation and treatment of obesity and related comorbidities. Current BMI is Body mass index is 35.51 kg/m. Giavanna S Mabile has been struggling with her weight for many years and has been unsuccessful in either losing weight, maintaining weight loss, or reaching her healthy weight goal.  Robbie Lis Tibbett is currently in the action stage of change and ready to dedicate time achieving and maintaining a healthier weight. Robbie Lis Boehner is interested in becoming our patient and working on intensive lifestyle modifications including (but not limited to) diet and exercise for weight loss.  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 Patient is married to Shelburn   and has 2 children (17 y.o daughter and 48 y.o son). She lives with her husband and 2 children.   Thinks weight gain could be from thyroid issues.   Desires to be 150 lbs in one year or so.   Exercises (walking & weights) 30 minutes, 2x a wk.  Has never been on a diet. "Does not believe in diets"   Eats outside the home twice a wk.   Craves salty sweet.   "Sometimes will snack on anything"  Does not skip any meals.   Drinks several caloric beverages: coffee with coconut milk and sugar, juice, tea with honey, smoothies, protein shakes. Has alcohol only a couple times a month.   Worst food habit: "binge eating cookies when stressed."   Subjective:   This is the patient's first visit at [facility].  The patient's NEW PATIENT PACKET that they filled out prior to today's office visit was reviewed at length and information from that paperwork was included within the following office visit note.    Included in the packet: current and past health history, medications, allergies, ROS, gynecologic history (women only), surgical history,  family history, social history, weight history, weight loss surgery history (for those that have had weight loss surgery), nutritional evaluation, mood and food questionnaire along with a depression screening (PHQ9) on all patients, an Epworth questionnaire, sleep habits questionnaire, patient life and health improvement goals questionnaire. These will all be scanned into the patient's chart under the "media" tab.   Review of Systems: Please refer to new patient packet scanned into media. Pertinent positives were addressed with patient today.  Reviewed by clinician on day of visit: allergies, medications, problem list, medical history, surgical history, family history, social history, and previous encounter notes.  During the visit, I independently reviewed the patient's EKG, bioimpedance scale results, and indirect calorimeter results. I used this information to tailor a meal plan for the patient that will help Willowdean S App to lose weight and will improve her obesity-related conditions going forward.  I performed a medically necessary appropriate examination and/or evaluation. I discussed the assessment and treatment plan with the patient. The patient was provided an opportunity to ask questions and all were answered. The patient agreed with the plan and demonstrated an understanding of the instructions. Labs were ordered today (unless patient declined them) and will be reviewed with the patient at our next visit unless more critical results need to be addressed immediately. Clinical information was updated and documented in the EMR.   Objective:   PHYSICAL EXAM: Blood pressure 125/85, pulse 75, temperature 98.7 F (37.1 C), height 5\' 6"  (1.676 m), weight 220 lb (99.8 kg), SpO2 98%. Body mass index is 35.51 kg/m. General: Well Developed, well nourished, and in no acute distress.  HEENT: Normocephalic, atraumatic Skin: Warm and dry, cap RF less 2 sec, good turgor Chest:  Normal excursion, shape,  no gross abn Respiratory: speaking in full sentences, no conversational dyspnea NeuroM-Sk: Ambulates w/o assistance, moves * 4 Psych: A and O *3, insight good, mood-full  Anthropometric Measurements Height: 5\' 6"  (1.676 m) Weight: 220 lb (99.8 kg) BMI (Calculated): 35.53 Weight at Last Visit: na Weight Lost Since Last Visit: na Weight Gained Since Last Visit: na Starting Weight: 220lb Total Weight Loss (lbs): 0 lb (0 kg) Peak Weight: 213lb Waist Measurement : 44 inches   Body Composition  Body Fat %: 43.3 % Fat Mass (lbs): 95.2 lbs Muscle Mass (lbs): 118.4 lbs Total Body Water (lbs): 84.4 lbs Visceral Fat Rating : 10   Other Clinical Data RMR: 1613 Fasting: no Labs: no Today's Visit #: 1 Starting Date: 04/14/23 Comments: first visit    DIAGNOSTIC DATA REVIEWED:  BMET    Component Value Date/Time   NA 140 03/31/2023 0820   K 4.5 03/31/2023 0820  CL 104 03/31/2023 0820   CO2 20 03/31/2023 0820   GLUCOSE 85 03/31/2023 0820   GLUCOSE 106 (H) 08/11/2022 0814   BUN 14 03/31/2023 0820   CREATININE 0.73 03/31/2023 0820   CALCIUM 9.0 03/31/2023 0820   GFRNONAA >60 08/11/2022 0814   GFRAA >60 01/24/2020 0814   Lab Results  Component Value Date   HGBA1C 5.5 03/31/2023   HGBA1C 5.0 08/15/2016   Lab Results  Component Value Date   INSULIN 18.0 03/31/2023   Lab Results  Component Value Date   TSH 0.400 (L) 03/31/2023   CBC    Component Value Date/Time   WBC 7.9 03/31/2023 0820   WBC 7.0 08/11/2022 0814   RBC 4.68 03/31/2023 0820   RBC 4.63 08/11/2022 0814   HGB 13.0 03/31/2023 0820   HCT 40.2 03/31/2023 0820   PLT 216 03/31/2023 0820   MCV 86 03/31/2023 0820   MCH 27.8 03/31/2023 0820   MCH 27.6 08/11/2022 0814   MCHC 32.3 03/31/2023 0820   MCHC 32.9 08/11/2022 0814   RDW 13.2 03/31/2023 0820   Iron Studies No results found for: "IRON", "TIBC", "FERRITIN", "IRONPCTSAT" Lipid Panel     Component Value Date/Time   CHOL 177 03/31/2023 0820    TRIG 61 03/31/2023 0820   HDL 63 03/31/2023 0820   CHOLHDL 2.7 04/07/2022 1541   LDLCALC 102 (H) 03/31/2023 0820   Hepatic Function Panel     Component Value Date/Time   PROT 6.7 03/31/2023 0820   ALBUMIN 4.5 03/31/2023 0820   AST 21 03/31/2023 0820   ALT 16 03/31/2023 0820   ALKPHOS 84 03/31/2023 0820   BILITOT 0.6 03/31/2023 0820      Component Value Date/Time   TSH 0.400 (L) 03/31/2023 0820   Nutritional Lab Results  Component Value Date   VD25OH 30.3 03/31/2023    Attestation Statements:   I, Special Puri, acting as a Stage manager for [employer], DO., have compiled all relevant documentation for today's office visit on behalf of Thomasene Lot, DO, while in the presence of [employer], DO.  Time spent on visit including pre-visit chart review and post-visit care was estimated to be 60 minutes. Over 50% of the time was spent in direct face to face counseling and coordination of care.  I have reviewed the above documentation for accuracy and completeness, and I agree with the above. Betty Hayes, D.O.  The 21st Century Cures Act was signed into law in 2016 which includes the topic of electronic health records.  This provides immediate access to information in MyChart.  This includes consultation notes, operative notes, office notes, lab results and pathology reports.  If you have any questions about what you read please let us know at your next visit so we can discuss your concerns and take corrective action if need be.  We are right here with you.

## 2023-04-16 ENCOUNTER — Ambulatory Visit (HOSPITAL_BASED_OUTPATIENT_CLINIC_OR_DEPARTMENT_OTHER)
Admission: RE | Admit: 2023-04-16 | Discharge: 2023-04-16 | Disposition: A | Discharge status: Home / Self Care | Payer: BC Managed Care – PPO | Source: Ambulatory Visit | Attending: Family Medicine | Admitting: Family Medicine

## 2023-04-16 ENCOUNTER — Encounter: Payer: Self-pay | Admitting: Family Medicine

## 2023-04-16 ENCOUNTER — Telehealth: Payer: BC Managed Care – PPO | Admitting: Family Medicine

## 2023-04-16 DIAGNOSIS — R1031 Right lower quadrant pain: Secondary | ICD-10-CM

## 2023-04-16 DIAGNOSIS — C8290 Follicular lymphoma, unspecified, unspecified site: Secondary | ICD-10-CM | POA: Diagnosis not present

## 2023-04-16 DIAGNOSIS — R109 Unspecified abdominal pain: Secondary | ICD-10-CM

## 2023-04-16 DIAGNOSIS — R188 Other ascites: Secondary | ICD-10-CM | POA: Diagnosis not present

## 2023-04-16 DIAGNOSIS — R1011 Right upper quadrant pain: Secondary | ICD-10-CM | POA: Insufficient documentation

## 2023-04-16 DIAGNOSIS — N1330 Unspecified hydronephrosis: Secondary | ICD-10-CM | POA: Diagnosis not present

## 2023-04-16 NOTE — Progress Notes (Signed)
Virtual Visit via MyChart video note  I connected with Betty Hayes on 04/16/23 at 1321 by video and verified that I am speaking with the correct person using two identifiers. Betty Hayes is currently located at home and patient are currently with her during visit. The provider, Elige Radon Cliffie Gingras, MD is located in their office at time of visit.  Call ended at 1337  I discussed the limitations, risks, security and privacy concerns of performing an evaluation and management service by video and the availability of in person appointments. I also discussed with the patient that there may be a patient responsible charge related to this service. The patient expressed understanding and agreed to proceed.   History and Present Illness: Patient is calling in for flank pain and it is still present.  She is drinking plenty of liquids.  It is worse at night and in the morning and is better during the day.  She is having right low back pain more to the axillary line when sitting. She denies dysuria, hematuria or frequency.  She denies bowel issues. She denies association with eating or bowel movements. Worse with rotation slightly. She says it feels like a sharp pain that is moderate in intensity. Near her iliac crest on the right side.  No pain with palpation. This has been going on for 5 months.  Alcohol makes it worse.  She says the pain has been worse at night as well.  1. Right sided abdominal pain   2. Right flank pain   3. Right upper quadrant abdominal pain     Outpatient Encounter Medications as of 04/16/2023  Medication Sig   albuterol (VENTOLIN HFA) 108 (90 Base) MCG/ACT inhaler TAKE 2 PUFFS BY MOUTH EVERY 6 HOURS AS NEEDED FOR WHEEZE OR SHORTNESS OF BREATH   Ascorbic Acid (VITAMIN C PO) Take 1 tablet by mouth daily.   calcium carbonate (OS-CAL - DOSED IN MG OF ELEMENTAL CALCIUM) 1250 (500 Ca) MG tablet Take 1 tablet by mouth daily with breakfast.   Cholecalciferol (VITAMIN D3) 50 MCG  (2000 UT) TABS Take 2,000 Units by mouth daily.   ECHINACEA PO Take 760 mg by mouth at bedtime.   hydrOXYzine (VISTARIL) 25 MG capsule Take 1 capsule (25 mg total) by mouth every 8 (eight) hours as needed.   liothyronine (CYTOMEL) 5 MCG tablet Take 5 mcg by mouth daily.   lisinopril (ZESTRIL) 5 MG tablet Take 1 tablet (5 mg total) by mouth daily.   MAGNESIUM GLUCONATE PO Take 240 mg by mouth at bedtime. 120 mg   pantoprazole (PROTONIX) 40 MG tablet Take 1 tablet (40 mg total) by mouth daily.   PREBIOTIC PRODUCT PO Take 330 mg by mouth daily. enzymes   Selenium 200 MCG CAPS Take 200 mcg by mouth at bedtime.   SYNTHROID 88 MCG tablet Take 88 mcg by mouth daily.   Zinc 23 MG LOZG Take 1 lozenge by mouth daily.   No facility-administered encounter medications on file as of 04/16/2023.    Review of Systems  Constitutional:  Negative for chills and fever.  HENT:  Negative for congestion, ear discharge and ear pain.   Eyes:  Negative for redness and visual disturbance.  Respiratory:  Negative for chest tightness and shortness of breath.   Cardiovascular:  Negative for chest pain and leg swelling.  Gastrointestinal:  Positive for abdominal pain.  Genitourinary:  Positive for flank pain. Negative for difficulty urinating and dysuria.  Musculoskeletal:  Negative for back pain  and gait problem.  Skin:  Negative for rash.  Neurological:  Negative for light-headedness and headaches.  Psychiatric/Behavioral:  Negative for agitation and behavioral problems.   All other systems reviewed and are negative.   Observations/Objective: Patient sounds comfortable and in no acute distress  Assessment and Plan: Problem List Items Addressed This Visit       Other   Abdominal pain   Relevant Orders   CT RENAL STONE STUDY   Right sided abdominal pain - Primary   Relevant Orders   CT RENAL STONE STUDY   Other Visit Diagnoses     Right flank pain       Relevant Orders   CT RENAL STONE STUDY        Will order CT renal stone study because she did have some blood in her urine when she was seen before and they suspected renal stone but I am also suspicious of possible spinal issues so hopefully the scan will take a look at musculoskeletal as well and look at the gallbladder as well. Follow up plan: Return if symptoms worsen or fail to improve.     I discussed the assessment and treatment plan with the patient. The patient was provided an opportunity to ask questions and all were answered. The patient agreed with the plan and demonstrated an understanding of the instructions.   The patient was advised to call back or seek an in-person evaluation if the symptoms worsen or if the condition fails to improve as anticipated.  The above assessment and management plan was discussed with the patient. The patient verbalized understanding of and has agreed to the management plan. Patient is aware to call the clinic if symptoms persist or worsen. Patient is aware when to return to the clinic for a follow-up visit. Patient educated on when it is appropriate to go to the emergency department.    I provided 16 minutes of non-face-to-face time during this encounter.    Nils Pyle, MD

## 2023-04-20 NOTE — Progress Notes (Signed)
On 03/31/23, the clinical staff was directed by their superior administrative personnel to obtain medical labs,perform an IC and EKG on pt and update pt's med list and all their PMHx etc in the chart.   Therefore, the pt was in the office for about one hour or more being evaluated by one of medical staff members, Linton Rump, CMA.   Orders Placed This Encounter  Procedures   CBC with Differential/Platelet   VITAMIN D 25 Hydroxy (Vit-D Deficiency, Fractures)   Comprehensive metabolic panel   Folate   Hemoglobin A1c   Insulin, random   Lipid Panel With LDL/HDL Ratio   T4, free   TSH   Vitamin B12   EKG 12-Lead    Medications Discontinued During This Encounter  Medication Reason   fexofenadine (ALLEGRA) 180 MG tablet    ketorolac (TORADOL) 10 MG tablet    methylPREDNISolone (MEDROL DOSEPAK) 4 MG TBPK tablet    Multiple Vitamins-Minerals (MULTIVITAMIN ADULT PO)    prochlorperazine (COMPAZINE) 10 MG tablet

## 2023-04-28 ENCOUNTER — Ambulatory Visit (INDEPENDENT_AMBULATORY_CARE_PROVIDER_SITE_OTHER): Payer: BC Managed Care – PPO | Admitting: Family Medicine

## 2023-04-28 ENCOUNTER — Encounter (INDEPENDENT_AMBULATORY_CARE_PROVIDER_SITE_OTHER): Payer: Self-pay | Admitting: Family Medicine

## 2023-04-28 VITALS — BP 123/78 | HR 80 | Temp 98.7°F | Ht 66.0 in | Wt 217.0 lb

## 2023-04-28 DIAGNOSIS — I10 Essential (primary) hypertension: Secondary | ICD-10-CM

## 2023-04-28 DIAGNOSIS — E038 Other specified hypothyroidism: Secondary | ICD-10-CM | POA: Diagnosis not present

## 2023-04-28 DIAGNOSIS — E88819 Insulin resistance, unspecified: Secondary | ICD-10-CM

## 2023-04-28 DIAGNOSIS — E669 Obesity, unspecified: Secondary | ICD-10-CM

## 2023-04-28 DIAGNOSIS — Z6835 Body mass index (BMI) 35.0-35.9, adult: Secondary | ICD-10-CM

## 2023-04-28 DIAGNOSIS — E559 Vitamin D deficiency, unspecified: Secondary | ICD-10-CM

## 2023-04-28 MED ORDER — VITAMIN D (ERGOCALCIFEROL) 1.25 MG (50000 UNIT) PO CAPS
50000.0000 [IU] | ORAL_CAPSULE | ORAL | 0 refills | Status: DC
Start: 2023-04-28 — End: 2023-05-18

## 2023-04-28 NOTE — Progress Notes (Signed)
Chief Complaint:   OBESITY Betty Hayes is here to discuss her progress with her obesity treatment plan along with follow-up of her obesity related diagnoses. Betty Hayes is on the Category 2 Plan + 100 calories and states she is following her eating plan approximately 95% of the time. Betty Hayes states she is walking for 10-15 minutes 1-2 times per week.  Today's visit was #: 2 Starting weight: 220 lbs Starting date: 04/14/2023 Today's weight: 217 lbs Today's date: 04/28/2023 Total lbs lost to date: 3 Total lbs lost since last in-office visit: 3  Interim History: Patient is here for first follow up.  She felt bored of food choices and is so used to cooking with olive oil that she felt that was a big change.  Felt he was hungry constantly the first week. She was getting hungry around the meal times the second week.  She was weighing the protein and measuring the vegetables.  She did 8oz the first day and has done 6oz most of the other days. Not much planned for the next few weeks.  Subjective:   1. Essential hypertension Patient is on lisinopril 5 mg daily.  She denies chest pain, chest pressure, or headache.  2. Other specified hypothyroidism Patient is on levothyroxine and Cytomel.  Her recent TSH was 0.40 (slightly low).  She sees endocrinology and the goal is to be low normal as she functions better.  I discussed labs with the patient today.  3. Vitamin D deficiency Patient is on OTC vitamin D.  She denies nausea, vomiting, or muscle weakness but notes fatigue.  I discussed labs with the patient today.  4. Insulin resistance Patient has a new diagnosis.  Her recent A1c was 5.5 and insulin 18.0.  She is not on medications.  I discussed labs with the patient today.  Assessment/Plan:   1. Essential hypertension Patient will continue lisinopril with no change in medication or dose.  2. Other specified hypothyroidism Patient will continue her current medications with no change in dose.  3.  Vitamin D deficiency Patient agreed to increase vitamin D to 50,000 IU once weekly with no refills.  4. Insulin resistance Pathophysiology of insulin resistance, prediabetes, and diabetes mellitus were discussed with the patient today.  No change in meal plan.  Consider medications if hunger or cravings increase.  5. BMI 35.0-35.9,adult  6. Obesity with BMI of 35.5 Chesni is currently in the action stage of change. As such, her goal is to continue with weight loss efforts. She has agreed to the Category 2 Plan + 100 calories.   Exercise goals: As is.   Behavioral modification strategies: increasing lean protein intake, meal planning and cooking strategies, keeping healthy foods in the home, and planning for success.  Betty Hayes has agreed to follow-up with our clinic in 2 to 3 weeks. She was informed of the importance of frequent follow-up visits to maximize her success with intensive lifestyle modifications for her multiple health conditions.   Objective:   Blood pressure 123/78, pulse 80, temperature 98.7 F (37.1 C), height 5\' 6"  (1.676 m), weight 217 lb (98.4 kg), SpO2 97%. Body mass index is 35.02 kg/m.  General: Cooperative, alert, well developed, in no acute distress. HEENT: Conjunctivae and lids unremarkable. Cardiovascular: Regular rhythm.  Lungs: Normal work of breathing. Neurologic: No focal deficits.   Lab Results  Component Value Date   CREATININE 0.73 03/31/2023   BUN 14 03/31/2023   NA 140 03/31/2023   K 4.5 03/31/2023   CL  104 03/31/2023   CO2 20 03/31/2023   Lab Results  Component Value Date   ALT 16 03/31/2023   AST 21 03/31/2023   ALKPHOS 84 03/31/2023   BILITOT 0.6 03/31/2023   Lab Results  Component Value Date   HGBA1C 5.5 03/31/2023   HGBA1C 5.0 08/15/2016   Lab Results  Component Value Date   INSULIN 18.0 03/31/2023   Lab Results  Component Value Date   TSH 0.400 (L) 03/31/2023   Lab Results  Component Value Date   CHOL 177 03/31/2023    HDL 63 03/31/2023   LDLCALC 102 (H) 03/31/2023   TRIG 61 03/31/2023   CHOLHDL 2.7 04/07/2022   Lab Results  Component Value Date   VD25OH 30.3 03/31/2023   Lab Results  Component Value Date   WBC 7.9 03/31/2023   HGB 13.0 03/31/2023   HCT 40.2 03/31/2023   MCV 86 03/31/2023   PLT 216 03/31/2023   No results found for: "IRON", "TIBC", "FERRITIN"  Attestation Statements:   Reviewed by clinician on day of visit: allergies, medications, problem list, medical history, surgical history, family history, social history, and previous encounter notes.  Time spent on visit including pre-visit chart review and post-visit care and charting was 45 minutes.   I, Burt Knack, am acting as transcriptionist for Reuben Likes, MD.  I have reviewed the above documentation for accuracy and completeness, and I agree with the above. - Reuben Likes, MD

## 2023-04-29 ENCOUNTER — Encounter: Payer: Self-pay | Admitting: Family Medicine

## 2023-04-29 DIAGNOSIS — N1330 Unspecified hydronephrosis: Secondary | ICD-10-CM

## 2023-04-29 DIAGNOSIS — N200 Calculus of kidney: Secondary | ICD-10-CM

## 2023-05-04 ENCOUNTER — Telehealth (INDEPENDENT_AMBULATORY_CARE_PROVIDER_SITE_OTHER): Payer: BC Managed Care – PPO | Admitting: Psychology

## 2023-05-04 DIAGNOSIS — F909 Attention-deficit hyperactivity disorder, unspecified type: Secondary | ICD-10-CM

## 2023-05-04 DIAGNOSIS — F419 Anxiety disorder, unspecified: Secondary | ICD-10-CM

## 2023-05-04 DIAGNOSIS — F5089 Other specified eating disorder: Secondary | ICD-10-CM | POA: Diagnosis not present
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
## 2023-05-13 ENCOUNTER — Other Ambulatory Visit (INDEPENDENT_AMBULATORY_CARE_PROVIDER_SITE_OTHER): Payer: Self-pay | Admitting: Family Medicine

## 2023-05-13 ENCOUNTER — Other Ambulatory Visit (HOSPITAL_COMMUNITY): Payer: Self-pay | Admitting: Urology

## 2023-05-13 DIAGNOSIS — Q6239 Other obstructive defects of renal pelvis and ureter: Secondary | ICD-10-CM

## 2023-05-13 DIAGNOSIS — N130 Hydronephrosis with ureteropelvic junction obstruction: Secondary | ICD-10-CM

## 2023-05-13 DIAGNOSIS — E559 Vitamin D deficiency, unspecified: Secondary | ICD-10-CM

## 2023-05-18 ENCOUNTER — Ambulatory Visit (INDEPENDENT_AMBULATORY_CARE_PROVIDER_SITE_OTHER): Payer: BC Managed Care – PPO | Admitting: Family Medicine

## 2023-05-18 ENCOUNTER — Encounter (INDEPENDENT_AMBULATORY_CARE_PROVIDER_SITE_OTHER): Payer: Self-pay | Admitting: Family Medicine

## 2023-05-18 VITALS — BP 130/85 | HR 81 | Temp 99.4°F | Ht 66.0 in | Wt 214.0 lb

## 2023-05-18 DIAGNOSIS — E669 Obesity, unspecified: Secondary | ICD-10-CM | POA: Diagnosis not present

## 2023-05-18 DIAGNOSIS — E559 Vitamin D deficiency, unspecified: Secondary | ICD-10-CM | POA: Diagnosis not present

## 2023-05-18 DIAGNOSIS — Z6835 Body mass index (BMI) 35.0-35.9, adult: Secondary | ICD-10-CM

## 2023-05-18 DIAGNOSIS — E88819 Insulin resistance, unspecified: Secondary | ICD-10-CM

## 2023-05-18 MED ORDER — VITAMIN D3 50 MCG (2000 UT) PO TABS
4000.0000 [IU] | ORAL_TABLET | Freq: Every day | ORAL | Status: DC
Start: 2023-05-18 — End: 2023-06-29

## 2023-05-18 NOTE — Progress Notes (Signed)
Carlye Grippe, D.O.  ABFM, ABOM Specializing in Clinical Bariatric Medicine  Office located at: [address] [place], [place]  [address]     Assessment and Plan:   Medications Discontinued During This Encounter  Medication Reason   Vitamin D, Ergocalciferol, (DRISDOL) 1.25 MG (50000 UNIT) CAPS capsule Prescription never filled   Cholecalciferol (VITAMIN D3) 50 MCG (2000 UT) TABS Reorder   Insulin resistance Assessment: Condition is Not at goal.. This is diet/exercise controlled. She endorses having an increase in her hunger and cravings as she is not able to fully eat protein or solid meals due to oral surgery. Her A1c is elevated at 5.5 and her insulin level is also elevated at 18.0. We reviewed labs again from last OV and found a elevation in her LDL.  Lab Results  Component Value Date   HGBA1C 5.5 03/31/2023   HGBA1C 5.0 08/15/2016   INSULIN 18.0 03/31/2023    Plan:  - Continue to decrease simple carbs/ sugars; increase fiber and proteins -> follow her meal plan.    - Explained role of simple carbs and insulin levels on hunger and cravings.  - Anticipatory guidance given.    Demetra Shiner will continue to work on weight loss, exercise, via their meal plan we devised to help decrease the risk of progressing to diabetes.   - pt's questions and concerns regarding this condition addressed.    Vitamin D deficiency Assessment: Condition is Not optimized.Marland Kitchen Pt vitamin D level was below optimal range at 30.3. Pt endorses not taking OTC vitamin D supplements regularly and denies any adverse side effect on this. She has not started ERGO 50K lU that was prescribed during her last OV by Dr. Marquis Lunch. We reviewed labs again from last OV and found a her to be borderline vitamin B-12 deficient.  Lab Results  Component Value Date   VD25OH 30.3 03/31/2023   Plan: - Pt endorses wanting to continue OTC vitamin D supplement until next lab check. I instructed her to take her OTC supplement BID  instead of once daily.  - I discussed the importance of vitamin D to the patient's health and well-being as well as to their ability to lose weight.   - weight loss will likely improve availability of vitamin D, thus encouraged Marisah to continue with meal plan and their weight loss efforts to further improve this condition.  Thus, we will need to monitor levels regularly (every 3-4 mo on average) to keep levels within normal limits and prevent over supplementation.   TREATMENT PLAN FOR OBESITY: BMI 35.0-35.9,adult Obesity with BMI of 35.5 Assessment:  Kendle S Hashem is here to discuss her progress with her obesity treatment plan along with follow-up of her obesity related diagnoses. See Medical Weight Management Flowsheet for complete bioelectrical impedance results.  Condition is improving. Biometric data collected today, was reviewed with patient.   Since last office visit on 04/28/2023 patient's  Muscle mass has increased by 0.8lb. Fat mass has decreased by 4.4lb. Total body water has decreased by 3.2lb.  Counseling done on how various foods will affect these numbers and how to maximize success  Total lbs lost to date: 6 Total weight loss percentage to date: 2.73%   Plan: - Shayma is currently in the action stage of change. As such, her goal is to continue her weight management plan by following the Category 2 meal plan with 100 snack calories.   - I recommended she make a low fat tuna salad from the  can as it is softer then the pack.   - I recommended that she add cottage cheese to certain foods she might make for extra protein.   - I recommended skinny dipped candy.   Behavioral Intervention Additional resources provided today: Food journaling plan information and food log and metformin handout Evidence-based interventions for health behavior change were utilized today including the discussion of self monitoring techniques, problem-solving barriers and SMART goal setting  techniques.   Regarding patient's less desirable eating habits and patterns, we employed the technique of small changes.  Pt will specifically work on: journal her intake for next visit.     She has agreed to Think about enjoyable ways to increase daily physical activity and overcoming barriers to exercise and Increase physical activity in their day and reduce sedentary time (increase NEAT).   FOLLOW UP: Return in about 2 weeks (around 06/01/2023).  She was informed of the importance of frequent follow up visits to maximize her success with intensive lifestyle modifications for her multiple health conditions.  Subjective:   Chief complaint: Obesity Christella is here to discuss her progress with her obesity treatment plan. She is on the the Category 2 Plan with 100 snack calories and states she is following her eating plan approximately 50% of the time. She states she is not exercising.  Interval History:  DORINNE GRAEFF is here for a follow up office visit.     Since last office visit:  Pt has been having a hard time following the meal plan as she recently had dental surgery. She explained that she is drinking more fluids/protein shakes and eating mainly soups and smooth foods such as yogurt, cottage cheese, and mashed potatoes. She informed me that she does not like milk or fairlife milk. She does although like fish and tuna. She has increased her water intake significantly. She has met with Dr. Dewaine Conger and enjoyed her appt.   We reviewed her meal plan and all questions were answered.   Review of Systems:  Pertinent positives were addressed with patient today.  Reviewed by clinician on day of visit: allergies, medications, problem list, medical history, surgical history, family history, social history, and previous encounter notes.  Weight Summary and Biometrics   Weight Lost Since Last Visit: 3lb  No data recorded  Vitals Temp: 99.4 F (37.4 C) BP: 130/85 Pulse Rate: 81 SpO2: 95  %   Anthropometric Measurements Height: 5\' 6"  (1.676 m) Weight: 214 lb (97.1 kg) BMI (Calculated): 34.56 Weight at Last Visit: 217lb Weight Lost Since Last Visit: 3lb Starting Weight: 220 lb Total Weight Loss (lbs): 6 lb (2.722 kg) Peak Weight: 213lb   Body Composition  Body Fat %: 42 % Fat Mass (lbs): 90 lbs Muscle Mass (lbs): 118 lbs Total Body Water (lbs): 83.6 lbs Visceral Fat Rating : 9   Other Clinical Data Fasting: no Labs: no Today's Visit #: 3 Starting Date: 04/14/23     Objective:   PHYSICAL EXAM: Blood pressure 130/85, pulse 81, temperature 99.4 F (37.4 C), height 5\' 6"  (1.676 m), weight 214 lb (97.1 kg), SpO2 95%. Body mass index is 34.54 kg/m.  General: Well Developed, well nourished, and in no acute distress.  HEENT: Normocephalic, atraumatic Skin: Warm and dry, cap RF less 2 sec, good turgor Chest:  Normal excursion, shape, no gross abn Respiratory: speaking in full sentences, no conversational dyspnea NeuroM-Sk: Ambulates w/o assistance, moves * 4 Psych: A and O *3, insight good, mood-full  DIAGNOSTIC DATA REVIEWED:  BMET  Component Value Date/Time   NA 140 03/31/2023 0820   K 4.5 03/31/2023 0820   CL 104 03/31/2023 0820   CO2 20 03/31/2023 0820   GLUCOSE 85 03/31/2023 0820   GLUCOSE 106 (H) 08/11/2022 0814   BUN 14 03/31/2023 0820   CREATININE 0.73 03/31/2023 0820   CALCIUM 9.0 03/31/2023 0820   GFRNONAA >60 08/11/2022 0814   GFRAA >60 01/24/2020 0814   Lab Results  Component Value Date   HGBA1C 5.5 03/31/2023   HGBA1C 5.0 08/15/2016   Lab Results  Component Value Date   INSULIN 18.0 03/31/2023   Lab Results  Component Value Date   TSH 0.400 (L) 03/31/2023   CBC    Component Value Date/Time   WBC 7.9 03/31/2023 0820   WBC 7.0 08/11/2022 0814   RBC 4.68 03/31/2023 0820   RBC 4.63 08/11/2022 0814   HGB 13.0 03/31/2023 0820   HCT 40.2 03/31/2023 0820   PLT 216 03/31/2023 0820   MCV 86 03/31/2023 0820   MCH 27.8  03/31/2023 0820   MCH 27.6 08/11/2022 0814   MCHC 32.3 03/31/2023 0820   MCHC 32.9 08/11/2022 0814   RDW 13.2 03/31/2023 0820   Iron Studies No results found for: "IRON", "TIBC", "FERRITIN", "IRONPCTSAT" Lipid Panel     Component Value Date/Time   CHOL 177 03/31/2023 0820   TRIG 61 03/31/2023 0820   HDL 63 03/31/2023 0820   CHOLHDL 2.7 04/07/2022 1541   LDLCALC 102 (H) 03/31/2023 0820   Hepatic Function Panel     Component Value Date/Time   PROT 6.7 03/31/2023 0820   ALBUMIN 4.5 03/31/2023 0820   AST 21 03/31/2023 0820   ALT 16 03/31/2023 0820   ALKPHOS 84 03/31/2023 0820   BILITOT 0.6 03/31/2023 0820      Component Value Date/Time   TSH 0.400 (L) 03/31/2023 0820   Nutritional Lab Results  Component Value Date   VD25OH 30.3 03/31/2023    Attestations:  Patient was in the office today and time spent on visit including pre-visit chart review and post-visit care/coordination of care and electronic medical record documentation was 41 minutes. 50% of the time was in face to face counseling of this patient's medical condition(s) and providing education on treatment options to include the first-line treatment of diet and lifestyle modification.   I, Clinical biochemist, acting as a Stage manager for [employer], DO., have compiled all relevant documentation for today's office visit on behalf of Thomasene Lot, DO, while in the presence of [employer], DO.  I have reviewed the above documentation for accuracy and completeness, and I agree with the above. Carlye Grippe, D.O.  The 21st Century Cures Act was signed into law in 2016 which includes the topic of electronic health records.  This provides immediate access to information in MyChart.  This includes consultation notes, operative notes, office notes, lab results and pathology reports.  If you have any questions about what you read please let us know at your next visit so we can discuss your concerns and take  corrective action if need be.  We are right here with you.

## 2023-05-20 ENCOUNTER — Encounter (HOSPITAL_COMMUNITY)
Admission: RE | Admit: 2023-05-20 | Discharge: 2023-05-20 | Disposition: A | Discharge status: Home / Self Care | Payer: BC Managed Care – PPO | Source: Ambulatory Visit | Attending: Urology | Admitting: Urology

## 2023-05-20 DIAGNOSIS — N130 Hydronephrosis with ureteropelvic junction obstruction: Secondary | ICD-10-CM | POA: Insufficient documentation

## 2023-05-20 DIAGNOSIS — Q6239 Other obstructive defects of renal pelvis and ureter: Secondary | ICD-10-CM | POA: Insufficient documentation

## 2023-05-20 MED ORDER — FUROSEMIDE 10 MG/ML IJ SOLN: 49.0000 mg | Freq: Once | INTRAMUSCULAR | Status: DC

## 2023-05-20 MED ORDER — TECHNETIUM TC 99M MERTIATIDE
5.2000 | Freq: Once | INTRAVENOUS | Status: AC
  Administered 2023-05-20: 5.2 via INTRAVENOUS

## 2023-05-20 MED ORDER — FUROSEMIDE 10 MG/ML IJ SOLN
INTRAMUSCULAR | Status: AC
  Filled 2023-05-20: qty 8

## 2023-05-22 DIAGNOSIS — Z90710 Acquired absence of both cervix and uterus: Secondary | ICD-10-CM | POA: Diagnosis not present

## 2023-05-22 DIAGNOSIS — N1339 Other hydronephrosis: Secondary | ICD-10-CM | POA: Diagnosis not present

## 2023-05-25 ENCOUNTER — Telehealth (INDEPENDENT_AMBULATORY_CARE_PROVIDER_SITE_OTHER): Payer: BC Managed Care – PPO | Admitting: Psychology

## 2023-05-26 ENCOUNTER — Telehealth (INDEPENDENT_AMBULATORY_CARE_PROVIDER_SITE_OTHER): Payer: BC Managed Care – PPO | Admitting: Psychology

## 2023-05-31 ENCOUNTER — Other Ambulatory Visit: Payer: Self-pay | Admitting: Family Medicine

## 2023-05-31 DIAGNOSIS — R1310 Dysphagia, unspecified: Secondary | ICD-10-CM

## 2023-05-31 DIAGNOSIS — K219 Gastro-esophageal reflux disease without esophagitis: Secondary | ICD-10-CM

## 2023-06-01 ENCOUNTER — Ambulatory Visit (INDEPENDENT_AMBULATORY_CARE_PROVIDER_SITE_OTHER): Payer: BC Managed Care – PPO | Admitting: Family Medicine

## 2023-06-01 ENCOUNTER — Encounter (INDEPENDENT_AMBULATORY_CARE_PROVIDER_SITE_OTHER): Payer: Self-pay | Admitting: Family Medicine

## 2023-06-01 VITALS — BP 118/80 | HR 78 | Temp 98.2°F | Ht 66.0 in | Wt 214.0 lb

## 2023-06-01 DIAGNOSIS — N130 Hydronephrosis with ureteropelvic junction obstruction: Secondary | ICD-10-CM | POA: Diagnosis not present

## 2023-06-01 DIAGNOSIS — E88819 Insulin resistance, unspecified: Secondary | ICD-10-CM | POA: Diagnosis not present

## 2023-06-01 DIAGNOSIS — E559 Vitamin D deficiency, unspecified: Secondary | ICD-10-CM | POA: Insufficient documentation

## 2023-06-01 DIAGNOSIS — Z6835 Body mass index (BMI) 35.0-35.9, adult: Secondary | ICD-10-CM | POA: Insufficient documentation

## 2023-06-01 DIAGNOSIS — E669 Obesity, unspecified: Secondary | ICD-10-CM | POA: Diagnosis not present

## 2023-06-01 DIAGNOSIS — E66812 Obesity, class 2: Secondary | ICD-10-CM | POA: Insufficient documentation

## 2023-06-01 NOTE — Progress Notes (Signed)
Carlye Grippe, D.O.  ABFM, ABOM Specializing in Clinical Bariatric Medicine  Office located at: [address] [place], [place]  [address]     Assessment and Plan:   Vitamin D deficiency Assessment: Condition is Not optimized.. This is being treated with OTC oral vitamin D supplement. She denies any adverse side effects on this medication especially regarding her bone health or any pain.  Lab Results  Component Value Date   VD25OH 30.3 03/31/2023   Plan:- Continue with OTC cholecalciferol at current dose BID. Pt denies wanting to start Ergocalciferol.   - Informed patient this may be a lifelong thing, and she was encouraged to continue to take the medicine until told otherwise.     - We will continue to monitor levels regularly to keep levels within normal limits and prevent over supplementation.   Insulin resistance Assessment: Condition is Not at goal.. This is diet/exercise controlled. Pt is still limited on her protein intake as she is only allowed to eat soft foods or liquids. Pt endorses that her hunger and cravings are under control for now. However, she is ready to eat solid foods.  Lab Results  Component Value Date   HGBA1C 5.5 03/31/2023   HGBA1C 5.0 08/15/2016   INSULIN 18.0 03/31/2023    Plan: - She will continue to focus on protein-rich, low simple carbohydrate foods. We reviewed the importance of hydration, regular exercise for stress reduction, and restorative sleep.   - Anticipatory guidance given.    - We will recheck A1c and fasting insulin level in approximately 3 months from last check, or as deemed appropriate.    TREATMENT PLAN FOR OBESITY: BMI 35.0-35.9,adult Obesity with BMI of 35.5 Assessment:  Jillene S Bear is here to discuss her progress with her obesity treatment plan along with follow-up of her obesity related diagnoses. See Medical Weight Management Flowsheet for complete bioelectrical impedance results.  Condition is docourse:  improving. Biometric data collected today, was reviewed with patient.   Since last office visit on 05/18/2023 patient's  Muscle mass has increased by 0.4lb. Fat mass has decreased by 0.4lb. Total body water has decreased by 1.2lb.  Counseling done on how various foods will affect these numbers and how to maximize success  Total lbs lost to date: 6 Total weight loss percentage to date: 2.73%   Plan: - Livie will work on following the Category 2 meal plan with 100 snack calories as a guide and staying within 1100-1200 calories 85g+ of protein per day.  Behavioral Intervention Additional resources provided today: Recipes and food log Evidence-based interventions for health behavior change were utilized today including the discussion of self monitoring techniques, problem-solving barriers and SMART goal setting techniques.   Regarding patient's less desirable eating habits and patterns, we employed the technique of small changes.  Pt will specifically work on: journal for next visit.     She has agreed to Think about enjoyable ways to increase daily physical activity and overcoming barriers to exercise and Increase physical activity in their day and reduce sedentary time (increase NEAT).   FOLLOW UP: Return in about 2 weeks (around 06/15/2023).  She was informed of the importance of frequent follow up visits to maximize her success with intensive lifestyle modifications for her multiple health conditions.  Subjective:   Chief complaint: Obesity Railynn is here to discuss her progress with her obesity treatment plan. She is on the the Category 2 Plan with 100 snack calories and states she is following her eating  plan approximately 75% of the time. She states she is not exercising.  Interval History:  YESSICA CURRIE is here for a follow up office visit.     Since last office visit:  Since last OV she has been well. She informed me that she still has stiches in her mouth from previous dental  work. She is only allowed to eat soft foods or liquids. Pt informed me that she may have to have a repeat surgery. Pt currently does journal her food intake which usually includes cottages cheese, yogurt, protein shakes, and applesauce. She mainly journaled her food intake on paper but did not always journal her calories or protein. She endorses getting about 60-70g of protein.   We reviewed her meal plan and all questions were answered.   Review of Systems:  Pertinent positives were addressed with patient today.  Reviewed by clinician on day of visit: allergies, medications, problem list, medical history, surgical history, family history, social history, and previous encounter notes.  Weight Summary and Biometrics   Weight Lost Since Last Visit: 0lb  Weight Gained Since Last Visit: 0lb   Vitals Temp: 98.2 F (36.8 C) BP: 118/80 Pulse Rate: 78 SpO2: 98 %   Anthropometric Measurements Height: 5\' 6"  (1.676 m) Weight: 214 lb (97.1 kg) BMI (Calculated): 34.56 Weight at Last Visit: 214lb Weight Lost Since Last Visit: 0lb Weight Gained Since Last Visit: 0lb Starting Weight: 220lb Total Weight Loss (lbs): 6 lb (2.722 kg) Peak Weight: 213lb   Body Composition  Body Fat %: 41.8 % Fat Mass (lbs): 89.6 lbs Muscle Mass (lbs): 118.4 lbs Total Body Water (lbs): 82.4 lbs Visceral Fat Rating : 9   Other Clinical Data Fasting: No Labs: No Today's Visit #: 4 Starting Date: 04/14/23     Objective:   PHYSICAL EXAM: Blood pressure 118/80, pulse 78, temperature 98.2 F (36.8 C), height 5\' 6"  (1.676 m), weight 214 lb (97.1 kg), SpO2 98%. Body mass index is 34.54 kg/m.  General: Well Developed, well nourished, and in no acute distress.  HEENT: Normocephalic, atraumatic Skin: Warm and dry, cap RF less 2 sec, good turgor Chest:  Normal excursion, shape, no gross abn Respiratory: speaking in full sentences, no conversational dyspnea NeuroM-Sk: Ambulates w/o assistance, moves *  4 Psych: A and O *3, insight good, mood-full  DIAGNOSTIC DATA REVIEWED:  BMET    Component Value Date/Time   NA 140 03/31/2023 0820   K 4.5 03/31/2023 0820   CL 104 03/31/2023 0820   CO2 20 03/31/2023 0820   GLUCOSE 85 03/31/2023 0820   GLUCOSE 106 (H) 08/11/2022 0814   BUN 14 03/31/2023 0820   CREATININE 0.73 03/31/2023 0820   CALCIUM 9.0 03/31/2023 0820   GFRNONAA >60 08/11/2022 0814   GFRAA >60 01/24/2020 0814   Lab Results  Component Value Date   HGBA1C 5.5 03/31/2023   HGBA1C 5.0 08/15/2016   Lab Results  Component Value Date   INSULIN 18.0 03/31/2023   Lab Results  Component Value Date   TSH 0.400 (L) 03/31/2023   CBC    Component Value Date/Time   WBC 7.9 03/31/2023 0820   WBC 7.0 08/11/2022 0814   RBC 4.68 03/31/2023 0820   RBC 4.63 08/11/2022 0814   HGB 13.0 03/31/2023 0820   HCT 40.2 03/31/2023 0820   PLT 216 03/31/2023 0820   MCV 86 03/31/2023 0820   MCH 27.8 03/31/2023 0820   MCH 27.6 08/11/2022 0814   MCHC 32.3 03/31/2023 0820   MCHC 32.9  08/11/2022 0814   RDW 13.2 03/31/2023 0820   Iron Studies No results found for: "IRON", "TIBC", "FERRITIN", "IRONPCTSAT" Lipid Panel     Component Value Date/Time   CHOL 177 03/31/2023 0820   TRIG 61 03/31/2023 0820   HDL 63 03/31/2023 0820   CHOLHDL 2.7 04/07/2022 1541   LDLCALC 102 (H) 03/31/2023 0820   Hepatic Function Panel     Component Value Date/Time   PROT 6.7 03/31/2023 0820   ALBUMIN 4.5 03/31/2023 0820   AST 21 03/31/2023 0820   ALT 16 03/31/2023 0820   ALKPHOS 84 03/31/2023 0820   BILITOT 0.6 03/31/2023 0820      Component Value Date/Time   TSH 0.400 (L) 03/31/2023 0820   Nutritional Lab Results  Component Value Date   VD25OH 30.3 03/31/2023    Attestations:   I, Clinical biochemist, acting as a Stage manager for [employer], DO., have compiled all relevant documentation for today's office visit on behalf of Thomasene Lot, DO, while in the presence of [employer],  DO.  I have reviewed the above documentation for accuracy and completeness, and I agree with the above. Carlye Grippe, D.O.  The 21st Century Cures Act was signed into law in 2016 which includes the topic of electronic health records.  This provides immediate access to information in MyChart.  This includes consultation notes, operative notes, office notes, lab results and pathology reports.  If you have any questions about what you read please let us know at your next visit so we can discuss your concerns and take corrective action if need be.  We are right here with you.

## 2023-06-09 ENCOUNTER — Other Ambulatory Visit: Payer: Self-pay | Admitting: Urology

## 2023-06-09 ENCOUNTER — Other Ambulatory Visit: Payer: Self-pay

## 2023-06-09 ENCOUNTER — Encounter (HOSPITAL_COMMUNITY): Payer: Self-pay | Admitting: Urology

## 2023-06-09 NOTE — Progress Notes (Signed)
For Anesthesia: PCP - Nils Pyle, MD  Cardiologist -N/A Endocrinologist-  Candie Echevaria NP Roma Kayser, MD  Oncologist- Doreatha Massed, MD   Chest x-ray -  N/A EKG - 03/31/23 in [facility] Stress Test - N/A ECHO - N/A Cardiac Cath - N/A Pacemaker/ICD device last checked: N/A Pacemaker orders received: N/A Device Rep notified: N/A  Spinal Cord Stimulator:N/A  Sleep Study - N/A CPAP - N/A  Fasting Blood Sugar - N/A Checks Blood Sugar _N/A____ times a day Date and result of last Hgb A1c- N/A  Blood Thinner Instructions: N/A Aspirin Instructions:  N/A Last Dose: N/A  Activity level: Able to exercise without chest pain and/or shortness of breath  Anesthesia review: heart murmur, HTN  Patient denies shortness of breath, fever, cough and chest pain during pre op phone call.   Patient verbalized understanding of instructions reviewed via telephone.

## 2023-06-10 ENCOUNTER — Encounter (HOSPITAL_COMMUNITY): Payer: Self-pay | Admitting: Urology

## 2023-06-10 ENCOUNTER — Other Ambulatory Visit (HOSPITAL_COMMUNITY): Payer: Self-pay

## 2023-06-10 ENCOUNTER — Ambulatory Visit (HOSPITAL_COMMUNITY): Payer: Self-pay | Admitting: Registered Nurse

## 2023-06-10 ENCOUNTER — Ambulatory Visit (HOSPITAL_COMMUNITY)
Admission: RE | Admit: 2023-06-10 | Discharge: 2023-06-10 | Disposition: A | Discharge status: Home / Self Care | Payer: BC Managed Care – PPO | Attending: Urology | Admitting: Urology

## 2023-06-10 ENCOUNTER — Ambulatory Visit (HOSPITAL_COMMUNITY): Payer: BC Managed Care – PPO

## 2023-06-10 ENCOUNTER — Encounter (HOSPITAL_COMMUNITY)
Admission: RE | Disposition: A | Discharge status: Home / Self Care | Payer: Self-pay | Source: Home / Self Care | Attending: Urology

## 2023-06-10 ENCOUNTER — Ambulatory Visit (HOSPITAL_COMMUNITY): Payer: BC Managed Care – PPO | Admitting: Registered Nurse

## 2023-06-10 DIAGNOSIS — N1330 Unspecified hydronephrosis: Secondary | ICD-10-CM | POA: Diagnosis not present

## 2023-06-10 DIAGNOSIS — Z79899 Other long term (current) drug therapy: Secondary | ICD-10-CM | POA: Diagnosis not present

## 2023-06-10 DIAGNOSIS — J45909 Unspecified asthma, uncomplicated: Secondary | ICD-10-CM | POA: Insufficient documentation

## 2023-06-10 DIAGNOSIS — N130 Hydronephrosis with ureteropelvic junction obstruction: Secondary | ICD-10-CM | POA: Diagnosis not present

## 2023-06-10 DIAGNOSIS — I10 Essential (primary) hypertension: Secondary | ICD-10-CM | POA: Diagnosis not present

## 2023-06-10 DIAGNOSIS — Z8572 Personal history of non-Hodgkin lymphomas: Secondary | ICD-10-CM | POA: Diagnosis not present

## 2023-06-10 DIAGNOSIS — E039 Hypothyroidism, unspecified: Secondary | ICD-10-CM | POA: Diagnosis not present

## 2023-06-10 DIAGNOSIS — N131 Hydronephrosis with ureteral stricture, not elsewhere classified: Secondary | ICD-10-CM | POA: Insufficient documentation

## 2023-06-10 HISTORY — DX: Cardiac murmur, unspecified: R01.1

## 2023-06-10 HISTORY — DX: Other specified postprocedural states: R11.2

## 2023-06-10 HISTORY — DX: Vitamin D deficiency, unspecified: E55.9

## 2023-06-10 HISTORY — DX: Follicular lymphoma, unspecified, unspecified site: C82.90

## 2023-06-10 HISTORY — DX: Unspecified asthma, uncomplicated: J45.909

## 2023-06-10 HISTORY — DX: Autoimmune thyroiditis: E06.3

## 2023-06-10 HISTORY — DX: Other specified postprocedural states: Z98.890

## 2023-06-10 HISTORY — PX: CYSTOSCOPY WITH RETROGRADE PYELOGRAM, URETEROSCOPY AND STENT PLACEMENT: SHX5789

## 2023-06-10 HISTORY — DX: Insulin resistance, unspecified: E88.819

## 2023-06-10 LAB — POCT I-STAT, CHEM 8
BUN: 15 mg/dL (ref 6–20)
Calcium, Ion: 1.28 mmol/L (ref 1.15–1.40)
Chloride: 103 mmol/L (ref 98–111)
Creatinine, Ser: 0.8 mg/dL (ref 0.44–1.00)
Glucose, Bld: 84 mg/dL (ref 70–99)
HCT: 41 % (ref 36.0–46.0)
Hemoglobin: 13.9 g/dL (ref 12.0–15.0)
Potassium: 4 mmol/L (ref 3.5–5.1)
Sodium: 140 mmol/L (ref 135–145)
TCO2: 25 mmol/L (ref 22–32)

## 2023-06-10 SURGERY — CYSTOURETEROSCOPY, WITH RETROGRADE PYELOGRAM AND STENT INSERTION
Anesthesia: General | Site: Ureter | Laterality: Right

## 2023-06-10 MED ORDER — FENTANYL CITRATE (PF) 100 MCG/2ML IJ SOLN
INTRAMUSCULAR | Status: DC | PRN
  Administered 2023-06-10 (×2): 50 ug via INTRAVENOUS

## 2023-06-10 MED ORDER — ORAL CARE MOUTH RINSE: 15.0000 mL | Freq: Once | OROMUCOSAL | Status: AC

## 2023-06-10 MED ORDER — CEFAZOLIN SODIUM-DEXTROSE 2-4 GM/100ML-% IV SOLN
2.0000 g | INTRAVENOUS | Status: AC
  Administered 2023-06-10: 2 g via INTRAVENOUS
  Filled 2023-06-10: qty 100

## 2023-06-10 MED ORDER — LIDOCAINE 2% (20 MG/ML) 5 ML SYRINGE
INTRAMUSCULAR | Status: DC | PRN
  Administered 2023-06-10: 100 mg via INTRAVENOUS

## 2023-06-10 MED ORDER — MIDAZOLAM HCL 5 MG/5ML IJ SOLN
INTRAMUSCULAR | Status: DC | PRN
  Administered 2023-06-10: 2 mg via INTRAVENOUS

## 2023-06-10 MED ORDER — OXYCODONE-ACETAMINOPHEN 5-325 MG PO TABS
1.0000 | ORAL_TABLET | ORAL | 0 refills | Status: DC | PRN
  Filled 2023-06-10: qty 15, 3d supply, fill #0

## 2023-06-10 MED ORDER — ACETAMINOPHEN 500 MG PO TABS
1000.0000 mg | ORAL_TABLET | Freq: Once | ORAL | Status: AC
  Administered 2023-06-10: 1000 mg via ORAL
  Filled 2023-06-10: qty 2

## 2023-06-10 MED ORDER — SODIUM CHLORIDE 0.9 % IR SOLN
Status: DC | PRN
  Administered 2023-06-10: 3000 mL

## 2023-06-10 MED ORDER — PHENAZOPYRIDINE HCL 200 MG PO TABS
200.0000 mg | ORAL_TABLET | Freq: Three times a day (TID) | ORAL | 0 refills | Status: DC | PRN
  Filled 2023-06-10 (×2): qty 15, 5d supply, fill #0

## 2023-06-10 MED ORDER — CHLORHEXIDINE GLUCONATE 0.12 % MT SOLN
15.0000 mL | Freq: Once | OROMUCOSAL | Status: AC
  Administered 2023-06-10: 15 mL via OROMUCOSAL

## 2023-06-10 MED ORDER — SCOPOLAMINE 1 MG/3DAYS TD PT72
MEDICATED_PATCH | TRANSDERMAL | Status: DC | PRN
  Administered 2023-06-10: 1 via TRANSDERMAL

## 2023-06-10 MED ORDER — FENTANYL CITRATE PF 50 MCG/ML IJ SOSY
25.0000 ug | PREFILLED_SYRINGE | INTRAMUSCULAR | Status: DC | PRN
  Administered 2023-06-10: 25 ug via INTRAVENOUS

## 2023-06-10 MED ORDER — DEXAMETHASONE SODIUM PHOSPHATE 10 MG/ML IJ SOLN
INTRAMUSCULAR | Status: DC | PRN
  Administered 2023-06-10: 4 mg via INTRAVENOUS

## 2023-06-10 MED ORDER — ONDANSETRON HCL 4 MG/2ML IJ SOLN
INTRAMUSCULAR | Status: DC | PRN
  Administered 2023-06-10: 4 mg via INTRAVENOUS

## 2023-06-10 MED ORDER — HYDROMORPHONE HCL 1 MG/ML IJ SOLN
INTRAMUSCULAR | Status: DC | PRN
  Administered 2023-06-10: .3 mg via INTRAVENOUS

## 2023-06-10 MED ORDER — MIDAZOLAM HCL 2 MG/2ML IJ SOLN
INTRAMUSCULAR | Status: AC
Start: 2023-06-10 — End: ?
  Filled 2023-06-10: qty 2

## 2023-06-10 MED ORDER — KETOROLAC TROMETHAMINE 30 MG/ML IJ SOLN
INTRAMUSCULAR | Status: AC
  Filled 2023-06-10: qty 1

## 2023-06-10 MED ORDER — AMISULPRIDE (ANTIEMETIC) 5 MG/2ML IV SOLN
INTRAVENOUS | Status: AC
  Filled 2023-06-10: qty 4

## 2023-06-10 MED ORDER — IOHEXOL 300 MG/ML  SOLN
INTRAMUSCULAR | Status: DC | PRN
  Administered 2023-06-10: 20 mL

## 2023-06-10 MED ORDER — PHENYLEPHRINE 80 MCG/ML (10ML) SYRINGE FOR IV PUSH (FOR BLOOD PRESSURE SUPPORT)
PREFILLED_SYRINGE | INTRAVENOUS | Status: AC
  Filled 2023-06-10: qty 10

## 2023-06-10 MED ORDER — DIPHENHYDRAMINE HCL 50 MG/ML IJ SOLN
INTRAMUSCULAR | Status: AC
Start: 2023-06-10 — End: ?
  Filled 2023-06-10: qty 1

## 2023-06-10 MED ORDER — FENTANYL CITRATE (PF) 100 MCG/2ML IJ SOLN
INTRAMUSCULAR | Status: AC
  Filled 2023-06-10: qty 2

## 2023-06-10 MED ORDER — HYDROMORPHONE HCL 2 MG/ML IJ SOLN
INTRAMUSCULAR | Status: AC
  Filled 2023-06-10: qty 1

## 2023-06-10 MED ORDER — DIPHENHYDRAMINE HCL 50 MG/ML IJ SOLN
INTRAMUSCULAR | Status: DC | PRN
  Administered 2023-06-10: 12.5 mg via INTRAVENOUS

## 2023-06-10 MED ORDER — SCOPOLAMINE 1 MG/3DAYS TD PT72
MEDICATED_PATCH | TRANSDERMAL | Status: AC
  Filled 2023-06-10: qty 1

## 2023-06-10 MED ORDER — LACTATED RINGERS IV SOLN: INTRAVENOUS | Status: DC

## 2023-06-10 MED ORDER — OXYBUTYNIN CHLORIDE 5 MG PO TABS
5.0000 mg | ORAL_TABLET | Freq: Three times a day (TID) | ORAL | 1 refills | Status: DC | PRN
  Filled 2023-06-10: qty 30, 10d supply, fill #0

## 2023-06-10 MED ORDER — PROPOFOL 10 MG/ML IV BOLUS
INTRAVENOUS | Status: DC | PRN
  Administered 2023-06-10: 200 mg via INTRAVENOUS

## 2023-06-10 MED ORDER — AMISULPRIDE (ANTIEMETIC) 5 MG/2ML IV SOLN
10.0000 mg | Freq: Once | INTRAVENOUS | Status: AC
  Administered 2023-06-10: 10 mg via INTRAVENOUS

## 2023-06-10 MED ORDER — FENTANYL CITRATE PF 50 MCG/ML IJ SOSY
PREFILLED_SYRINGE | INTRAMUSCULAR | Status: AC
  Filled 2023-06-10: qty 1

## 2023-06-10 MED ORDER — KETOROLAC TROMETHAMINE 15 MG/ML IJ SOLN
INTRAMUSCULAR | Status: DC | PRN
Start: 2023-06-10 — End: 2023-06-10
  Administered 2023-06-10: 30 mg via INTRAVENOUS

## 2023-06-10 SURGICAL SUPPLY — 19 items
BAG URO CATCHER STRL LF (MISCELLANEOUS) ×1 IMPLANT
BASKET ZERO TIP NITINOL 2.4FR (BASKET) IMPLANT
CATH URETL OPEN 5X70 (CATHETERS) ×1 IMPLANT
CLOTH BEACON ORANGE TIMEOUT ST (SAFETY) ×1 IMPLANT
EXTRACTOR STONE NITINOL NGAGE (UROLOGICAL SUPPLIES) IMPLANT
FIBER LASER MOSES 200 DFL (Laser) IMPLANT
FIBER LASER MOSES 365 DFL (Laser) IMPLANT
GLOVE SURG LX STRL 7.5 STRW (GLOVE) ×1 IMPLANT
GOWN SRG XL LVL 4 BRTHBL STRL (GOWNS) ×1 IMPLANT
GUIDEWIRE STR DUAL SENSOR (WIRE) IMPLANT
GUIDEWIRE ZIPWRE .038 STRAIGHT (WIRE) ×1 IMPLANT
KIT TURNOVER KIT A (KITS) IMPLANT
MANIFOLD NEPTUNE II (INSTRUMENTS) ×1 IMPLANT
PACK CYSTO (CUSTOM PROCEDURE TRAY) ×1 IMPLANT
SHEATH NAVIGATOR HD 11/13X36 (SHEATH) IMPLANT
STENT URET 6FRX24 CONTOUR (STENTS) IMPLANT
STENT URET 6FRX26 CONTOUR (STENTS) IMPLANT
TUBING CONNECTING 10 (TUBING) ×1 IMPLANT
TUBING UROLOGY SET (TUBING) ×1 IMPLANT

## 2023-06-10 NOTE — Transfer of Care (Signed)
Immediate Anesthesia Transfer of Care Note  Patient: Betty Hayes  Procedure(s) Performed: CYSTOSCOPY WITH RIGHT RETROGRADE PYELOGRAM, URETEROSCOPY AND RIGHT URETERAL STENT PLACEMENT (Right: Ureter)  Patient Location: PACU  Anesthesia Type:General  Level of Consciousness: drowsy  Airway & Oxygen Therapy: Patient Spontanous Breathing and Patient connected to face mask oxygen  Post-op Assessment: Report given to RN and Post -op Vital signs reviewed and stable  Post vital signs: Reviewed and stable  Last Vitals:  Vitals Value Taken Time  BP 129/74 06/10/23 1308  Temp    Pulse 74 06/10/23 1309  Resp 11 06/10/23 1309  SpO2 100 % 06/10/23 1309  Vitals shown include unfiled device data.  Last Pain:  Vitals:   06/10/23 1058  TempSrc:   PainSc: 0-No pain         Complications: No notable events documented.

## 2023-06-10 NOTE — H&P (Signed)
Office Visit Report     06/01/2023   --------------------------------------------------------------------------------   Betty Hayes  MRN: [id number]  DOB: 1986/05/28, 37 year old Female  SSN:    PRIMARY CARE:     REFERRING:  Latrelle Dodrill. Octavia Heir,   PROVIDER:  Modena Slater, III, M.D.  TREATING:  Rhoderick Moody, M.D.  LOCATION:  [facility] [phone]     --------------------------------------------------------------------------------   CC/HPI: CC: Right hydronephrosis  HPI:  05/13/2023  37 year old female with a history of small bowel follicular lymphoma underwent CT scan of the abdomen and pelvis without contrast that revealed mild right hydronephrosis and a prominent right renal collecting system with normal caliber ureter raising the possibility of right UPJ obstruction. Last creatinine 0.73 on 03/31/2023.   06/01/2023: The patient is here today for routine follow-up. Above history noted. CT abdomen/pelvis with contrast from 05-23-23 showed stable, chronic right sided pelviectasis within normal caliber ureter suggestive of a chronic right UPJ obstruction. No other abnormalities were seen. Lasix renogram from 05/29/2023 showed a differential function of 53% on the left and 47% on the right. T1 half post Lasix was 7.9 minutes on the left and 9.7 minutes on the right. She reports dull right flank pain on/off since April with no other associated sxs. She notes her pain is worse when she drinks alcohol. Hx of stone episode in 2006, that required right ureteroscopy in [place]. No other GU surgeries or issues with recurrent UTIs.       ALLERGIES: None   MEDICATIONS: Lisinopril 5 mg tablet  Hydroxyzine Hcl  Liothyronine Sodium 5 mcg tablet  Magnesium  Pantoprazole Sodium  Synthroid  Vitamin B12  Vitamin D     GU PSH: No GU PSH    NON-GU PSH: No Non-GU PSH    GU PMH: Hydronephrosis - 05/22/2023, - 05/13/2023 UPJ obstruction (congenital) - 05/22/2023, -  05/13/2023    NON-GU PMH: No Non-GU PMH    FAMILY HISTORY: No Family History    SOCIAL HISTORY: Marital Status: Married    REVIEW OF SYSTEMS:    GU Review Female:   Patient reports frequent urination, get up at night to urinate, and trouble starting your stream. Patient denies hard to postpone urination, burning /pain with urination, leakage of urine, stream starts and stops, have to strain to urinate, and being pregnant.  Gastrointestinal (Upper):   Patient denies indigestion/ heartburn, vomiting, and nausea.  Gastrointestinal (Lower):   Patient denies diarrhea and constipation.  Constitutional:   Patient denies fever, night sweats, weight loss, and fatigue.  Skin:   Patient denies skin rash/ lesion and itching.  Eyes:   Patient denies blurred vision and double vision.  Ears/ Nose/ Throat:   Patient denies sore throat and sinus problems.  Hematologic/Lymphatic:   Patient denies swollen glands and easy bruising.  Cardiovascular:   Patient denies leg swelling and chest pains.  Respiratory:   Patient denies cough and shortness of breath.  Endocrine:   Patient denies excessive thirst.  Musculoskeletal:   Patient denies back pain and joint pain.  Neurological:   Patient denies headaches and dizziness.  Psychologic:   Patient denies depression and anxiety.   VITAL SIGNS: None   MULTI-SYSTEM PHYSICAL EXAMINATION:    Constitutional: Well-nourished. No physical deformities. Normally developed. Good grooming.  Neurologic / Psychiatric: Oriented to time, oriented to place, oriented to person. No depression, no anxiety, no agitation.     Complexity of Data:  Records Review:   Previous Doctor Records, Previous Hospital Records, Previous  Patient Records  X-Ray Review: C.T. Abdomen/Pelvis: Reviewed Films. Reviewed Report. Discussed With Patient. CLINICAL DATA: Right hydronephrosis, UPJ obstruction.   EXAM:  CT ABDOMEN AND PELVIS WITH CONTRAST   TECHNIQUE:  Multidetector CT imaging of the  abdomen and pelvis was performed  using the standard protocol following bolus administration of  intravenous contrast.   RADIATION DOSE REDUCTION: This exam was performed according to the  departmental dose-optimization program which includes automated  exposure control, adjustment of the mA and/or kV according to  patient size and/or use of iterative reconstruction technique.   CONTRAST: 100 cc of Omnipaque 300   COMPARISON: CT April 16, 2023   FINDINGS:  Lower chest: No acute abnormality..   Hepatobiliary: No suspicious hepatic lesion. Gallbladder is  unremarkable. No biliary ductal dilation.   Pancreas: No pancreatic ductal dilation or evidence of acute  inflammation.   Spleen: No splenomegaly or focal splenic lesion.   Adrenals/Urinary Tract: Bilateral adrenal glands appear normal.   Similar right pelviectasis with normal caliber ureter.   Bilateral fluid density renal lesions compatible with cysts and  additional renal lesions technically too small to accurately  characterize but statistically likely to reflect cysts which in the  absence of clinically indicated signs/symptoms are considered benign  and require no independent follow-up.   Urinary bladder is unremarkable for degree of distension.   Stomach/Bowel: No radiopaque enteric contrast material was  administered. Stomach is unremarkable for degree of distension. No  pathologic dilation of small or large bowel. Normal appendix.   Vascular/Lymphatic: Normal caliber abdominal aorta. Smooth IVC  contours. The portal, splenic and superior mesenteric veins are  patent. No pathologically enlarged abdominal or pelvic lymph nodes.   Reproductive: Status post hysterectomy. No adnexal masses.   Other: No significant abdominopelvic free fluid.   Musculoskeletal: No acute osseous abnormality.   IMPRESSION:  1. Similar right pelviectasis with normal caliber ureter, which may  reflect a chronic UPJ obstruction.   2. No acute abnormality in the abdomen or pelvis.    Electronically Signed  By: Maudry Mayhew M.D.  On: 05/23/2023 11:44 Lasix Renogram: Reviewed Films. Reviewed Report. Discussed With Patient. CLINICAL DATA: Right hydronephrosis, right UPJ obstruction.   EXAM:  NUCLEAR MEDICINE RENAL SCAN WITH DIURETIC ADMINISTRATION   TECHNIQUE:  Radionuclide angiographic and sequential renal images were obtained  after intravenous injection of radiopharmaceutical. Imaging was  continued during slow intravenous injection of Lasix approximately  15 minutes after the start of the examination.   RADIOPHARMACEUTICALS: 5.2 mCi Technetium-44m MAG3 IV   COMPARISON: None available. Findings are correlated with prior CT  examination 04/16/2023   FINDINGS:  Flow: Prompt symmetric arterial flow to the kidneys. Normal  symmetric cortical concentration of radiotracer.   Left renogram: Normal distribution. No defects. T-max concentration  at 3.7 minutes   Right renogram: Normal distribution. No defects. T-max concentration  at 4.2 minutes   Differential:   Left kidney = 53 %   Right kidney = 47 %   T1/2 post Lasix :   Left kidney = 7.9 minutes min   Right kidney = 9.7 minutes min   IMPRESSION:  Normal, near symmetric renal uptake, concentration, and clearance of  radiotracer. Visualized right UPJ obstruction does not result in  clinically significant functional obstruction.    Electronically Signed  By: Helyn Numbers M.D.  On: 05/29/2023 03:47      PROCEDURES:          Urinalysis w/Scope Dipstick Dipstick Cont'd Micro  Color: Yellow Bilirubin: Neg mg/dL  WBC/hpf: 0 - 5/hpf  Appearance: Slightly Cloudy Ketones: Neg mg/dL RBC/hpf: 0 - 2/hpf  Specific Gravity: 1.020 Blood: 1+ ery/uL Bacteria: Rare (0-9/hpf)  pH: <=5.0 Protein: Neg mg/dL Cystals: NS (Not Seen)  Glucose: Neg mg/dL Urobilinogen: 0.2 mg/dL Casts: NS (Not Seen)    Nitrites: Neg Trichomonas: Not Present    Leukocyte  Esterase: Neg leu/uL Mucous: Present      Epithelial Cells: 10 - 20/hpf      Yeast: NS (Not Seen)      Sperm: Not Present    ASSESSMENT:      ICD-10 Details  1 GU:   Hydronephrosis - N13.0 Chronic, Stable   PLAN:            Medications New Meds: Macrobid 100 mg capsule 1 capsule PO As Directed Take one capsule immediately following intercourse  #30  5 Refill(s)  Pharmacy Name:  [facility] [phone]  Address:  [address] [place], [place] [address]  Phone:  [phone]  Fax:  [phone]            Orders Labs Urine Culture          Schedule Return Visit/Planned Activity: Next Available Appointment - Schedule Surgery          Document Letter(s):  Created for Patient: Clinical Summary         Notes:   Plan for diagnostic ureteroscopy to assess the nature of her UPJ obstruction. The risks, benefits and alternatives of cystoscopy with RIGHT ureteroscopy and possible ureteral stent placement was discussed the patient. Risks included, but are not limited to: bleeding, urinary tract infection, ureteral injury/avulsion, ureteral stricture formation and the inherent risks of general anesthesia. The patient voices understanding and wishes to proceed.   -The risks, benefits and alternatives of RIGHT robot-assisted pyeloplasty was discussed with the patient. Risks include, but are not limited to, bleeding, infection, recurrence of the UPJ obstruction, ureteral stricture disease, urine leak, chronic pain, nephrectomy, multiple surgeries to correct her UPJ obstruction and the need for an indwelling stent and in the inherent risks of general anesthesia. The patient voices understanding.   -Start post-coital macrobid for UTI prophylaxis.

## 2023-06-10 NOTE — Op Note (Signed)
Operative Note  Preoperative diagnosis:  1.  Right UPJ obstruction  Postoperative diagnosis: 1.  Right UPJ obstruction  Procedure(s): 1.  Cystoscopy with diagnostic right ureteroscopy and right ureteral stent placement 2.  Right retrograde pyelogram with intraoperative interpretation fluoroscopic imaging  Surgeon: Rhoderick Moody, MD  Assistants:  None  Anesthesia:  General  Complications:  None  EBL: Less than 5 mL  Specimens: 1.  None  Drains/Catheters: 1.  Right 6 French, 24 cm JJ stent without tether  Intraoperative findings:   No intravesical abnormalities Right retrograde pyelogram revealed no filling defects within the distal aspects of the right ureter.  There was luminal narrowing at the right UPJ with uniform dilation of the right renal pelvis and renal calyces. Ureteroscopy revealed tortuosity and redundancy of the right UPJ with no evidence of luminal stricture/narrowing.  Indication:  Betty Hayes is a 37 y.o. female with right UPJ obstruction.  She has been consented for the above procedures, voices understanding wishes to proceed.  Description of procedure:  After informed consent was obtained, the patient was brought to the operating room and general LMA anesthesia was administered. The patient was then placed in the dorsolithotomy position and prepped and draped in the usual sterile fashion. A timeout was performed. A 23 French rigid cystoscope was then inserted into the urethral meatus and advanced into the bladder under direct vision. A complete bladder survey revealed no intravesical pathology.  A 5 French ureteral catheter was then inserted into the right ureteral orifice and a retrograde pyelogram was obtained, with the findings listed above.  A Glidewire was then used to intubate the lumen of the ureteral catheter and was advanced up to the right renal pelvis, under fluoroscopic guidance.  The catheter was then removed, leaving the wire in  place.  A semirigid ureteroscope was then advanced into the right ureter and advanced up to the right UPJ.  There was redundancy and tortuosity of the right UPJ, but no specific area of ureteral stricture or stenosis.  No abnormalities were seen within the right renal pelvis.  The semirigid ureteroscope was then removed under direct vision, identifying no evidence of luminal trauma or other mucosal abnormalities.  A 6 French, 24 cm JJ stent was then advanced over the wire and into good position within the right collecting system, confirming placement via fluoroscopy.  The patient's bladder was drained.  She tolerated the procedure well and was transferred to the postanesthesia in stable condition.  Plan: Follow-up on 06/15/2023 for office cystoscopy and stent removal

## 2023-06-10 NOTE — Anesthesia Procedure Notes (Signed)
Procedure Name: LMA Insertion Date/Time: 06/10/2023 12:25 PM  Performed by: Floydene Flock, CRNAPre-anesthesia Checklist: Patient identified, Emergency Drugs available, Suction available, Patient being monitored and Timeout performed Patient Re-evaluated:Patient Re-evaluated prior to induction Oxygen Delivery Method: Circle system utilized Preoxygenation: Pre-oxygenation with 100% oxygen Induction Type: IV induction LMA: LMA inserted LMA Size: 4.0 Placement Confirmation: CO2 detector and breath sounds checked- equal and bilateral Tube secured with: Tape

## 2023-06-10 NOTE — Anesthesia Preprocedure Evaluation (Addendum)
Anesthesia Evaluation  Patient identified by MRN, date of birth, ID band Patient awake    Reviewed: Allergy & Precautions, NPO status , Patient's Chart, lab work & pertinent test results  History of Anesthesia Complications (+) PONV and history of anesthetic complications  Airway Mallampati: II  TM Distance: >3 FB Neck ROM: Full    Dental no notable dental hx. (+) Teeth Intact, Dental Advisory Given   Pulmonary asthma    Pulmonary exam normal breath sounds clear to auscultation       Cardiovascular hypertension, Pt. on home beta blockers and Pt. on medications Normal cardiovascular exam Rhythm:Regular Rate:Normal     Neuro/Psych  PSYCHIATRIC DISORDERS Anxiety     negative neurological ROS     GI/Hepatic Neg liver ROS,GERD  ,,  Endo/Other  Hypothyroidism    Renal/GU negative Renal ROS  negative genitourinary   Musculoskeletal negative musculoskeletal ROS (+)    Abdominal   Peds  Hematology negative hematology ROS (+) Follicular lymphoma   Anesthesia Other Findings   Reproductive/Obstetrics                             Anesthesia Physical Anesthesia Plan  ASA: 2  Anesthesia Plan: General   Post-op Pain Management: Tylenol PO (pre-op)*   Induction: Intravenous  PONV Risk Score and Plan: 4 or greater and Midazolam, Dexamethasone, Ondansetron, Treatment may vary due to age or medical condition and Scopolamine patch - Pre-op  Airway Management Planned: LMA  Additional Equipment:   Intra-op Plan:   Post-operative Plan: Extubation in OR  Informed Consent: I have reviewed the patients History and Physical, chart, labs and discussed the procedure including the risks, benefits and alternatives for the proposed anesthesia with the patient or authorized representative who has indicated his/her understanding and acceptance.     Dental advisory given  Plan Discussed with:  CRNA  Anesthesia Plan Comments:        Anesthesia Quick Evaluation

## 2023-06-11 ENCOUNTER — Other Ambulatory Visit (HOSPITAL_COMMUNITY): Payer: Self-pay

## 2023-06-11 ENCOUNTER — Encounter (HOSPITAL_COMMUNITY): Payer: Self-pay | Admitting: Urology

## 2023-06-11 ENCOUNTER — Other Ambulatory Visit: Payer: Self-pay

## 2023-06-11 NOTE — Anesthesia Postprocedure Evaluation (Signed)
Anesthesia Post Note  Patient: Betty Hayes  Procedure(s) Performed: CYSTOSCOPY WITH RIGHT RETROGRADE PYELOGRAM, URETEROSCOPY AND RIGHT URETERAL STENT PLACEMENT (Right: Ureter)     Patient location during evaluation: PACU Anesthesia Type: General Level of consciousness: awake and alert Pain management: pain level controlled Vital Signs Assessment: post-procedure vital signs reviewed and stable Respiratory status: spontaneous breathing, nonlabored ventilation, respiratory function stable and patient connected to nasal cannula oxygen Cardiovascular status: blood pressure returned to baseline and stable Postop Assessment: no apparent nausea or vomiting Anesthetic complications: no  No notable events documented.  Last Vitals:  Vitals:   06/10/23 1530 06/10/23 1545  BP: 117/82 121/81  Pulse: 63 69  Resp:    Temp:    SpO2: 97% 97%    Last Pain:  Vitals:   06/10/23 1545  TempSrc:   PainSc: 0-No pain                 Pegi Milazzo L Erma Raiche

## 2023-06-15 ENCOUNTER — Ambulatory Visit (INDEPENDENT_AMBULATORY_CARE_PROVIDER_SITE_OTHER): Payer: BC Managed Care – PPO | Admitting: Family Medicine

## 2023-06-15 ENCOUNTER — Encounter (INDEPENDENT_AMBULATORY_CARE_PROVIDER_SITE_OTHER): Payer: Self-pay | Admitting: Family Medicine

## 2023-06-15 ENCOUNTER — Encounter: Payer: Self-pay | Admitting: Urology

## 2023-06-15 VITALS — BP 127/84 | HR 114 | Temp 98.8°F | Ht 66.0 in | Wt 214.0 lb

## 2023-06-15 DIAGNOSIS — I10 Essential (primary) hypertension: Secondary | ICD-10-CM | POA: Diagnosis not present

## 2023-06-15 DIAGNOSIS — E669 Obesity, unspecified: Secondary | ICD-10-CM | POA: Diagnosis not present

## 2023-06-15 DIAGNOSIS — E88819 Insulin resistance, unspecified: Secondary | ICD-10-CM | POA: Diagnosis not present

## 2023-06-15 DIAGNOSIS — E559 Vitamin D deficiency, unspecified: Secondary | ICD-10-CM | POA: Diagnosis not present

## 2023-06-15 DIAGNOSIS — Q6211 Congenital occlusion of ureteropelvic junction: Secondary | ICD-10-CM | POA: Diagnosis not present

## 2023-06-15 DIAGNOSIS — Z6835 Body mass index (BMI) 35.0-35.9, adult: Secondary | ICD-10-CM

## 2023-06-15 MED ORDER — METFORMIN HCL 500 MG PO TABS: ORAL_TABLET | ORAL | 1 refills | Status: DC

## 2023-06-15 MED ORDER — METFORMIN HCL 500 MG PO TABS: ORAL_TABLET | ORAL | 0 refills | Status: DC

## 2023-06-15 MED ORDER — KETOROLAC TROMETHAMINE 10 MG PO TABS: 10.0000 mg | ORAL_TABLET | Freq: Four times a day (QID) | ORAL | Status: AC

## 2023-06-15 NOTE — Progress Notes (Signed)
Betty Hayes, D.O.  ABFM, ABOM Specializing in Clinical Bariatric Medicine  Office located at: [address] [place], [place]  [address]   Assessment and Plan:  Recheck labs in 1 month.  FOR THE DISEASE OF OBESITY: BMI 35.0-35.9,adult Obesity with BMI of 35.5 Since last office visit on 06/01/2023 patient's  Muscle mass has decreased by 0.4lb. Fat mass has increased by 0.2lb. Total body water has decreased by 0.6lb.  Counseling done on how various foods will affect these numbers and how to maximize success  Total lbs lost to date: 6 Total weight loss percentage to date: 2.73%    Recommended Dietary Goals Bemnet is currently in the action stage of change. As such, her goal is to continue weight management plan.  She has agreed to: continue current plan   Behavioral Intervention We discussed the following today: increasing lean protein intake to established goals, decreasing eating out or consumption of processed foods, and making healthy choices when eating convenient foods, avoiding temptations and identifying enticing environmental cues, and continue to practice mindfulness when eating  Additional resources provided today: Handout on complex carbohydrates and lean sources of protein and handout on CAT 2 meal plan  Evidence-based interventions for health behavior change were utilized today including the discussion of self monitoring techniques, problem-solving barriers and SMART goal setting techniques.   Regarding patient's less desirable eating habits and patterns, we employed the technique of small changes.   Pt will specifically work on: Increase protein and remove unhealthy foods out of the house for next visit.    Recommended Physical Activity Goals Kyan has been advised to work up to 150 minutes of moderate intensity aerobic activity a week and strengthening exercises 2-3 times per week for cardiovascular health, weight loss maintenance and preservation of  muscle mass.   She has agreed to :  Think about enjoyable ways to increase daily physical activity and overcoming barriers to exercise and Increase physical activity in their day and reduce sedentary time (increase NEAT).   Pharmacotherapy We discussed various medication options to help Maxine with her weight loss efforts and we both agreed to : continue with nutritional and behavioral strategies   FOR ASSOCIATED CONDITIONS ADDRESSED TODAY: Insulin resistance Assessment: Condition is Not optimized.. This is diet/exercise controlled.Pt endorses eating off plan. She states that she does not crave off plan food but when she knows that if it is there then she will want it. Pt notes not getting enough protein.  Lab Results  Component Value Date   HGBA1C 5.5 03/31/2023   HGBA1C 5.0 08/15/2016   INSULIN 18.0 03/31/2023    Plan: - Begin Metformin 500mg  (start taking at half a tablet at lunch daily for 4-6 days and if tolerated increase to 1 full tablet.) Continue her prudent nutritional plan that is low in simple carbohydrates, saturated fats and trans fats to goal of 5-10% weight loss to achieve significant health benefits.  Pt encouraged to continually advance exercise and cardiovascular fitness as tolerated throughout weight loss journey. Continue to decrease simple carbs/ sugars; increase fiber and proteins -> follow her meal plan. We discussed the risks and benefits of various medication options such as Metformin to help control her carb cravings and help [place] in the management of this disease process as well as with weight loss.  Will consider starting one of these meds in future as we will focus on prudent nutritional plan at this time. Metformin information handout was given. We will recheck A1c and  fasting insulin level in approximately 3 months from last check, or as deemed appropriate.  Insulin resistance -     metFORMIN HCl; 1 po with lunch daily  Dispense: 30 tablet; Refill: 1   Vitamin D  deficiency Assessment:  Condition is not at goal. Pt is currently taking OTC oral vitamin D supplements once daily. She tolerates this without difficulty and denies any negative side effects.    Plan: - Continue OTC oral vitamin D 4,000lU BID.Weight loss will likely improve availability of vitamin D, thus encouraged Ninnie to continue with meal plan and their weight loss efforts to further improve this condition.  We will need to monitor levels regularly (every 3-4 mo on average).    Follow up:   Return in about 5 weeks (around 07/20/2023). She was informed of the importance of frequent follow up visits to maximize her success with intensive lifestyle modifications for her multiple health conditions.  Subjective:   Chief complaint: Obesity Azalie is here to discuss her progress with her obesity treatment plan. She is on the the Category 2 Plan and keeping a food journal and adhering to recommended goals of 1100-1200 calories and 85+ protein and 100 snack calories and states she is following her eating plan approximately 50% of the time. She states she is not exercising.  Interval History:  Betty Hayes is here for a follow up office visit. Since last OV,  she has been well. Pt informed me that she has already started her Holiday eating and is not sure what to do when Thanksgiving comes. She notes that she might be meeting her calorie goals and not her protein goals. She has been journaling her food intake. She informed me that she had a stent recently taken out and is still in pain. She notes "when she does not feel good she does not eat good". She will eat broccoli, carrots, peanuts, etc for lunch with no protein. Pt admits meeting her protein goals is a lot of meat.    Barriers identified: having difficulty focusing on healthy eating.   Pharmacotherapy for weight loss: She is currently taking no anti-obesity medication.   Review of Systems:  Pertinent positives were addressed with  patient today.  Reviewed by clinician on day of visit: allergies, medications, problem list, medical history, surgical history, family history, social history, and previous encounter notes.  Weight Summary and Biometrics   Weight Lost Since Last Visit: 0lb  Weight Gained Since Last Visit: 0lb    Vitals Temp: 98.8 F (37.1 C) BP: 127/84 Pulse Rate: (!) 114 SpO2: 96 %   Anthropometric Measurements Height: 5\' 6"  (1.676 m) Weight: 214 lb (97.1 kg) BMI (Calculated): 34.56 Weight at Last Visit: 214lb Weight Lost Since Last Visit: 0lb Weight Gained Since Last Visit: 0lb Starting Weight: 220lb Total Weight Loss (lbs): 6 lb (2.722 kg) Peak Weight: 213lb   Body Composition  Body Fat %: 42 % Fat Mass (lbs): 89.8 lbs Muscle Mass (lbs): 118 lbs Total Body Water (lbs): 81.8 lbs Visceral Fat Rating : 9   Other Clinical Data Fasting: no Labs: no Today's Visit #: 5 Starting Date: 04/14/23    Objective:   PHYSICAL EXAM: Blood pressure 127/84, pulse (!) 114, temperature 98.8 F (37.1 C), height 5\' 6"  (1.676 m), weight 214 lb (97.1 kg), SpO2 96%. Body mass index is 34.54 kg/m.  General: she is overweight, cooperative and in no acute distress. PSYCH: Has normal mood, affect and thought process.   HEENT: EOMI, sclerae are  anicteric. Lungs: Normal breathing effort, no conversational dyspnea. Extremities: Moves * 4 Neurologic: A and O * 3, good insight  DIAGNOSTIC DATA REVIEWED: BMET    Component Value Date/Time   NA 140 06/10/2023 1137   NA 140 03/31/2023 0820   K 4.0 06/10/2023 1137   CL 103 06/10/2023 1137   CO2 20 03/31/2023 0820   GLUCOSE 84 06/10/2023 1137   BUN 15 06/10/2023 1137   BUN 14 03/31/2023 0820   CREATININE 0.80 06/10/2023 1137   CALCIUM 9.0 03/31/2023 0820   GFRNONAA >60 08/11/2022 0814   GFRAA >60 01/24/2020 0814   Lab Results  Component Value Date   HGBA1C 5.5 03/31/2023   HGBA1C 5.0 08/15/2016   Lab Results  Component Value Date    INSULIN 18.0 03/31/2023   Lab Results  Component Value Date   TSH 0.400 (L) 03/31/2023   CBC    Component Value Date/Time   WBC 7.9 03/31/2023 0820   WBC 7.0 08/11/2022 0814   RBC 4.68 03/31/2023 0820   RBC 4.63 08/11/2022 0814   HGB 13.9 06/10/2023 1137   HGB 13.0 03/31/2023 0820   HCT 41.0 06/10/2023 1137   HCT 40.2 03/31/2023 0820   PLT 216 03/31/2023 0820   MCV 86 03/31/2023 0820   MCH 27.8 03/31/2023 0820   MCH 27.6 08/11/2022 0814   MCHC 32.3 03/31/2023 0820   MCHC 32.9 08/11/2022 0814   RDW 13.2 03/31/2023 0820   Iron Studies No results found for: "IRON", "TIBC", "FERRITIN", "IRONPCTSAT" Lipid Panel     Component Value Date/Time   CHOL 177 03/31/2023 0820   TRIG 61 03/31/2023 0820   HDL 63 03/31/2023 0820   CHOLHDL 2.7 04/07/2022 1541   LDLCALC 102 (H) 03/31/2023 0820   Hepatic Function Panel     Component Value Date/Time   PROT 6.7 03/31/2023 0820   ALBUMIN 4.5 03/31/2023 0820   AST 21 03/31/2023 0820   ALT 16 03/31/2023 0820   ALKPHOS 84 03/31/2023 0820   BILITOT 0.6 03/31/2023 0820      Component Value Date/Time   TSH 0.400 (L) 03/31/2023 0820   Nutritional Lab Results  Component Value Date   VD25OH 30.3 03/31/2023    Attestations:   I, Clinical biochemist, acting as a Stage manager for [employer], DO., have compiled all relevant documentation for today's office visit on behalf of Thomasene Lot, DO, while in the presence of [employer], DO.  Reviewed by clinician on day of visit: allergies, medications, problem list, medical history, surgical history, family history, social history, and previous encounter notes pertinent to patient's obesity diagnosis. preparing to see patient (e.g. review and interpretation of tests, old notes ), obtaining and/or reviewing separately obtained history, performing a medically appropriate examination or evaluation, counseling and educating the patient, ordering medications, test or procedures, documenting  clinical information in the electronic or other health care record, and independently interpreting results and communicating results to the patient, family, or caregiver   I have reviewed the above documentation for accuracy and completeness, and I agree with the above. Betty Hayes, D.O.  The 21st Century Cures Act was signed into law in 2016 which includes the topic of electronic health records.  This provides immediate access to information in MyChart.  This includes consultation notes, operative notes, office notes, lab results and pathology reports.  If you have any questions about what you read please let us know at your next visit so we can discuss your concerns and take corrective  action if need be.  We are right here with you.

## 2023-06-20 ENCOUNTER — Other Ambulatory Visit (HOSPITAL_COMMUNITY): Payer: Self-pay

## 2023-06-23 ENCOUNTER — Other Ambulatory Visit: Payer: Self-pay | Admitting: Urology

## 2023-06-23 ENCOUNTER — Other Ambulatory Visit (HOSPITAL_COMMUNITY): Payer: Self-pay

## 2023-06-25 ENCOUNTER — Other Ambulatory Visit (HOSPITAL_COMMUNITY): Payer: Self-pay

## 2023-06-29 ENCOUNTER — Other Ambulatory Visit (HOSPITAL_COMMUNITY): Payer: Self-pay

## 2023-06-29 ENCOUNTER — Encounter (INDEPENDENT_AMBULATORY_CARE_PROVIDER_SITE_OTHER): Payer: Self-pay | Admitting: Family Medicine

## 2023-06-29 ENCOUNTER — Ambulatory Visit (INDEPENDENT_AMBULATORY_CARE_PROVIDER_SITE_OTHER): Payer: BC Managed Care – PPO | Admitting: Family Medicine

## 2023-06-29 ENCOUNTER — Telehealth (INDEPENDENT_AMBULATORY_CARE_PROVIDER_SITE_OTHER): Payer: BC Managed Care – PPO | Admitting: Psychology

## 2023-06-29 VITALS — BP 121/80 | HR 80 | Temp 98.6°F | Ht 66.0 in | Wt 218.0 lb

## 2023-06-29 DIAGNOSIS — Z6835 Body mass index (BMI) 35.0-35.9, adult: Secondary | ICD-10-CM | POA: Diagnosis not present

## 2023-06-29 DIAGNOSIS — F5089 Other specified eating disorder: Secondary | ICD-10-CM

## 2023-06-29 DIAGNOSIS — E559 Vitamin D deficiency, unspecified: Secondary | ICD-10-CM

## 2023-06-29 DIAGNOSIS — E669 Obesity, unspecified: Secondary | ICD-10-CM

## 2023-06-29 DIAGNOSIS — E88819 Insulin resistance, unspecified: Secondary | ICD-10-CM | POA: Diagnosis not present

## 2023-06-29 DIAGNOSIS — E66812 Obesity, class 2: Secondary | ICD-10-CM

## 2023-06-29 DIAGNOSIS — F909 Attention-deficit hyperactivity disorder, unspecified type: Secondary | ICD-10-CM

## 2023-06-29 DIAGNOSIS — F419 Anxiety disorder, unspecified: Secondary | ICD-10-CM | POA: Diagnosis not present

## 2023-06-29 MED ORDER — VITAMIN D3 50 MCG (2000 UT) PO TABS: 4000.0000 [IU] | ORAL_TABLET | Freq: Every day | ORAL | Status: DC

## 2023-06-29 MED ORDER — METFORMIN HCL ER 500 MG PO TB24: ORAL_TABLET | ORAL | 0 refills | Status: DC

## 2023-06-29 NOTE — Progress Notes (Signed)
  Office: [phone]  /  Fax: [phone]    Date: June 29, 2023  Appointment Start Time: 2:02pm Duration: 30 minutes Provider: Lawerance Cruel, Psy.D. Type of Session: Individual Therapy  Location of Patient: Parked in car outside [facility] clinic (private location) Location of Provider: Provider's Home (private office) Type of Contact: Telepsychological Visit via MyChart Video Visit  Session Content: Betty Hayes is a 37 y.o. female presenting for a follow-up appointment to address the previously established treatment goal of increasing coping skills.Today's appointment was a telepsychological visit. Betty Hayes provided verbal consent for today's telepsychological appointment and she is aware she is responsible for securing confidentiality on her end of the session. Prior to proceeding with today's appointment, Betty Hayes's physical location at the time of this appointment was obtained as well a phone number she could be reached at in the event of technical difficulties. Betty Hayes and this provider participated in today's telepsychological service.   This provider conducted a brief check-in. Betty Hayes shared about recent events, including Thanksgiving. Reflected on what went well during Thanksgiving as it relates to eating and what she can do differently during future holidays. She expressed concern regarding her ability to lose weight due to holiday celebrations. To assist with coping, psychoeducation provided regarding mindful eating strategies (sit down, slowly chew, savor, simplify, and smile). Overall, Betty Hayes was receptive to today's appointment as evidenced by openness to sharing, responsiveness to feedback, and willingness to implement discussed strategies .  Mental Status Examination:  Appearance: neat Behavior: appropriate to circumstances Mood: neutral Affect: mood congruent Speech: WNL Eye Contact: appropriate Psychomotor Activity: WNL Gait: unable to assess  Thought Process: linear, logical, and  goal directed and no evidence or endorsement of suicidal, homicidal, and self-harm ideation, plan and intent  Thought Content/Perception: no hallucinations, delusions, bizarre thinking or behavior endorsed or observed Orientation: AAOx4 Memory/Concentration: intact Insight/Judgment: fair  Interventions:  Conducted a brief chart review Provided empathic reflections and validation Provided positive reinforcement Employed supportive psychotherapy interventions to facilitate reduced distress and to improve coping skills with identified stressors Psychoeducation provided regarding mindfulness  DSM-5 Diagnosis(es):  F50.89 Other Specified Feeding or Eating Disorder, Emotional Eating Behaviors, F41.9 Unspecified Anxiety Disorder, and F90.9 Unspecified Attention-Deficit/Hyperactivity Disorder   Treatment Goal & Progress: During the initial appointment with this provider, the following treatment goal was established: increase coping skills. Betty Hayes has demonstrated progress in her goal as evidenced by increased awareness of hunger patterns.   Plan: The next appointment is scheduled for 07/20/2023 at 2pm, which will be via MyChart Video Visit. The next session will focus on working towards the established treatment goal.

## 2023-06-29 NOTE — Progress Notes (Signed)
Carlye Grippe, D.O.  ABFM, ABOM Specializing in Clinical Bariatric Medicine  Office located at: [address] [place], [place]  [address]   Assessment and Plan:   Come to next visit fasting for lab work. Check vitamin D, B12, magnesium, BMP, and kidney function (since she is starting Metformin today). If pt is fasting, check fasting labs (insulin and cholesterol).   FOR THE DISEASE OF OBESITY: Obesity with BMI of 35.5 BMI 35.0-35.9,adult current BMi 35.2 Assessment & Plan: Since last office visit on 11/25 patient's  Muscle mass has decreased by 1.2lb. Fat mass has increased by 5.2lb. Total body water has increased by 2.6lb.  Counseling done on how various foods will affect these numbers and how to maximize success  Total lbs lost to date: 2 lbs Total weight loss percentage to date: -0.91 %   Recommended Dietary Goals Betty Hayes is currently in the action stage of change. As such, her goal is to continue weight management plan.  She has agreed to: continue current plan   Behavioral Intervention We discussed the following today: eating on plan (especially while taking Metformin), continue journaling, remove unhealthy foods from environment, prioritizing following her meal plan.   Additional resources provided today: None  Evidence-based interventions for health behavior change were utilized today including the discussion of self monitoring techniques, problem-solving barriers and SMART goal setting techniques.   Regarding patient's less desirable eating habits and patterns, we employed the technique of small changes.   Pt will specifically work on: Remove unhealthy foods from the home for next visit.    Recommended Physical Activity Goals Betty Hayes has been advised to work up to 150 minutes of moderate intensity aerobic activity a week and strengthening exercises 2-3 times per week for cardiovascular health, weight loss maintenance and preservation of muscle mass.   She has  agreed to :  Think about enjoyable ways to increase daily physical activity and overcoming barriers to exercise  Pharmacotherapy We discussed various medication options to help Betty Hayes with her weight loss efforts and we both agreed to : continue with nutritional and behavioral strategies and take Metformin 500 mg once daily with lunch.   FOR ASSOCIATED CONDITIONS ADDRESSED TODAY: Insulin resistance Assessment & Plan: Lab Results  Component Value Date   HGBA1C 5.5 03/31/2023   HGBA1C 5.0 08/15/2016   INSULIN 18.0 03/31/2023   Pt previously started Metformin (half tab with lunch), but missed multiple doses due to it being new to her routine. Whenever she starts any new meds she endorses acid reflux for about the first 2 weeks. Per pt those sym have already started to improve.   We discussed the option of increasing Metformin dose, but because pt is unlikely to take doses due to it being a new regimen, I recommend she take one 500 mg tablet once daily to avoid taking BID. When pt is adjusted to taking her medication regularly, we will revisit the option of increasing her dose to BID. Reviewed possible side effects if eating off-plan when taking metformin. Addressed the importance of eating on-plan to avoid side effects. Continue to monitor condition as it relates to her weight loss journey.   Orders: -     metFORMIN HCl ER; one tab po q lunch * 1 week, then inc to 2 tabs q lunch  Dispense: 60 tablet; Refill: 0   Vitamin D deficiency Assessment & Plan: Lab Results  Component Value Date   VD25OH 30.3 03/31/2023   At her last visit, her recommended  vitamin D supplementation was doubled. Currently taking 4000 IUs of OTC vitamin D daily. Tolerating well. Vitamin D level 3 months ago was below goal, at 30.3. Reviewed ideal vitamin D levels of 50+ with patient. Continue on current regimen. Will refill vitamin D OTC today.  Orders: -     Vitamin D3; Take 2 tablets (4,000 Units total) by mouth  daily.   Follow up:   Return in about 3 weeks (around 07/20/2023). She was informed of the importance of frequent follow up visits to maximize her success with intensive lifestyle modifications for her multiple health conditions.  Subjective:   Chief complaint: Obesity Betty Hayes is here to discuss her progress with her obesity treatment plan. She is on the Category 2 Plan and keeping a food journal and adhering to recommended goals of 1100-1200 calories and 85+g protein and 100 snack calories and states she is following her eating plan approximately 50% of the time. She states she is not exercising.  Interval History:  Betty Hayes is here for a follow up office visit. Since last OV,  she is up 4 lbs. On thanksgiving day she did not eat on-plan, but she ate more meat than she usually eats and had smaller portion sizes. Though she ate pie, she notes she had a smaller amount of pie than she usually does. Meeting her protein intake goal 100-110g of protein. No calorie deficit during the holidays - still keeping track of calories in journal. Does not snack much. Ate left overs for about 4-5 days after thanksgiving   Pt has inpatient right pyeloplasty on 1/10, pt is unsure of duration of recovery period.   Barriers identified: none, having difficulty focusing on healthy eating, and low volume of physical activity at present .   Pharmacotherapy for weight loss: She is currently taking no anti-obesity medication.   Review of Systems:  Pertinent positives were addressed with patient today.  Reviewed by clinician on day of visit: allergies, medications, problem list, medical history, surgical history, family history, social history, and previous encounter notes.  Weight Summary and Biometrics   Since Last Visit: 0 lb  Weight Gained Since Last Visit: 4lb   Vitals Temp: 98.6 F (37C) BP: 121/80  Pulse Rate: 80  SpO2: 99%   Anthropometric Measurements Height: 5\' 6"  (1.676 m) Weight: 218  lb (98.9 kg) BMI (Calculated): 35.2 Weight at Last Visit: 214lb Weight Lost Since Last Visit: 0lb Weight Gained Since Last Visit:  4lb Starting Weight:  220lb Total Weight Loss (lbs): 2 lb (0.907 kg) Peak Weight: 213lb   Body Composition Body Fat %: 43.6 % Fat Mass (lbs): 95 lbs Muscle Mass (lbs): 116.8 lbs Total Body Water (lbs): 84.4 lbs Visceral Fat Rating :  10   Other Clinical Data Fasting: no Labs: no Today's Visit #: 6 Starting Date: 04/14/2023   Objective:   PHYSICAL EXAM: Blood pressure 121/80, pulse 80 , temperature 98.6 F, height 5\' 6" , weight 218 lb, SpO2 99%. Body mass index is 35.2 kg/m.  General: she is overweight, cooperative and in no acute distress. PSYCH: Has normal mood, affect and thought process.   HEENT: EOMI, sclerae are anicteric. Lungs: Normal breathing effort, no conversational dyspnea. Extremities: Moves * 4 Neurologic: A and O * 3, good insight  DIAGNOSTIC DATA REVIEWED: BMET    Component Value Date/Time   NA 140 06/10/2023 1137   NA 140 03/31/2023 0820   K 4.0 06/10/2023 1137   CL 103 06/10/2023 1137   CO2 20 03/31/2023  0820   GLUCOSE 84 06/10/2023 1137   BUN 15 06/10/2023 1137   BUN 14 03/31/2023 0820   CREATININE 0.80 06/10/2023 1137   CALCIUM 9.0 03/31/2023 0820   GFRNONAA >60 08/11/2022 0814   GFRAA >60 01/24/2020 0814   Lab Results  Component Value Date   HGBA1C 5.5 03/31/2023   HGBA1C 5.0 08/15/2016   Lab Results  Component Value Date   INSULIN 18.0 03/31/2023   Lab Results  Component Value Date   TSH 0.400 (L) 03/31/2023   CBC    Component Value Date/Time   WBC 7.9 03/31/2023 0820   WBC 7.0 08/11/2022 0814   RBC 4.68 03/31/2023 0820   RBC 4.63 08/11/2022 0814   HGB 13.9 06/10/2023 1137   HGB 13.0 03/31/2023 0820   HCT 41.0 06/10/2023 1137   HCT 40.2 03/31/2023 0820   PLT 216 03/31/2023 0820   MCV 86 03/31/2023 0820   MCH 27.8 03/31/2023 0820   MCH 27.6 08/11/2022 0814   MCHC 32.3 03/31/2023 0820    MCHC 32.9 08/11/2022 0814   RDW 13.2 03/31/2023 0820   Iron Studies No results found for: "IRON", "TIBC", "FERRITIN", "IRONPCTSAT" Lipid Panel     Component Value Date/Time   CHOL 177 03/31/2023 0820   TRIG 61 03/31/2023 0820   HDL 63 03/31/2023 0820   CHOLHDL 2.7 04/07/2022 1541   LDLCALC 102 (H) 03/31/2023 0820   Hepatic Function Panel     Component Value Date/Time   PROT 6.7 03/31/2023 0820   ALBUMIN 4.5 03/31/2023 0820   AST 21 03/31/2023 0820   ALT 16 03/31/2023 0820   ALKPHOS 84 03/31/2023 0820   BILITOT 0.6 03/31/2023 0820      Component Value Date/Time   TSH 0.400 (L) 03/31/2023 0820   Nutritional Lab Results  Component Value Date   VD25OH 30.3 03/31/2023    Attestations:   I, Isabelle Course, acting as a medical scribe for Thomasene Lot, DO., have compiled all relevant documentation for today's office visit on behalf of Thomasene Lot, DO, while in the presence of [employer], DO.  Reviewed by clinician on day of visit: allergies, medications, problem list, medical history, surgical history, family history, social history, and previous encounter notes pertinent to patient's obesity diagnosis. I have reviewed the above documentation for accuracy and completeness, and I agree with the above. Carlye Grippe, D.O.  The 21st Century Cures Act was signed into law in 2016 which includes the topic of electronic health records.  This provides immediate access to information in MyChart.  This includes consultation notes, operative notes, office notes, lab results and pathology reports.  If you have any questions about what you read please let us know at your next visit so we can discuss your concerns and take corrective action if need be.  We are right here with you.

## 2023-07-13 ENCOUNTER — Encounter (HOSPITAL_COMMUNITY): Payer: BC Managed Care – PPO

## 2023-07-20 ENCOUNTER — Telehealth (INDEPENDENT_AMBULATORY_CARE_PROVIDER_SITE_OTHER): Payer: BC Managed Care – PPO | Admitting: Psychology

## 2023-07-20 DIAGNOSIS — F5089 Other specified eating disorder: Secondary | ICD-10-CM

## 2023-07-20 DIAGNOSIS — F419 Anxiety disorder, unspecified: Secondary | ICD-10-CM

## 2023-07-20 DIAGNOSIS — F909 Attention-deficit hyperactivity disorder, unspecified type: Secondary | ICD-10-CM | POA: Diagnosis not present

## 2023-07-20 NOTE — Progress Notes (Addendum)
 COVID Vaccine received:  []  No [x]  Yes Date of any COVID positive Test in last 90 days: no PCP - Fonda Dettinger MD Cardiologist - none  Chest x-ray -  EKG -  03/31/23 Epic Stress Test -  ECHO -  Cardiac Cath -   Bowel Prep - [x]  No  []   Yes ______  Pacemaker / ICD device [x]  No []  Yes   Spinal Cord Stimulator:[x]  No []  Yes       History of Sleep Apnea? [x]  No []  Yes   CPAP used?- [x]  No []  Yes    Does the patient monitor blood sugar?          [x]  No []  Yes  []  N/A  Patient has: [x]  NO Hx DM   []  Pre-DM                 []  DM1  []   DM2 Does patient have a [employer] or [employer]om? []  No []  Yes   Fasting Blood Sugar Ranges-  Checks Blood Sugar _____ times a day  GLP1 agonist / usual dose - no GLP1 instructions:  SGLT-2 inhibitors / usual dose - no SGLT-2 instructions:   Blood Thinner / Instructions:no Aspirin Instructions:no  Comments:   Activity level: Patient is able  to climb a flight of stairs without difficulty; [x]  No CP  [x]  No SOB, ___   Patient can perform ADLs without assistance.   Anesthesia review: murmur, HTN, insulin  resistance  Patient denies shortness of breath, fever, cough and chest pain at PAT appointment.  Patient verbalized understanding and agreement to the Pre-Surgical Instructions that were given to them at this PAT appointment. Patient was also educated of the need to review these PAT instructions again prior to his/her surgery.I reviewed the appropriate phone numbers to call if they have any and questions or concerns.

## 2023-07-20 NOTE — Progress Notes (Signed)
  Office: [phone]  /  Fax: [phone]    Date: July 20, 2023  Appointment Start Time: 2:02pm Duration: 25 minutes Provider: Lawerance Cruel, Psy.D. Type of Session: Individual Therapy  Location of Patient: Work (private location) Location of Provider: Provider's Home (private office) Type of Contact: Telepsychological Visit via MyChart Video Visit  Session Content: Betty Hayes is a 37 y.o. female presenting for a follow-up appointment to address the previously established treatment goal of increasing coping skills.Today's appointment was a telepsychological visit. Dariona provided verbal consent for today's telepsychological appointment and she is aware she is responsible for securing confidentiality on her end of the session. Prior to proceeding with today's appointment, [name]'s physical location at the time of this appointment was obtained as well a phone number she could be reached at in the event of technical difficulties. Zaina and this provider participated in today's telepsychological service.   This provider conducted a brief check-in. [name] shared about recent events, noting she has surgery coming up. Reviewed previously discussed mindful eating strategies. She reflected she is regularly sitting down to eat. Psychoeducation regarding triggers for emotional eating was provided. [name] was provided a handout, and encouraged to utilize the handout between now and the next appointment to increase awareness of triggers and frequency. [name] agreed. This provider also discussed behavioral strategies for specific triggers, such as placing the utensil down when conversing to avoid mindless eating. Melika provided verbal consent during today's appointment for this provider to send a handout about triggers via e-mail. Overall, [name] was receptive to today's appointment as evidenced by openness to sharing, responsiveness to feedback, and willingness to explore triggers for emotional  eating.  Mental Status Examination:  Appearance: neat Behavior: appropriate to circumstances Mood: neutral Affect: mood congruent Speech: WNL Eye Contact: appropriate Psychomotor Activity: WNL Gait: unable to assess Thought Process: linear, logical, and goal directed and no evidence or endorsement of suicidal, homicidal, and self-harm ideation, plan and intent  Thought Content/Perception: no hallucinations, delusions, bizarre thinking or behavior endorsed or observed Orientation: AAOx4 Memory/Concentration: intact Insight: fair Judgment: fair  Interventions:  Conducted a brief chart review Provided empathic reflections and validation Reviewed content from the previous session Provided positive reinforcement Employed supportive psychotherapy interventions to facilitate reduced distress and to improve coping skills with identified stressors Psychoeducation provided regarding triggers for emotional eating behaviors  DSM-5 Diagnosis(es):  F50.89 Other Specified Feeding or Eating Disorder, Emotional Eating Behaviors, F41.9 Unspecified Anxiety Disorder, and F90.9 Unspecified Attention-Deficit/Hyperactivity Disorder   Treatment Goal & Progress: During the initial appointment with this provider, the following treatment goal was established: increase coping skills. [name] has demonstrated progress in her goal as evidenced by increased awareness of hunger patterns. [name] also continues to demonstrate willingness to engage in learned skill(s).  Plan: The next appointment is scheduled for 08/18/2023 at 2pm, which will be via MyChart Video Visit. The next session will focus on working towards the established treatment goal.   Lawerance Cruel, PsyD

## 2023-07-20 NOTE — Patient Instructions (Signed)
 SURGICAL WAITING ROOM VISITATION  Patients having surgery or a procedure may have no more than 2 support people in the waiting area - these visitors may rotate.    Children under the age of 81 must have an adult with them who is not the patient.  Due to an increase in RSV and influenza rates and associated hospitalizations, children ages 73 and under may not visit patients in [facility] hospitals.  If the patient needs to stay at the hospital during part of their recovery, the visitor guidelines for inpatient rooms apply. Pre-op nurse will coordinate an appropriate time for 1 support person to accompany patient in pre-op.  This support person may not rotate.    Please refer to the [facility] website for the visitor guidelines for Inpatients (after your surgery is over and you are in a regular room).       Your procedure is scheduled on: 07/31/23   Report to [facility] Main Entrance    Report to admitting at 11:30 AM   Call this number if you have problems the morning of surgery [phone]   Do not eat food  or drink liquids :After Midnight.      Oral Hygiene is also important to reduce your risk of infection.                                    Remember - BRUSH YOUR TEETH THE MORNING OF SURGERY WITH YOUR REGULAR TOOTHPASTE   Stop all vitamins and herbal supplements 7 days before surgery.   Take these medicines the morning of surgery with A SIP OF WATER : Cytomel , Pantoprazole , Synthroid  88              Do not take Lisinopril  the day of surgery.  DO NOT TAKE ANY ORAL DIABETIC MEDICATIONS DAY OF YOUR SURGERY Do not take Metformin  the day of surgery.              You may not have any metal on your body including hair pins, jewelry, and body piercing             Do not wear make-up, lotions, powders, perfumes/cologne, or deodorant  Do not wear nail polish including gel and S&S, artificial/acrylic nails, or any other type of covering on natural nails including finger  and toenails. If you have artificial nails, gel coating, etc. that needs to be removed by a nail salon please have this removed prior to surgery or surgery may need to be canceled/ delayed if the surgeon/ anesthesia feels like they are unable to be safely monitored.   Do not shave  48 hours prior to surgery.    Do not bring valuables to the hospital. [facility] IS NOT             RESPONSIBLE   FOR VALUABLES.   Contacts, glasses, dentures or bridgework may not be worn into surgery.   Bring small overnight bag day of surgery.   DO NOT BRING YOUR HOME MEDICATIONS TO THE HOSPITAL. PHARMACY WILL DISPENSE MEDICATIONS LISTED ON YOUR MEDICATION LIST TO YOU DURING YOUR ADMISSION IN THE HOSPITAL!    Patients discharged on the day of surgery will not be allowed to drive home.  Someone NEEDS to stay with you for the first 24 hours after anesthesia.   Special Instructions: Bring a copy of your healthcare power of attorney and living will documents the day  of surgery if you haven't scanned them before.              Please read over the following fact sheets you were given: IF YOU HAVE QUESTIONS ABOUT YOUR PRE-OP INSTRUCTIONS PLEASE CALL [phone] [place]   If you received a COVID test during your pre-op visit  it is requested that you wear a mask when out in public, stay away from anyone that may not be feeling well and notify your surgeon if you develop symptoms. If you test positive for Covid or have been in contact with anyone that has tested positive in the last 10 days please notify you surgeon.    [facility] - Preparing for Surgery Before surgery, you can play an important role.  Because skin is not sterile, your skin needs to be as free of germs as possible.  You can reduce the number of germs on your skin by washing with CHG (chlorahexidine gluconate) soap before surgery.  CHG is an antiseptic cleaner which kills germs and bonds with the skin to continue killing germs even after  washing. Please DO NOT use if you have an allergy to CHG or antibacterial soaps.  If your skin becomes reddened/irritated stop using the CHG and inform your nurse when you arrive at Short Stay. Do not shave (including legs and underarms) for at least 48 hours prior to the first CHG shower.  You may shave your face/neck.  Please follow these instructions carefully:  1.  Shower with CHG Soap the night before surgery and the  morning of surgery.  2.  If you choose to wash your hair, wash your hair first as usual with your normal  shampoo.  3.  After you shampoo, rinse your hair and body thoroughly to remove the shampoo.                             4.  Use CHG as you would any other liquid soap.  You can apply chg directly to the skin and wash.  Gently with a scrungie or clean washcloth.  5.  Apply the CHG Soap to your body ONLY FROM THE NECK DOWN.   Do   not use on face/ open                           Wound or open sores. Avoid contact with eyes, ears mouth and   genitals (private parts).                       Wash face,  Genitals (private parts) with your normal soap.             6.  Wash thoroughly, paying special attention to the area where your    surgery  will be performed.  7.  Thoroughly rinse your body with warm water  from the neck down.  8.  DO NOT shower/wash with your normal soap after using and rinsing off the CHG Soap.                9.  Pat yourself dry with a clean towel.            10.  Wear clean pajamas.            11.  Place clean sheets on your bed the night of your first shower and do not  sleep with pets. Day  of Surgery : Do not apply any lotions/deodorants the morning of surgery.  Please wear clean clothes to the hospital/surgery center.  FAILURE TO FOLLOW THESE INSTRUCTIONS MAY RESULT IN THE CANCELLATION OF YOUR SURGERY  PATIENT SIGNATURE_________________________________  NURSE  SIGNATURE__________________________________  ________________________________________________________________________

## 2023-07-21 ENCOUNTER — Encounter (HOSPITAL_COMMUNITY)
Admission: RE | Admit: 2023-07-21 | Discharge: 2023-07-21 | Disposition: A | Discharge status: Home / Self Care | Payer: BC Managed Care – PPO | Source: Ambulatory Visit | Attending: Urology | Admitting: Urology

## 2023-07-21 ENCOUNTER — Other Ambulatory Visit: Payer: Self-pay

## 2023-07-21 ENCOUNTER — Encounter (HOSPITAL_COMMUNITY): Payer: Self-pay

## 2023-07-21 DIAGNOSIS — Z01812 Encounter for preprocedural laboratory examination: Secondary | ICD-10-CM | POA: Diagnosis not present

## 2023-07-21 DIAGNOSIS — Z01818 Encounter for other preprocedural examination: Secondary | ICD-10-CM | POA: Diagnosis not present

## 2023-07-21 HISTORY — DX: Personal history of urinary calculi: Z87.442

## 2023-07-21 LAB — BASIC METABOLIC PANEL
Anion gap: 8 (ref 5–15)
BUN: 19 mg/dL (ref 6–20)
CO2: 22 mmol/L (ref 22–32)
Calcium: 9.3 mg/dL (ref 8.9–10.3)
Chloride: 104 mmol/L (ref 98–111)
Creatinine, Ser: 0.63 mg/dL (ref 0.44–1.00)
GFR, Estimated: >60 mL/min (ref >60)
Glucose, Bld: 89 mg/dL (ref 70–99)
Potassium: 4 mmol/L (ref 3.5–5.1)
Sodium: 134 mmol/L — ABNORMAL LOW (ref 135–145)

## 2023-07-21 LAB — CBC
HCT: 40.7 % (ref 36.0–46.0)
Hemoglobin: 13.2 g/dL (ref 12.0–15.0)
MCH: 27.3 pg (ref 26.0–34.0)
MCHC: 32.4 g/dL (ref 30.0–36.0)
MCV: 84.3 fL (ref 80.0–100.0)
Platelets: 231 10*3/uL (ref 150–400)
RBC: 4.83 MIL/uL (ref 3.87–5.11)
RDW: 12.8 % (ref 11.5–15.5)
WBC: 10.2 10*3/uL (ref 4.0–10.5)
nRBC: 0 % (ref 0.0–0.2)

## 2023-07-24 DIAGNOSIS — R8271 Bacteriuria: Secondary | ICD-10-CM | POA: Diagnosis not present

## 2023-07-24 DIAGNOSIS — N130 Hydronephrosis with ureteropelvic junction obstruction: Secondary | ICD-10-CM | POA: Diagnosis not present

## 2023-07-24 DIAGNOSIS — Q6211 Congenital occlusion of ureteropelvic junction: Secondary | ICD-10-CM | POA: Diagnosis not present

## 2023-07-28 ENCOUNTER — Other Ambulatory Visit (INDEPENDENT_AMBULATORY_CARE_PROVIDER_SITE_OTHER): Payer: Self-pay | Admitting: Family Medicine

## 2023-07-28 DIAGNOSIS — E88819 Insulin resistance, unspecified: Secondary | ICD-10-CM

## 2023-07-31 ENCOUNTER — Observation Stay (HOSPITAL_COMMUNITY): Payer: BC Managed Care – PPO

## 2023-07-31 ENCOUNTER — Encounter (HOSPITAL_COMMUNITY)
Admission: RE | Disposition: A | Discharge status: Home / Self Care | Payer: Self-pay | Source: Ambulatory Visit | Attending: Urology

## 2023-07-31 ENCOUNTER — Other Ambulatory Visit: Payer: Self-pay

## 2023-07-31 ENCOUNTER — Observation Stay (HOSPITAL_COMMUNITY)
Admission: RE | Admit: 2023-07-31 | Discharge: 2023-08-02 | Disposition: A | Discharge status: Home / Self Care | Payer: BC Managed Care – PPO | Source: Ambulatory Visit | Attending: Urology | Admitting: Urology

## 2023-07-31 ENCOUNTER — Ambulatory Visit (HOSPITAL_COMMUNITY): Payer: BC Managed Care – PPO | Admitting: Anesthesiology

## 2023-07-31 ENCOUNTER — Encounter (HOSPITAL_COMMUNITY): Payer: Self-pay | Admitting: Urology

## 2023-07-31 DIAGNOSIS — N130 Hydronephrosis with ureteropelvic junction obstruction: Secondary | ICD-10-CM | POA: Diagnosis not present

## 2023-07-31 DIAGNOSIS — Z79899 Other long term (current) drug therapy: Secondary | ICD-10-CM | POA: Diagnosis not present

## 2023-07-31 DIAGNOSIS — I10 Essential (primary) hypertension: Secondary | ICD-10-CM | POA: Insufficient documentation

## 2023-07-31 DIAGNOSIS — Z960 Presence of urogenital implants: Secondary | ICD-10-CM | POA: Diagnosis not present

## 2023-07-31 DIAGNOSIS — E039 Hypothyroidism, unspecified: Secondary | ICD-10-CM | POA: Insufficient documentation

## 2023-07-31 DIAGNOSIS — N135 Crossing vessel and stricture of ureter without hydronephrosis: Principal | ICD-10-CM | POA: Diagnosis present

## 2023-07-31 DIAGNOSIS — Z7984 Long term (current) use of oral hypoglycemic drugs: Secondary | ICD-10-CM | POA: Insufficient documentation

## 2023-07-31 HISTORY — PX: ROBOT ASSISTED PYELOPLASTY: SHX5143

## 2023-07-31 LAB — TYPE AND SCREEN
ABO/RH(D): A NEG
Antibody Screen: NEGATIVE

## 2023-07-31 LAB — ABO/RH: ABO/RH(D): A NEG

## 2023-07-31 LAB — BASIC METABOLIC PANEL
Anion gap: 8 (ref 5–15)
BUN: 12 mg/dL (ref 6–20)
CO2: 20 mmol/L — ABNORMAL LOW (ref 22–32)
Calcium: 8.6 mg/dL — ABNORMAL LOW (ref 8.9–10.3)
Chloride: 107 mmol/L (ref 98–111)
Creatinine, Ser: 0.55 mg/dL (ref 0.44–1.00)
GFR, Estimated: >60 mL/min (ref >60)
Glucose, Bld: 127 mg/dL — ABNORMAL HIGH (ref 70–99)
Potassium: 4.3 mmol/L (ref 3.5–5.1)
Sodium: 135 mmol/L (ref 135–145)

## 2023-07-31 LAB — GLUCOSE, CAPILLARY
Glucose-Capillary: 97 mg/dL (ref 70–99)
Glucose-Capillary: 109 mg/dL — ABNORMAL HIGH (ref 70–99)

## 2023-07-31 LAB — HEMOGLOBIN AND HEMATOCRIT, BLOOD
HCT: 38.3 % (ref 36.0–46.0)
Hemoglobin: 13 g/dL (ref 12.0–15.0)

## 2023-07-31 SURGERY — PYELOPLASTY, ROBOT-ASSISTED
Anesthesia: General | Laterality: Right

## 2023-07-31 MED ORDER — PROPOFOL 1000 MG/100ML IV EMUL
INTRAVENOUS | Status: AC
Start: 2023-07-31 — End: ?
  Filled 2023-07-31: qty 100

## 2023-07-31 MED ORDER — LIDOCAINE HCL (CARDIAC) PF 100 MG/5ML IV SOSY
PREFILLED_SYRINGE | INTRAVENOUS | Status: DC | PRN
  Administered 2023-07-31: 80 mg via INTRAVENOUS

## 2023-07-31 MED ORDER — ROCURONIUM BROMIDE 10 MG/ML (PF) SYRINGE
PREFILLED_SYRINGE | INTRAVENOUS | Status: AC
  Filled 2023-07-31: qty 10

## 2023-07-31 MED ORDER — SODIUM CHLORIDE (PF) 0.9 % IJ SOLN
INTRAMUSCULAR | Status: DC | PRN
  Administered 2023-07-31: 10 mL

## 2023-07-31 MED ORDER — ONDANSETRON HCL 4 MG/2ML IJ SOLN: 4.0000 mg | INTRAMUSCULAR | Status: DC | PRN

## 2023-07-31 MED ORDER — ORAL CARE MOUTH RINSE: 15.0000 mL | Freq: Once | OROMUCOSAL | Status: AC

## 2023-07-31 MED ORDER — ONDANSETRON HCL 4 MG/2ML IJ SOLN: 4.0000 mg | Freq: Once | INTRAMUSCULAR | Status: DC | PRN

## 2023-07-31 MED ORDER — FENTANYL CITRATE (PF) 100 MCG/2ML IJ SOLN
INTRAMUSCULAR | Status: AC
  Filled 2023-07-31: qty 2

## 2023-07-31 MED ORDER — CEFAZOLIN SODIUM-DEXTROSE 2-4 GM/100ML-% IV SOLN
INTRAVENOUS | Status: AC
  Filled 2023-07-31: qty 100

## 2023-07-31 MED ORDER — MIDAZOLAM HCL 5 MG/5ML IJ SOLN
INTRAMUSCULAR | Status: DC | PRN
  Administered 2023-07-31 (×3): 2 mg via INTRAVENOUS

## 2023-07-31 MED ORDER — HYDROMORPHONE HCL 2 MG/ML IJ SOLN
INTRAMUSCULAR | Status: AC
  Filled 2023-07-31: qty 1

## 2023-07-31 MED ORDER — CHLORHEXIDINE GLUCONATE 0.12 % MT SOLN
15.0000 mL | Freq: Once | OROMUCOSAL | Status: AC
  Administered 2023-07-31: 15 mL via OROMUCOSAL

## 2023-07-31 MED ORDER — WATER FOR IRRIGATION, STERILE IR SOLN
Status: DC | PRN
  Administered 2023-07-31: 1000 mL

## 2023-07-31 MED ORDER — HYDROMORPHONE HCL 1 MG/ML IJ SOLN
INTRAMUSCULAR | Status: DC | PRN
Start: 2023-07-31 — End: 2023-07-31
  Administered 2023-07-31 (×2): .5 mg via INTRAVENOUS

## 2023-07-31 MED ORDER — LACTATED RINGERS IV SOLN
INTRAVENOUS | Status: DC
Start: 2023-07-31 — End: 2023-07-31

## 2023-07-31 MED ORDER — MIDAZOLAM HCL 2 MG/2ML IJ SOLN
INTRAMUSCULAR | Status: AC
  Filled 2023-07-31: qty 2

## 2023-07-31 MED ORDER — PROPOFOL 1000 MG/100ML IV EMUL
INTRAVENOUS | Status: AC
  Filled 2023-07-31: qty 100

## 2023-07-31 MED ORDER — ACETAMINOPHEN 500 MG PO TABS
1000.0000 mg | ORAL_TABLET | Freq: Once | ORAL | Status: AC
  Administered 2023-07-31: 1000 mg via ORAL
  Filled 2023-07-31: qty 2

## 2023-07-31 MED ORDER — ROCURONIUM BROMIDE 100 MG/10ML IV SOLN
INTRAVENOUS | Status: DC | PRN
  Administered 2023-07-31 (×2): 20 mg via INTRAVENOUS
  Administered 2023-07-31: 60 mg via INTRAVENOUS
  Administered 2023-07-31: 20 mg via INTRAVENOUS

## 2023-07-31 MED ORDER — SODIUM CHLORIDE (PF) 0.9 % IJ SOLN
INTRAMUSCULAR | Status: AC
  Filled 2023-07-31: qty 20

## 2023-07-31 MED ORDER — PROPOFOL 10 MG/ML IV BOLUS
INTRAVENOUS | Status: DC | PRN
  Administered 2023-07-31: 160 mg via INTRAVENOUS

## 2023-07-31 MED ORDER — FENTANYL CITRATE PF 50 MCG/ML IJ SOSY
25.0000 ug | PREFILLED_SYRINGE | INTRAMUSCULAR | Status: DC | PRN
  Administered 2023-07-31: 25 ug via INTRAVENOUS

## 2023-07-31 MED ORDER — SODIUM CHLORIDE 0.9 % IV SOLN: INTRAVENOUS | Status: DC | PRN

## 2023-07-31 MED ORDER — INSULIN ASPART 100 UNIT/ML IJ SOLN: 0.0000 [IU] | Freq: Three times a day (TID) | INTRAMUSCULAR | Status: DC

## 2023-07-31 MED ORDER — DOCUSATE SODIUM 100 MG PO CAPS: 100.0000 mg | ORAL_CAPSULE | Freq: Two times a day (BID) | ORAL | 0 refills | Status: DC | PRN

## 2023-07-31 MED ORDER — ACETAMINOPHEN 10 MG/ML IV SOLN
INTRAVENOUS | Status: AC
  Filled 2023-07-31: qty 100

## 2023-07-31 MED ORDER — AMISULPRIDE (ANTIEMETIC) 5 MG/2ML IV SOLN
INTRAVENOUS | Status: AC
  Filled 2023-07-31: qty 4

## 2023-07-31 MED ORDER — BUPIVACAINE LIPOSOME 1.3 % IJ SUSP
INTRAMUSCULAR | Status: DC | PRN
  Administered 2023-07-31: 20 mL

## 2023-07-31 MED ORDER — KETAMINE HCL 50 MG/5ML IJ SOSY
PREFILLED_SYRINGE | INTRAMUSCULAR | Status: AC
  Filled 2023-07-31: qty 5

## 2023-07-31 MED ORDER — ONDANSETRON HCL 4 MG/2ML IJ SOLN
INTRAMUSCULAR | Status: AC
  Filled 2023-07-31: qty 2

## 2023-07-31 MED ORDER — OXYCODONE-ACETAMINOPHEN 5-325 MG PO TABS: 1.0000 | ORAL_TABLET | ORAL | 0 refills | Status: DC | PRN

## 2023-07-31 MED ORDER — HYDROMORPHONE HCL 1 MG/ML IJ SOLN
0.5000 mg | INTRAMUSCULAR | Status: DC | PRN
Start: 2023-07-31 — End: 2023-08-02
  Administered 2023-08-01 – 2023-08-02 (×4): 1 mg via INTRAVENOUS
  Filled 2023-07-31 (×4): qty 1

## 2023-07-31 MED ORDER — FENTANYL CITRATE PF 50 MCG/ML IJ SOSY
PREFILLED_SYRINGE | INTRAMUSCULAR | Status: AC
  Administered 2023-07-31: 25 ug via INTRAVENOUS
  Filled 2023-07-31: qty 1

## 2023-07-31 MED ORDER — ONDANSETRON HCL 4 MG/2ML IJ SOLN
INTRAMUSCULAR | Status: DC | PRN
  Administered 2023-07-31: 4 mg via INTRAVENOUS

## 2023-07-31 MED ORDER — LEVOTHYROXINE SODIUM 88 MCG PO TABS
88.0000 ug | ORAL_TABLET | Freq: Every day | ORAL | Status: DC
  Administered 2023-08-01 – 2023-08-02 (×2): 88 ug via ORAL
  Filled 2023-07-31 (×2): qty 1

## 2023-07-31 MED ORDER — DEXAMETHASONE SODIUM PHOSPHATE 10 MG/ML IJ SOLN
INTRAMUSCULAR | Status: AC
  Filled 2023-07-31: qty 1

## 2023-07-31 MED ORDER — ACETAMINOPHEN 10 MG/ML IV SOLN
1000.0000 mg | Freq: Three times a day (TID) | INTRAVENOUS | Status: AC
  Administered 2023-07-31 – 2023-08-01 (×3): 1000 mg via INTRAVENOUS
  Filled 2023-07-31 (×2): qty 100

## 2023-07-31 MED ORDER — DEXAMETHASONE SODIUM PHOSPHATE 4 MG/ML IJ SOLN
INTRAMUSCULAR | Status: DC | PRN
  Administered 2023-07-31: 8 mg via INTRAVENOUS

## 2023-07-31 MED ORDER — LIOTHYRONINE SODIUM 5 MCG PO TABS
5.0000 ug | ORAL_TABLET | Freq: Every day | ORAL | Status: DC
  Administered 2023-08-01 – 2023-08-02 (×2): 5 ug via ORAL
  Filled 2023-07-31 (×2): qty 1

## 2023-07-31 MED ORDER — OXYCODONE HCL 5 MG PO TABS
5.0000 mg | ORAL_TABLET | ORAL | Status: DC | PRN
  Administered 2023-07-31 – 2023-08-02 (×4): 5 mg via ORAL
  Filled 2023-07-31 (×4): qty 1

## 2023-07-31 MED ORDER — PROPOFOL 500 MG/50ML IV EMUL
INTRAVENOUS | Status: DC | PRN
  Administered 2023-07-31: 150 ug/kg/min via INTRAVENOUS

## 2023-07-31 MED ORDER — LACTATED RINGERS IR SOLN
Status: DC | PRN
  Administered 2023-07-31: 1

## 2023-07-31 MED ORDER — LIDOCAINE HCL (PF) 2 % IJ SOLN
INTRAMUSCULAR | Status: AC
  Filled 2023-07-31: qty 5

## 2023-07-31 MED ORDER — AMISULPRIDE (ANTIEMETIC) 5 MG/2ML IV SOLN
10.0000 mg | Freq: Once | INTRAVENOUS | Status: AC | PRN
  Administered 2023-07-31: 10 mg via INTRAVENOUS

## 2023-07-31 MED ORDER — CEFAZOLIN SODIUM-DEXTROSE 1-4 GM/50ML-% IV SOLN
INTRAVENOUS | Status: DC | PRN
  Administered 2023-07-31: 2 g via INTRAVENOUS

## 2023-07-31 MED ORDER — BUPIVACAINE LIPOSOME 1.3 % IJ SUSP
INTRAMUSCULAR | Status: AC
  Filled 2023-07-31: qty 20

## 2023-07-31 MED ORDER — FENTANYL CITRATE (PF) 100 MCG/2ML IJ SOLN
INTRAMUSCULAR | Status: DC | PRN
  Administered 2023-07-31 (×2): 100 ug via INTRAVENOUS

## 2023-07-31 MED ORDER — KETAMINE HCL 10 MG/ML IJ SOLN
INTRAMUSCULAR | Status: DC | PRN
  Administered 2023-07-31 (×2): 25 mg via INTRAVENOUS

## 2023-07-31 MED ORDER — DOCUSATE SODIUM 100 MG PO CAPS
100.0000 mg | ORAL_CAPSULE | Freq: Two times a day (BID) | ORAL | Status: DC
  Administered 2023-07-31 – 2023-08-02 (×4): 100 mg via ORAL
  Filled 2023-07-31 (×4): qty 1

## 2023-07-31 MED ORDER — SCOPOLAMINE 1 MG/3DAYS TD PT72
1.0000 | MEDICATED_PATCH | Freq: Once | TRANSDERMAL | Status: DC
  Administered 2023-07-31: 1.5 mg via TRANSDERMAL
  Filled 2023-07-31: qty 1

## 2023-07-31 MED ORDER — SODIUM CHLORIDE 0.9 % IV SOLN: INTRAVENOUS | Status: AC

## 2023-07-31 MED ORDER — SUGAMMADEX SODIUM 200 MG/2ML IV SOLN
INTRAVENOUS | Status: DC | PRN
  Administered 2023-07-31: 200 mg via INTRAVENOUS
  Administered 2023-07-31: 100 mg via INTRAVENOUS

## 2023-07-31 MED ORDER — ONDANSETRON HCL 4 MG PO TABS: 4.0000 mg | ORAL_TABLET | Freq: Every day | ORAL | 1 refills | Status: AC | PRN

## 2023-07-31 MED ORDER — PANTOPRAZOLE SODIUM 40 MG PO TBEC: 40.0000 mg | DELAYED_RELEASE_TABLET | Freq: Every day | ORAL | Status: DC | PRN

## 2023-07-31 SURGICAL SUPPLY — 59 items
BAG COUNTER SPONGE SURGICOUNT (BAG) IMPLANT
CAUTERY HOOK MNPLR 1.6 DVNC XI (INSTRUMENTS) ×1 IMPLANT
CHLORAPREP W/TINT 26 (MISCELLANEOUS) ×1 IMPLANT
CLIP LIGATING HEM O LOK PURPLE (MISCELLANEOUS) ×1 IMPLANT
CLIP LIGATING HEMO O LOK GREEN (MISCELLANEOUS) ×1 IMPLANT
COVER SURGICAL LIGHT HANDLE (MISCELLANEOUS) ×1 IMPLANT
COVER TIP SHEARS 8 DVNC (MISCELLANEOUS) ×1 IMPLANT
DERMABOND ADVANCED .7 DNX12 (GAUZE/BANDAGES/DRESSINGS) ×1 IMPLANT
DRAIN CHANNEL 15F RND FF 3/16 (WOUND CARE) IMPLANT
DRAPE ARM DVNC X/XI (DISPOSABLE) ×4 IMPLANT
DRAPE COLUMN DVNC XI (DISPOSABLE) ×1 IMPLANT
DRAPE INCISE IOBAN 66X45 STRL (DRAPES) ×1 IMPLANT
DRAPE SHEET LG 3/4 BI-LAMINATE (DRAPES) ×1 IMPLANT
DRIVER NDL LRG 8 DVNC XI (INSTRUMENTS) ×2 IMPLANT
DRIVER NDLE LRG 8 DVNC XI (INSTRUMENTS) ×2 IMPLANT
DRSG TEGADERM 4X4.75 (GAUZE/BANDAGES/DRESSINGS) IMPLANT
ELECT PENCIL ROCKER SW 15FT (MISCELLANEOUS) ×1 IMPLANT
ELECT REM PT RETURN 15FT ADLT (MISCELLANEOUS) ×1 IMPLANT
EVACUATOR SILICONE 100CC (DRAIN) IMPLANT
FORCEPS PROGRASP DVNC XI (FORCEP) ×2 IMPLANT
GAUZE SPONGE 2X2 8PLY STRL LF (GAUZE/BANDAGES/DRESSINGS) ×1 IMPLANT
GLOVE BIO SURGEON STRL SZ 6.5 (GLOVE) ×1 IMPLANT
GLOVE SURG LX STRL 7.5 STRW (GLOVE) ×2 IMPLANT
GOWN STRL REUS W/ TWL LRG LVL3 (GOWN DISPOSABLE) ×1 IMPLANT
GOWN STRL REUS W/ TWL XL LVL3 (GOWN DISPOSABLE) ×2 IMPLANT
GUIDEWIRE STR DUAL SENSOR (WIRE) IMPLANT
GUIDEWIRE ZIPWRE .038 STRAIGHT (WIRE) IMPLANT
HEMOSTAT SURGICEL 4X8 (HEMOSTASIS) IMPLANT
IRRIG SUCT STRYKERFLOW 2 WTIP (MISCELLANEOUS) ×1
IRRIGATION SUCT STRKRFLW 2 WTP (MISCELLANEOUS) ×1 IMPLANT
KIT BASIN OR (CUSTOM PROCEDURE TRAY) ×1 IMPLANT
KIT TURNOVER KIT A (KITS) IMPLANT
LOOP VESSEL MAXI BLUE (MISCELLANEOUS) IMPLANT
NDL INSUFFLATION 14GA 120MM (NEEDLE) ×1 IMPLANT
NEEDLE INSUFFLATION 14GA 120MM (NEEDLE) ×1 IMPLANT
PACK CYSTO (CUSTOM PROCEDURE TRAY) IMPLANT
PROTECTOR NERVE ULNAR (MISCELLANEOUS) ×2 IMPLANT
SCISSORS LAP 5X45 EPIX DISP (ENDOMECHANICALS) ×1 IMPLANT
SCISSORS MNPLR CVD DVNC XI (INSTRUMENTS) ×1 IMPLANT
SEAL UNIV 5-12 XI (MISCELLANEOUS) ×3 IMPLANT
SET TUBE SMOKE EVAC HIGH FLOW (TUBING) ×1 IMPLANT
SOL ELECTROSURG ANTI STICK (MISCELLANEOUS) ×1
SOLUTION ELECTROSURG ANTI STCK (MISCELLANEOUS) ×1 IMPLANT
SPIKE FLUID TRANSFER (MISCELLANEOUS) ×1 IMPLANT
STENT URET 6FRX24 CONTOUR (STENTS) IMPLANT
SUT ETHILON 3 0 PS 1 (SUTURE) IMPLANT
SUT MNCRL AB 4-0 PS2 18 (SUTURE) ×2 IMPLANT
SUT VIC AB 0 CT1 27XBRD ANTBC (SUTURE) IMPLANT
SUT VIC AB 0 UR5 27 (SUTURE) IMPLANT
SUT VIC AB 4-0 RB1 27XBRD (SUTURE) ×2 IMPLANT
SUT VICRYL 0 UR6 27IN ABS (SUTURE) ×1 IMPLANT
SUT VLOC BARB 180 ABS3/0GR12 (SUTURE)
SUTURE VLOC BRB 180 ABS3/0GR12 (SUTURE) IMPLANT
SYR BULB IRRIG 60ML STRL (SYRINGE) IMPLANT
TOWEL OR 17X26 10 PK STRL BLUE (TOWEL DISPOSABLE) ×1 IMPLANT
TRAY FOLEY MTR SLVR 16FR STAT (SET/KITS/TRAYS/PACK) ×1 IMPLANT
TRAY LAPAROSCOPIC (CUSTOM PROCEDURE TRAY) ×1 IMPLANT
TROCAR ADV FIXATION 12X100MM (TROCAR) ×1 IMPLANT
WATER STERILE IRR 1000ML POUR (IV SOLUTION) ×1 IMPLANT

## 2023-07-31 NOTE — Transfer of Care (Signed)
 Immediate Anesthesia Transfer of Care Note  Patient: Betty Hayes  Procedure(s) Performed: XI ROBOTIC ASSISTED RIGHT PYELOPLASTY (Right)  Patient Location: PACU  Anesthesia Type:General  Level of Consciousness: drowsy  Airway & Oxygen Therapy: Patient Spontanous Breathing and Patient connected to face mask oxygen  Post-op Assessment: Report given to RN and Post -op Vital signs reviewed and stable  Post vital signs: Reviewed and stable  Last Vitals:  Vitals Value Taken Time  BP 135/81 07/31/23 1750  Temp    Pulse 78 07/31/23 1753  Resp 15 07/31/23 1753  SpO2 100 % 07/31/23 1753  Vitals shown include unfiled device data.  Last Pain:  Vitals:   07/31/23 1019  TempSrc: Oral  PainSc:          Complications: No notable events documented.

## 2023-07-31 NOTE — Anesthesia Preprocedure Evaluation (Signed)
 Anesthesia Evaluation  Patient identified by MRN, date of birth, ID band Patient awake    Reviewed: Allergy & Precautions, NPO status , Patient's Chart, lab work & pertinent test results  History of Anesthesia Complications (+) PONV and history of anesthetic complications  Airway Mallampati: III  TM Distance: >3 FB Neck ROM: Full    Dental  (+) Teeth Intact, Dental Advisory Given   Pulmonary asthma    Pulmonary exam normal breath sounds clear to auscultation       Cardiovascular hypertension, Pt. on medications Normal cardiovascular exam Rhythm:Regular Rate:Normal     Neuro/Psych  PSYCHIATRIC DISORDERS Anxiety     negative neurological ROS     GI/Hepatic Neg liver ROS,GERD  Medicated,,low-grade follicular lymphoma of duodenum   Endo/Other  diabetes, Type 2, Oral Hypoglycemic AgentsHypothyroidism  Obesity   Renal/GU Renal disease (RIGHT URETEROPELVIC JUNCTION OBSTRUCTION)     Musculoskeletal negative musculoskeletal ROS (+)    Abdominal   Peds  Hematology negative hematology ROS (+)   Anesthesia Other Findings Day of surgery medications reviewed with the patient.  Reproductive/Obstetrics                             Anesthesia Physical Anesthesia Plan  ASA: 2  Anesthesia Plan: General   Post-op Pain Management: Tylenol  PO (pre-op)* and Toradol  IV (intra-op)*   Induction: Intravenous  PONV Risk Score and Plan: 4 or greater and Scopolamine  patch - Pre-op, Midazolam , TIVA, Dexamethasone  and Ondansetron   Airway Management Planned: Oral ETT  Additional Equipment:   Intra-op Plan:   Post-operative Plan: Extubation in OR  Informed Consent: I have reviewed the patients History and Physical, chart, labs and discussed the procedure including the risks, benefits and alternatives for the proposed anesthesia with the patient or authorized representative who has indicated his/her  understanding and acceptance.     Dental advisory given  Plan Discussed with: CRNA  Anesthesia Plan Comments: (2nd PIV for propofol  infusion)       Anesthesia Quick Evaluation

## 2023-07-31 NOTE — Op Note (Signed)
 Operative Note  Preoperative diagnosis:  1.  Right UPJ obstruction  Postoperative diagnosis: 1.  Right UPJ obstruction  Procedure(s): 1.  Robot-assisted laparoscopic right pyeloplasty with antegrade ureteral stent placement  Surgeon: Lonni Han, MD  Assistants:  Martina Holding, MD PGY-4  Anesthesia:  General  Complications:  None  EBL: 50 mL  Specimens: 1.  None  Drains/Catheters: 1.  Right 6 French, 24 cm JJ stent 2.  Right lower quadrant JP drain 3.  16 French Foley catheter with 10 mL of sterile water  in the balloon  Intraoperative findings:   Complex right renal arterial supply with lower pole crossing vessel Dismembered UPJ repair was watertight  Indication:  Betty Hayes is a 38 y.o. female with a right sided UPJ obstruction secondary to a lower pole crossing vessel.  She has been consented for the above procedures, voices understanding and wishes to proceed  Description of procedure:  After informed consent was signed, the patient was taken back to the operating room and properly anesthetized.  The patient was then placed in the left lateral decubitus position with all pressure points padded.  The abdomen was then prepped and draped in the usual sterile fashion.  A time-out was then performed.     An 8 mm incision was then made lateral to the right rectus muscle at the level of the right 12th rib.  A Veress needle was then used to access the abdominal cavity.  A saline drop test showed no signs of obstruction and aspiration of the Veress needle revealed no blood or sucus.  The abdominal cavity was then insufflated to 15 mmHg.  An 8 mm robotic trocar was then atraumatically inserted into the abdominal cavity.  The robotic camera was then inserted through the port and inspection of the abdominal cavity revealed no evidence of adjacent organ or vessel injury.  We then placed three additional 8 mm robotic ports, a 15 mm assistant port and a 5 mm subxiphoid  port in such as fashion as to triangulate the right renal hilum.  The robot was then docked into postion.   The white line of Toldt along the ascending colon was then incised, allowing us  to reflect the colon medially and expose the anterior surface of the right kidney.  The duodenum was then Kocherized medially, which abruptly led us  to identification of the inferior vena cava.    Once the colon was adequately mobilized, we moved to the lower pole and identified the gonadal vein and ureter.  The gonadal vein was then left running parallel to the vena cava and the right ureter was reflected anteriorly.  The ureter was then traced cephalad to the diffusely dilated UPJ, where the lower pole accessory renal vessel was identified, mobilized and reflected off of the UPJ.   I then incised the medial portion of the UPJ and then spatulated the lateral aspects of the ureter approximately 2 cm.  I placed 4-0 Vicryl sutures at the apex and posterior lateral aspects of the spatulated ureter.  I then completely transected the ureter from the UPJ and brought the two pieces anterior to the crossing lower pole vessel.  The medial aspect of the UPJ was spatulated approximately 2 cm.  The posterior anastomosis was completed via interrupted 4-0 Vicryl, a zip wire was then advanced, in an antegrade, fashion down the ureter. A 6 French, 24 cm stent was then placed over the wire and into position within the ureter and advanced into the renal pelvis, under direct  vision.  The anastomosis was then completed using 4-0 Vicryl suture in an interrupted fashion.  The anastomosis appeared watertight upon completion.   A JP drain was then placed through her right lower quadrant incision and into position within the lower abdomen.  The abdomen was then desufflated and all ports were removed.  The assistant port was then closed using an interrupted 0 Vicryl suture.  The skin was then reapproximated using 4-0 Monocryl and dressed with  Dermabond.  The patient was then placed in the supine position and a fluoroscopic image was obtained, confirming good stent placement within the right renal pelvis as well as the bladder.  The patient was then awoken from anesthesia having tolerated the procedure well.  She was transferred to the postanesthesia unit in stable condition.   Plan:  Monitor on the floor

## 2023-07-31 NOTE — H&P (Signed)
 Office Visit Report     07/24/2023   --------------------------------------------------------------------------------   Betty Hayes  MRN: [id number]  DOB: 1986-06-08, 38 year old Female  SSN:  REFERRING:  Sherwood BIRCH. Carolee MOULD,   PROVIDER:  Sherwood Carolee, III, M.D.  TREATING:  Ubaldo Eagles, NP  LOCATION:  [facility] [phone]     --------------------------------------------------------------------------------   CC/HPI: CC: Right hydronephrosis  HPI:  05/13/2023  38 year old female with a history of small bowel follicular lymphoma underwent CT scan of the abdomen and pelvis without contrast that revealed mild right hydronephrosis and a prominent right renal collecting system with normal caliber ureter raising the possibility of right UPJ obstruction. Last creatinine 0.73 on 03/31/2023.   06/01/2023: The patient is here today for routine follow-up. Above history noted. CT abdomen/pelvis with contrast from 05-23-23 showed stable, chronic right sided pelviectasis within normal caliber ureter suggestive of a chronic right UPJ obstruction. No other abnormalities were seen. Lasix  renogram from 05/29/2023 showed a differential function of 53% on the left and 47% on the right. T1 half post Lasix  was 7.9 minutes on the left and 9.7 minutes on the right. She reports dull right flank pain on/off since April with no other associated sxs. She notes her pain is worse when she drinks alcohol. Hx of stone episode in 2006, that required right ureteroscopy in [place] . No other GU surgeries or issues with recurrent UTIs.   06/15/2023: The patient presents back today for right ureteral stent removal after diagnostic ureteroscopy on 06/10/2023, which revealed tortuosity and redundancy of her right UPJ, but no intrinsic obstruction. She reports urinary urgency and dull right flank pain with her stent, but denies nausea/vomiting, fever/chills or any other constitutional symptoms.   07/24/2023: Here  today for preoperative appointment prior to undergoing right robotic assisted pyeloplasty with Dr. Devere on 1/10. Overall doing well. About a week ago she had some right flank pain and discomfort which also correlated with reduced force of stream and urine output. The symptoms have since resolved. She is not having any dysuria or gross hematuria. Denies flank or lower back pain at present time. She tells me she has been recently diagnosed with insulin  resistance and her primary care provider has placed her on metformin  more of a preventative strategy. She denies any other changes in past medical history or prescription medications taken on a daily basis. She is not on any type of antimicrobial treatment. Patient is afebrile, denies nausea or vomiting. She has had no recent fevers or chills, chest pain or shortness of breath.     ALLERGIES: None   MEDICATIONS: Lisinopril  5 mg tablet  Metformin  Hcl  Hydroxyzine  Hcl  Liothyronine  Sodium 5 mcg tablet  Magnesium   Pantoprazole  Sodium  Synthroid   Vitamin B12  Vitamin D      GU PSH: Cysto Remove Stent FB Sim - 06/15/2023     NON-GU PSH: No Non-GU PSH    GU PMH: UPJ obstruction (congenital) - 06/15/2023, - 05/22/2023, - 05/13/2023 Hydronephrosis - 06/01/2023, - 05/22/2023, - 05/13/2023    NON-GU PMH: No Non-GU PMH    FAMILY HISTORY: No Family History    SOCIAL HISTORY: Marital Status: Married    REVIEW OF SYSTEMS:    GU Review Female:   Patient denies frequent urination, hard to postpone urination, burning /pain with urination, get up at night to urinate, leakage of urine, stream starts and stops, trouble starting your stream, have to strain to urinate, and being pregnant.  Gastrointestinal (Upper):   Patient denies  nausea, vomiting, and indigestion/ heartburn.  Gastrointestinal (Lower):   Patient denies diarrhea and constipation.  Constitutional:   Patient denies fever, night sweats, weight loss, and fatigue.  Skin:   Patient denies skin  rash/ lesion and itching.  Eyes:   Patient denies blurred vision and double vision.  Ears/ Nose/ Throat:   Patient denies sore throat and sinus problems.  Hematologic/Lymphatic:   Patient denies swollen glands and easy bruising.  Cardiovascular:   Patient denies leg swelling and chest pains.  Respiratory:   Patient denies cough and shortness of breath.  Endocrine:   Patient denies excessive thirst.  Musculoskeletal:   Patient denies back pain and joint pain.  Neurological:   Patient denies dizziness and headaches.  Psychologic:   Patient denies depression and anxiety.   VITAL SIGNS:      07/24/2023 02:02 PM  Weight 218 lb / 98.88 kg  Height 67 in / 170.18 cm  BP 125/84 mmHg  Pulse 85 /min  Temperature 96.8 F / 36 C  BMI 34.1 kg/m   MULTI-SYSTEM PHYSICAL EXAMINATION:    Constitutional: Well-nourished. No physical deformities. Normally developed. Good grooming.  Neck: Neck symmetrical, not swollen. Normal tracheal position.  Respiratory: No labored breathing, no use of accessory muscles.   Cardiovascular: Normal temperature, normal extremity pulses, no swelling, no varicosities.  Skin: No paleness, no jaundice, no cyanosis. No lesion, no ulcer, no rash.  Neurologic / Psychiatric: Oriented to time, oriented to place, oriented to person. No depression, no anxiety, no agitation.  Gastrointestinal: No mass, no tenderness, no rigidity, non obese abdomen.  Musculoskeletal: Normal gait and station of head and neck.     Complexity of Data:  Source Of History:  Patient, Medical Record Summary  Records Review:   Previous Doctor Records, Previous Hospital Records, Previous Patient Records  Urine Test Review:   Urinalysis, Urine Culture  X-Ray Review: C.T. Abdomen/Pelvis: Reviewed Films. Reviewed Report.  Lasix  Renogram: Reviewed Report.     07/24/23  Urinalysis  Urine Appearance Clear   Urine Color Yellow   Urine Glucose Neg mg/dL  Urine Bilirubin Neg mg/dL  Urine Ketones Neg mg/dL   Urine Specific Gravity 1.010   Urine Blood 1+ ery/uL  Urine pH 7.5   Urine Protein Trace mg/dL  Urine Urobilinogen 0.2 mg/dL  Urine Nitrites Neg   Urine Leukocyte Esterase Neg leu/uL  Urine WBC/hpf NS (Not Seen)   Urine RBC/hpf 3 - 10/hpf   Urine Epithelial Cells 6 - 10/hpf   Urine Bacteria NS (Not Seen)   Urine Mucous Present   Urine Yeast NS (Not Seen)   Urine Trichomonas Not Present   Urine Cystals NS (Not Seen)   Urine Casts NS (Not Seen)   Urine Sperm Not Present    PROCEDURES:          Urinalysis w/Scope Dipstick Dipstick Cont'd Micro  Color: Yellow Bilirubin: Neg mg/dL WBC/hpf: NS (Not Seen)  Appearance: Clear Ketones: Neg mg/dL RBC/hpf: 3 - 89/yeq  Specific Gravity: 1.010 Blood: 1+ ery/uL Bacteria: NS (Not Seen)  pH: 7.5 Protein: Trace mg/dL Cystals: NS (Not Seen)  Glucose: Neg mg/dL Urobilinogen: 0.2 mg/dL Casts: NS (Not Seen)    Nitrites: Neg Trichomonas: Not Present    Leukocyte Esterase: Neg leu/uL Mucous: Present      Epithelial Cells: 6 - 10/hpf      Yeast: NS (Not Seen)      Sperm: Not Present    ASSESSMENT:      ICD-10 Details  1 GU:  Hydronephrosis - N13.0 Right, Chronic, Stable  2   UPJ obstruction (congenital) - Q62.11 Right, Chronic, Threat to Bodily Function  3 NON-GU:   Encounter for other preprocedural examination - Z01.818 Undiagnosed New Problem   PLAN:            Medications Stop Meds: Macrobid 100 mg capsule 1 capsule PO As Directed Take one capsule immediately following intercourse Start: 06/01/2023  Stop: 01/27/2024   Discontinue: 07/24/2023  - Reason: The medication cycle was completed.            Orders Labs Urine Culture          Schedule Return Visit/Planned Activity: Keep Scheduled Appointment - Follow up MD, Schedule Surgery          Document Letter(s):  Created for Patient: Clinical Summary         Notes:   All questions answered to the best of my ability regarding the upcoming procedure and expected postoperative  course with understanding expressed by the patient. Urine culture sent today to serve as a baseline. She will proceed with previously scheduled right robotic pyeloplasty with Dr. Devere on 1/10.   -The risks, benefits and alternatives of RIGHT robot-assisted pyeloplasty was discussed with the patient. Risks include, but are not limited to, bleeding, infection, recurrence of the UPJ obstruction, ureteral stricture disease, urine leak, chronic pain, nephrectomy, multiple surgeries to correct her UPJ obstruction and the need for an indwelling stent and in the inherent risks of general anesthesia. The patient voices understanding.

## 2023-07-31 NOTE — Anesthesia Procedure Notes (Signed)
 Procedure Name: Intubation Date/Time: 07/31/2023 2:25 PM  Performed by: Harl Armida PARAS, CRNAPre-anesthesia Checklist: Patient identified, Emergency Drugs available, Suction available, Patient being monitored and Timeout performed Patient Re-evaluated:Patient Re-evaluated prior to induction Oxygen Delivery Method: Circle system utilized Preoxygenation: Pre-oxygenation with 100% oxygen Induction Type: IV induction Ventilation: Mask ventilation without difficulty Laryngoscope Size: Mac Grade View: Grade I Tube type: Oral Tube size: 7.5 mm Number of attempts: 1 Airway Equipment and Method: Stylet Placement Confirmation: ETT inserted through vocal cords under direct vision, positive ETCO2 and breath sounds checked- equal and bilateral Secured at: 23 cm Tube secured with: Tape Dental Injury: Teeth and Oropharynx as per pre-operative assessment

## 2023-07-31 NOTE — Discharge Summary (Addendum)
 Date of admission: 07/31/2023  Date of discharge: 08/02/2023  Admission diagnosis: UPJ obstruction  Discharge diagnosis: same  Secondary diagnoses:  Patient Active Problem List   Diagnosis Date Noted   UPJ obstruction, acquired 07/31/2023   Vitamin D  deficiency 06/01/2023   Insulin  resistance 06/01/2023   BMI 35.0-35.9,adult 06/01/2023   Class 2 severe obesity with serious comorbidity and body mass index (BMI) of 35.0 to 35.9 in adult (HCC) 06/01/2023   Contact dermatitis of foot 01/06/2023   HTN, goal below 130/80 12/23/2022   Right sided abdominal pain 12/23/2022   Abdominal pain 05/24/2020   Rectal bleeding 05/24/2020   Nausea without vomiting 02/06/2020   Early satiety 02/06/2020   Diarrhea 02/06/2020   Grade I follicular lymphoma of extranodal site excluding spleen and other solid organs (HCC) 09/29/2019   Goals of care, counseling/discussion 09/29/2019   Follicular lymphoma (HCC) 07/28/2019   GERD (gastroesophageal reflux disease) 03/31/2019   Dysphagia 03/29/2019   Obesity (BMI 30.0-34.9) 12/04/2017   Hashimoto's thyroiditis 05/22/2016   Hypothyroidism 01/03/2016    Procedures performed: Procedure(s): XI ROBOTIC ASSISTED RIGHT PYELOPLASTY  History and Physical: For full details, please see admission history and physical. Briefly, Betty Hayes is a 38 y.o. year old patient with UPJ obstruction on the right side now s/p right robotic pyeloplasty with stent placement.   Hospital Course: Patient tolerated the procedure well.  She was then transferred to the floor after an uneventful PACU stay.  Her hospital course was uncomplicated.  On POD#2 she had met discharge criteria: was eating a regular diet, was up and ambulating independently,  pain was well controlled, was voiding without a catheter, and was ready to for discharge. Patient had flatus and was tolerating regular diet.   Laboratory values:  No results for input(s): WBC, HGB, HCT in the last 72 hours. No  results for input(s): NA, K, CL, CO2, GLUCOSE, BUN, CREATININE, CALCIUM in the last 72 hours. No results for input(s): LABPT, INR in the last 72 hours. No results for input(s): LABURIN in the last 72 hours. Results for orders placed or performed in visit on 03/06/23  Rapid Strep Screen ([facility] ONLY)     Status: None   Collection Time: 03/06/23  9:17 AM   Specimen: Other   Other  Result Value Ref Range Status   Strep Gp A Ag, IA W/Reflex Negative Negative Final  Culture, Group A Strep     Status: None   Collection Time: 03/06/23  9:17 AM   Other  Result Value Ref Range Status   Strep A Culture CANCELED      Comment: Test not performed  Result canceled by the ancillary.   Novel Coronavirus, NAA ([facility])     Status: None   Collection Time: 03/06/23  9:19 AM   Specimen: Nasopharyngeal(NP) swabs in vial transport medium  Result Value Ref Range Status   SARS-CoV-2, NAA Not Detected Not Detected Final    Comment: This nucleic acid amplification test was developed and its performance characteristics determined by [employer]. Nucleic acid amplification tests include RT-PCR and TMA. This test has not been FDA cleared or approved. This test has been authorized by FDA under an Emergency Use Authorization (EUA). This test is only authorized for the duration of time the declaration that circumstances exist justifying the authorization of the emergency use of in vitro diagnostic tests for detection of SARS-CoV-2 virus and/or diagnosis of COVID-19 infection under section 564(b)(1) of the Act, 21 U.S.C. 639aaa-6(a) (1), unless the  authorization is terminated or revoked sooner. When diagnostic testing is negative, the possibility of a false negative result should be considered in the context of a patient's recent exposures and the presence of clinical signs and symptoms consistent with COVID-19. An individual without symptoms of COVID-19 and who is not  shedding SARS-CoV-2 virus wo uld expect to have a negative (not detected) result in this assay.     Physical Exam  Gen: NAD Resp: Satting well on RA Card: Regular rate Abd: Soft, appropriately tender, ND, incision clean dry and intact GU: Voing spontaneously  Neuro: Alert   Disposition: Home  Discharge instruction: The patient was instructed to be ambulatory but told to refrain from heavy lifting, strenuous activity, or driving.   Discharge medications:   Oxybutynin  5mg  TID PRN bladder spasms Oxycodone  5mg  PRN pain  Followup:   Follow-up Information     [facility] Follow up on 08/14/2023.   Why: Postop appointment at 12:45 Contact information: [address] [place]  [phone]

## 2023-08-01 DIAGNOSIS — Z79899 Other long term (current) drug therapy: Secondary | ICD-10-CM | POA: Diagnosis not present

## 2023-08-01 DIAGNOSIS — N130 Hydronephrosis with ureteropelvic junction obstruction: Secondary | ICD-10-CM | POA: Diagnosis not present

## 2023-08-01 DIAGNOSIS — I10 Essential (primary) hypertension: Secondary | ICD-10-CM | POA: Diagnosis not present

## 2023-08-01 DIAGNOSIS — E039 Hypothyroidism, unspecified: Secondary | ICD-10-CM | POA: Diagnosis not present

## 2023-08-01 DIAGNOSIS — Z7984 Long term (current) use of oral hypoglycemic drugs: Secondary | ICD-10-CM | POA: Diagnosis not present

## 2023-08-01 LAB — BASIC METABOLIC PANEL
Anion gap: 9 (ref 5–15)
BUN: 10 mg/dL (ref 6–20)
CO2: 19 mmol/L — ABNORMAL LOW (ref 22–32)
Calcium: 8.5 mg/dL — ABNORMAL LOW (ref 8.9–10.3)
Chloride: 109 mmol/L (ref 98–111)
Creatinine, Ser: 0.6 mg/dL (ref 0.44–1.00)
GFR, Estimated: >60 mL/min (ref >60)
Glucose, Bld: 101 mg/dL — ABNORMAL HIGH (ref 70–99)
Potassium: 4.1 mmol/L (ref 3.5–5.1)
Sodium: 137 mmol/L (ref 135–145)

## 2023-08-01 LAB — GLUCOSE, CAPILLARY
Glucose-Capillary: 102 mg/dL — ABNORMAL HIGH (ref 70–99)
Glucose-Capillary: 136 mg/dL — ABNORMAL HIGH (ref 70–99)
Glucose-Capillary: 107 mg/dL — ABNORMAL HIGH (ref 70–99)
Glucose-Capillary: 84 mg/dL (ref 70–99)

## 2023-08-01 LAB — HEMOGLOBIN AND HEMATOCRIT, BLOOD
HCT: 38.2 % (ref 36.0–46.0)
Hemoglobin: 12.3 g/dL (ref 12.0–15.0)

## 2023-08-01 MED ORDER — OXYBUTYNIN CHLORIDE 5 MG PO TABS
5.0000 mg | ORAL_TABLET | Freq: Three times a day (TID) | ORAL | Status: DC | PRN
  Administered 2023-08-01 – 2023-08-02 (×5): 5 mg via ORAL
  Filled 2023-08-01 (×5): qty 1

## 2023-08-01 MED ORDER — CHLORHEXIDINE GLUCONATE CLOTH 2 % EX PADS
6.0000 | MEDICATED_PAD | Freq: Every day | CUTANEOUS | Status: DC
  Administered 2023-08-01: 6 via TOPICAL

## 2023-08-01 NOTE — Plan of Care (Signed)

## 2023-08-01 NOTE — Progress Notes (Signed)
 Patient was able to void. Reported some pain with voiding, urine is a light orange/yellow color.

## 2023-08-01 NOTE — Progress Notes (Signed)
 Urology Inpatient Progress Report  UPJ obstruction, acquired [N13.5]  Procedure(s): XI ROBOTIC ASSISTED RIGHT PYELOPLASTY  1 Day Post-Op   Intv/Subj: No acute events overnight. Patient hungry and wants real food.  +OOB.  No nausea/vomiting.  Principal Problem:   UPJ obstruction, acquired  Current Facility-Administered Medications  Medication Dose Route Frequency Provider Last Rate Last Admin   0.9 %  sodium chloride  infusion   Intravenous Continuous Matthew-Onabanjo, Asia, MD 75 mL/hr at 07/31/23 2047 New Bag at 07/31/23 2047   acetaminophen  (OFIRMEV ) 10 MG/ML IV            acetaminophen  (OFIRMEV ) IV 1,000 mg  1,000 mg Intravenous Q8H Matthew-Onabanjo, Asia, MD 400 mL/hr at 08/01/23 0552 1,000 mg at 08/01/23 9447   amisulpride  (BARHEMSYS ) 5 MG/2ML injection            docusate sodium  (COLACE) capsule 100 mg  100 mg Oral BID Matthew-Onabanjo, Asia, MD   100 mg at 07/31/23 2050   HYDROmorphone  (DILAUDID ) injection 0.5-1 mg  0.5-1 mg Intravenous Q2H PRN Matthew-Onabanjo, Asia, MD   1 mg at 08/01/23 0446   insulin  aspart (novoLOG ) injection 0-20 Units  0-20 Units Subcutaneous TID WC Matthew-Onabanjo, Asia, MD       levothyroxine  (SYNTHROID ) tablet 88 mcg  88 mcg Oral QAC breakfast Matthew-Onabanjo, Asia, MD   88 mcg at 08/01/23 0549   liothyronine  (CYTOMEL ) tablet 5 mcg  5 mcg Oral QAC breakfast Matthew-Onabanjo, Asia, MD       ondansetron  (ZOFRAN ) injection 4 mg  4 mg Intravenous Q4H PRN Matthew-Onabanjo, Asia, MD       oxybutynin  (DITROPAN ) tablet 5 mg  5 mg Oral Q8H PRN Lekia Nier D, MD   5 mg at 08/01/23 0057   oxyCODONE  (Oxy IR/ROXICODONE ) immediate release tablet 5 mg  5 mg Oral Q4H PRN Matthew-Onabanjo, Asia, MD   5 mg at 08/01/23 0056   pantoprazole  (PROTONIX ) EC tablet 40 mg  40 mg Oral Daily PRN Matthew-Onabanjo, Asia, MD       scopolamine  (TRANSDERM-SCOP) 1 MG/3DAYS 1.5 mg  1 patch Transdermal Once Corinne Garnette BRAVO, MD   1.5 mg at 07/31/23 1247      Objective: Vital: Vitals:   07/31/23 1915 07/31/23 1930 07/31/23 2007 08/01/23 0455  BP: 115/71 107/64 (!) 138/90 131/66  Pulse: (!) 57 63 65 71  Resp: 10 11 20 12   Temp:  98.7 F (37.1 C) (!) 97.5 F (36.4 C) 98.9 F (37.2 C)  TempSrc:   Oral Oral  SpO2: 95% 95% 97% 97%  Weight:      Height:       I/Os: I/O last 3 completed shifts: In: 600 [I.V.:500; IV Piggyback:100] Out: 50 [Blood:50]  Physical Exam:  General: Patient is in no apparent distress Lungs: Normal respiratory effort, chest expands symmetrically. GI: Incisions are c/d/i.  The abdomen is soft and appropriately tender without mass. JP drain with serosanguinous drainage (10 mL overnight) Foley: clear yellow urine  Ext: lower extremities symmetric  Lab Results: Recent Labs    07/31/23 2118  HGB 13.0  HCT 38.3   Recent Labs    07/31/23 2118  NA 135  K 4.3  CL 107  CO2 20*  GLUCOSE 127*  BUN 12  CREATININE 0.55  CALCIUM 8.6*   No results for input(s): LABPT, INR in the last 72 hours. No results for input(s): LABURIN in the last 72 hours. Results for orders placed or performed in visit on 03/06/23  Rapid Strep Screen ([facility] ONLY)  Status: None   Collection Time: 03/06/23  9:17 AM   Specimen: Other   Other  Result Value Ref Range Status   Strep Gp A Ag, IA W/Reflex Negative Negative Final  Culture, Group A Strep     Status: None   Collection Time: 03/06/23  9:17 AM   Other  Result Value Ref Range Status   Strep A Culture CANCELED      Comment: Test not performed  Result canceled by the ancillary.   Novel Coronavirus, NAA ([facility])     Status: None   Collection Time: 03/06/23  9:19 AM   Specimen: Nasopharyngeal(NP) swabs in vial transport medium  Result Value Ref Range Status   SARS-CoV-2, NAA Not Detected Not Detected Final    Comment: This nucleic acid amplification test was developed and its performance characteristics determined by [employer]. Nucleic  acid amplification tests include RT-PCR and TMA. This test has not been FDA cleared or approved. This test has been authorized by FDA under an Emergency Use Authorization (EUA). This test is only authorized for the duration of time the declaration that circumstances exist justifying the authorization of the emergency use of in vitro diagnostic tests for detection of SARS-CoV-2 virus and/or diagnosis of COVID-19 infection under section 564(b)(1) of the Act, 21 U.S.C. 639aaa-6(a) (1), unless the authorization is terminated or revoked sooner. When diagnostic testing is negative, the possibility of a false negative result should be considered in the context of a patient's recent exposures and the presence of clinical signs and symptoms consistent with COVID-19. An individual without symptoms of COVID-19 and who is not shedding SARS-CoV-2 virus wo uld expect to have a negative (not detected) result in this assay.     Studies/Results: DG Abd 1 View Result Date: 07/31/2023 CLINICAL DATA:  [id number] Ureteral stent present [id number]. Confirm RIGHT stent placement EXAM: ABDOMEN - 1 VIEW COMPARISON:  CT 04/16/2023. FINDINGS: Double-J RIGHT ureteral stent noted in expected location. Surgical drain in the RIGHT lower quadrant. IMPRESSION: RIGHT double-J ureteral stent appears in proper position by plain film radiography. Electronically Signed   By: Jackquline Boxer M.D.   On: 07/31/2023 18:14    Assessment: Right UPJ obstruction POD1 s/p right robotic pyeloplasty  Plan: -advance diet as tolerated -+ambulate -dc foley -pain meds and home meds -SCDs -d/c JP later today or tomorrow   Valli Shank, MD Urology 08/01/2023, 5:55 AM

## 2023-08-01 NOTE — Progress Notes (Signed)
 Mobility Specialist - Progress Note   08/01/23 0924  Mobility  Activity Ambulated with assistance in hallway  Level of Assistance Contact guard assist, steadying assist  Assistive Device Other (Comment) (HHA)  Distance Ambulated (ft) 300 ft  Range of Motion/Exercises Active  Activity Response Tolerated well  Mobility Referral Yes  Mobility visit 1 Mobility  Mobility Specialist Start Time (ACUTE ONLY) 0910  Mobility Specialist Stop Time (ACUTE ONLY) [id number]  Mobility Specialist Time Calculation (min) (ACUTE ONLY) 14 min   Pt was found in bed and agreeable to ambulate. C/o abdominal pain and spasm during session. At EOS returned to bed with all needs met. Call bell in reach and husband in room.  Erminio Leos Mobility Specialist

## 2023-08-02 DIAGNOSIS — Z7984 Long term (current) use of oral hypoglycemic drugs: Secondary | ICD-10-CM | POA: Diagnosis not present

## 2023-08-02 DIAGNOSIS — I10 Essential (primary) hypertension: Secondary | ICD-10-CM | POA: Diagnosis not present

## 2023-08-02 DIAGNOSIS — Z79899 Other long term (current) drug therapy: Secondary | ICD-10-CM | POA: Diagnosis not present

## 2023-08-02 DIAGNOSIS — E039 Hypothyroidism, unspecified: Secondary | ICD-10-CM | POA: Diagnosis not present

## 2023-08-02 DIAGNOSIS — N130 Hydronephrosis with ureteropelvic junction obstruction: Secondary | ICD-10-CM | POA: Diagnosis not present

## 2023-08-02 LAB — GLUCOSE, CAPILLARY: Glucose-Capillary: 82 mg/dL (ref 70–99)

## 2023-08-02 MED ORDER — OXYBUTYNIN CHLORIDE 5 MG PO TABS: 5.0000 mg | ORAL_TABLET | Freq: Three times a day (TID) | ORAL | 1 refills | Status: DC | PRN

## 2023-08-02 MED ORDER — OXYCODONE HCL 5 MG PO TABS: 5.0000 mg | ORAL_TABLET | Freq: Three times a day (TID) | ORAL | 0 refills | Status: DC | PRN

## 2023-08-02 NOTE — Plan of Care (Signed)

## 2023-08-02 NOTE — TOC Progression Note (Addendum)
 Transition of Care [facility]) - Progression Note    Patient Details  Name: Betty Hayes MRN: [id number] Date of Birth: [birth year]  [facility] CM/SW Contact  Mitzie LOISE [place], [place] Phone Number: 08/02/2023, 10:51 AM  Clinical Narrative:     Patient will be going home with family supports. No identified needs at this time. Patient is at baseline.   TOC signing off please reconsult with any other TOC needs.        Expected Discharge Plan and Services    Patient will be returning home.      Expected Discharge Date: 08/02/23                                     Social Determinants of Health (SDOH) Interventions SDOH Screenings   Food Insecurity: Patient Declined (07/31/2023)  Housing: Patient Declined (07/31/2023)  Transportation Needs: No Transportation Needs (07/31/2023)  Utilities: Not At Risk (07/31/2023)  Alcohol Screen: Low Risk  (05/28/2020)  Depression (PHQ2-9): Medium Risk (03/06/2023)  Financial Resource Strain: Patient Declined (03/06/2023)  Physical Activity: Insufficiently Active (03/06/2023)  Social Connections: Unknown (03/06/2023)  Stress: Patient Declined (03/06/2023)  Tobacco Use: Low Risk  (07/31/2023)    Readmission Risk Interventions     No data to display

## 2023-08-02 NOTE — Discharge Instructions (Signed)
  Driving:  It is against the law to drive when taking narcotic pain medications.  You should wait at least 8 hours after taking your last pain pill before driving.  Further, you should not drive if you are to sore to react quickly or if you have something impeding your ability to drive.   Activity:  You are encouraged to ambulate frequently (about every hour during waking hours) to help prevent blood clots from forming in your legs or lungs.  However, you should not engage in any heavy lifting (> 10-15 lbs), strenuous activity, or straining.  Diet: You should advance your diet as instructed by your physician.  It will be normal to have some bloating, nausea, and abdominal discomfort intermittently.  Prescriptions:  You will be provided a prescription for pain medication to take as needed.  If your pain is not severe enough to require the prescription pain medication, you may take extra strength Tylenol  instead which will have less side effects.  You should also take a prescribed stool softener to avoid straining with bowel movements as the prescription pain medication may constipate you.  Incisions: You may remove your dressing bandages 48 hours after surgery if not removed in the hospital.  You will either have some small staples or special tissue glue at each of the incision sites. Once the bandages are removed (if present), the incisions may stay open to air.  You may start showering (but not soaking or bathing in water) the 2nd day after surgery and the incisions simply need to be patted dry after the shower.  No additional care is needed.  What to call us  about: You should call the office [phone]) if you develop fever > 101 or develop persistent vomiting. Activity:  You are encouraged to ambulate frequently (about every hour during waking hours) to help prevent blood clots from forming in your legs or lungs.  However, you should not engage in any heavy lifting (> 10-15 lbs), strenuous activity,  or straining.

## 2023-08-02 NOTE — Anesthesia Postprocedure Evaluation (Signed)
 Anesthesia Post Note  Patient: Betty Hayes  Procedure(s) Performed: XI ROBOTIC ASSISTED RIGHT PYELOPLASTY (Right)     Patient location during evaluation: PACU Anesthesia Type: General Level of consciousness: awake and alert Pain management: pain level controlled Vital Signs Assessment: post-procedure vital signs reviewed and stable Respiratory status: spontaneous breathing, nonlabored ventilation, respiratory function stable and patient connected to nasal cannula oxygen Cardiovascular status: blood pressure returned to baseline and stable Postop Assessment: no apparent nausea or vomiting Anesthetic complications: no   No notable events documented.  Last Vitals:  Vitals:   08/01/23 2127 08/02/23 0527  BP: 116/83 117/67  Pulse: 78 70  Resp: 20 18  Temp: 36.5 C 36.8 C  SpO2: 96% 98%    Last Pain:  Vitals:   08/02/23 1009  TempSrc:   PainSc: 2                  Epifanio Lamar BRAVO

## 2023-08-03 ENCOUNTER — Encounter (HOSPITAL_COMMUNITY): Payer: Self-pay | Admitting: Urology

## 2023-08-10 ENCOUNTER — Inpatient Hospital Stay: Payer: BC Managed Care – PPO | Attending: Hematology

## 2023-08-10 DIAGNOSIS — E063 Autoimmune thyroiditis: Secondary | ICD-10-CM | POA: Diagnosis not present

## 2023-08-10 DIAGNOSIS — Z08 Encounter for follow-up examination after completed treatment for malignant neoplasm: Secondary | ICD-10-CM | POA: Diagnosis not present

## 2023-08-10 DIAGNOSIS — Z8572 Personal history of non-Hodgkin lymphomas: Secondary | ICD-10-CM | POA: Insufficient documentation

## 2023-08-10 DIAGNOSIS — Z79890 Hormone replacement therapy (postmenopausal): Secondary | ICD-10-CM | POA: Insufficient documentation

## 2023-08-10 DIAGNOSIS — C8209 Follicular lymphoma grade I, extranodal and solid organ sites: Secondary | ICD-10-CM

## 2023-08-10 LAB — COMPREHENSIVE METABOLIC PANEL
ALT: 18 U/L (ref 0–44)
AST: 19 U/L (ref 15–41)
Albumin: 4 g/dL (ref 3.5–5.0)
Alkaline Phosphatase: 75 U/L (ref 38–126)
Anion gap: 12 (ref 5–15)
BUN: 9 mg/dL (ref 6–20)
CO2: 25 mmol/L (ref 22–32)
Calcium: 9.4 mg/dL (ref 8.9–10.3)
Chloride: 100 mmol/L (ref 98–111)
Creatinine, Ser: 0.65 mg/dL (ref 0.44–1.00)
GFR, Estimated: >60 mL/min (ref >60)
Glucose, Bld: 89 mg/dL (ref 70–99)
Potassium: 3.7 mmol/L (ref 3.5–5.1)
Sodium: 137 mmol/L (ref 135–145)
Total Bilirubin: 0.5 mg/dL (ref 0.0–1.2)
Total Protein: 7.8 g/dL (ref 6.5–8.1)

## 2023-08-10 LAB — CBC WITH DIFFERENTIAL/PLATELET
Abs Immature Granulocytes: 0.05 10*3/uL (ref 0.00–0.07)
Basophils Absolute: 0.1 10*3/uL (ref 0.0–0.1)
Basophils Relative: 1 %
Eosinophils Absolute: 0.4 10*3/uL (ref 0.0–0.5)
Eosinophils Relative: 4 %
HCT: 40.7 % (ref 36.0–46.0)
Hemoglobin: 13 g/dL (ref 12.0–15.0)
Immature Granulocytes: 1 %
Lymphocytes Relative: 28 %
Lymphs Abs: 2.8 10*3/uL (ref 0.7–4.0)
MCH: 26.3 pg (ref 26.0–34.0)
MCHC: 31.9 g/dL (ref 30.0–36.0)
MCV: 82.2 fL (ref 80.0–100.0)
Monocytes Absolute: 0.9 10*3/uL (ref 0.1–1.0)
Monocytes Relative: 9 %
Neutro Abs: 6 10*3/uL (ref 1.7–7.7)
Neutrophils Relative %: 57 %
Platelets: 294 10*3/uL (ref 150–400)
RBC: 4.95 MIL/uL (ref 3.87–5.11)
RDW: 12.6 % (ref 11.5–15.5)
WBC: 10.2 10*3/uL (ref 4.0–10.5)
nRBC: 0 % (ref 0.0–0.2)

## 2023-08-10 LAB — LACTATE DEHYDROGENASE: LDH: 100 U/L (ref 98–192)

## 2023-08-14 DIAGNOSIS — N130 Hydronephrosis with ureteropelvic junction obstruction: Secondary | ICD-10-CM | POA: Diagnosis not present

## 2023-08-17 ENCOUNTER — Inpatient Hospital Stay (HOSPITAL_BASED_OUTPATIENT_CLINIC_OR_DEPARTMENT_OTHER): Payer: BC Managed Care – PPO | Admitting: Hematology

## 2023-08-17 VITALS — BP 116/88 | HR 102 | Temp 98.7°F | Resp 20 | Wt 216.1 lb

## 2023-08-17 DIAGNOSIS — Z8572 Personal history of non-Hodgkin lymphomas: Secondary | ICD-10-CM | POA: Diagnosis not present

## 2023-08-17 DIAGNOSIS — Z08 Encounter for follow-up examination after completed treatment for malignant neoplasm: Secondary | ICD-10-CM | POA: Diagnosis not present

## 2023-08-17 DIAGNOSIS — Z79890 Hormone replacement therapy (postmenopausal): Secondary | ICD-10-CM | POA: Diagnosis not present

## 2023-08-17 DIAGNOSIS — E063 Autoimmune thyroiditis: Secondary | ICD-10-CM | POA: Diagnosis not present

## 2023-08-17 DIAGNOSIS — C8209 Follicular lymphoma grade I, extranodal and solid organ sites: Secondary | ICD-10-CM

## 2023-08-17 NOTE — Progress Notes (Signed)
[name] [address] [place] [address]    Clinic Day:  08/17/2023  Referring physician: Dettinger, Elige Radon, MD  Patient Care Team: Dettinger, Elige Radon, MD as PCP - General (Family Medicine) Jena Gauss Gerrit Friends, MD as Consulting Physician (Gastroenterology) Doreatha Massed, MD as Consulting Physician (Hematology)   ASSESSMENT & PLAN:   Assessment: 1.  Stage I AE duodenal follicular lymphoma: -EGD on 07/13/2019 shows patchy areas of whitish, nodular mucosa in second part of the duodenum, biopsy consistent with grade 1-2/3 follicular lymphoma. -PET scan on 07/25/2019-very mild hypermetabolic along the second portion of the duodenum, maximum SUV 3.6. -No B symptoms.  She lost some weight due to digestive problems. -Bone marrow biopsy on 08/02/2019 shows normocellular marrow with trilineage hematopoiesis. -She has acid reflux symptoms which improved mildly after starting gluten-free diet.  Continues to have nausea and diarrhea. -Weekly rituximab from 10/27/2019 through 11/18/2019. -Presentation with diarrhea, diagnosed with Cyclospora Cayetanensis on 01/27/2020, treated with Bactrim. -EGD on 05/09/2020 showed normal stomach and esophagus.  Area of abnormality in the distal second portion of the duodenum, smaller area (approximately 60% smaller than area seen 1 year ago).  Remainder of the first, second and third portion of duodenum normal. -Duodenal biopsy consistent with follicular lymphoma.   2.  Hashimoto's thyroiditis: -She is on Synthroid.  3.  Family history: -Paternal uncle had DLBCL.  Paternal grandmother had leukemia.  Paternal grandfather had colon cancer. -Maternal great aunt had breast cancer.  Maternal great grandmother had ovarian cancer.  Mother had ITP. -Maternal grandmother is newly diagnosed with large B-cell lymphoma.  Plan: 1.  Stage I AE duodenal follicular lymphoma: - She denies any fevers or weight loss.  She had night sweats couple of  times since last visit. - She underwent robotic assisted right pyeloplasty with antegrade ureteral stent placement by Dr. Sande Brothers on 07/31/2023 and is recovering from surgery. - Continues to have acid reflux and takes Protonix as needed. - Physical exam: No palpable adenopathy or splenomegaly. - Labs from 08/10/2023: Normal LFTs.  CBC normal.  LDH was normal. - No evidence of recurrence at this time.  RTC 1 year for follow-up with repeat labs.  Orders Placed This Encounter  Procedures   CBC with Differential    Standing Status:   Future    Expected Date:   02/08/2024    Expiration Date:   08/16/2024   Comprehensive metabolic panel    Standing Status:   Future    Expected Date:   02/08/2024    Expiration Date:   08/16/2024   Lactate dehydrogenase    Standing Status:   Future    Expected Date:   02/08/2024    Expiration Date:   08/16/2024      Mikeal Hawthorne R Teague,acting as a scribe for Doreatha Massed, MD.,have documented all relevant documentation on the behalf of Doreatha Massed, MD,as directed by  Doreatha Massed, MD while in the presence of Doreatha Massed, MD.   I, Doreatha Massed MD, have reviewed the above documentation for accuracy and completeness, and I agree with the above.   Doreatha Massed, MD   1/27/20253:10 PM  CHIEF COMPLAINT:   Diagnosis: Stage I duodenal follicular lymphoma   Cancer Staging  Follicular lymphoma (HCC) Staging form: Hodgkin and Non-Hodgkin Lymphoma, AJCC 8th Edition - Clinical stage from 09/29/2019: Stage IE (Follicular lymphoma) - Signed by Doreatha Massed, MD on 09/29/2019    Prior Therapy: Rituximab completed in April 2021  Current Therapy: Surveillance  HISTORY OF PRESENT ILLNESS:   Oncology History  Follicular lymphoma (HCC)  07/28/2019 Initial Diagnosis   Follicular lymphoma (HCC)   09/29/2019 Cancer Staging   Staging form: Hodgkin and Non-Hodgkin Lymphoma, AJCC 8th Edition - Clinical stage from 09/29/2019:  Stage IE (Follicular lymphoma) - Signed by Doreatha Massed, MD on 09/29/2019   Grade I follicular lymphoma of extranodal site excluding spleen and other solid organs (HCC)  09/29/2019 Initial Diagnosis   Grade I follicular lymphoma of extranodal site excluding spleen and other solid organs (HCC)   10/27/2019 - 11/18/2019 Chemotherapy   Patient is on Treatment Plan : NON-HODGKINS LYMPHOMA Rituximab q7d        INTERVAL HISTORY:   Betty Hayes is a 38 y.o. female presenting to clinic today for follow up of Stage I duodenal follicular lymphoma. She was last seen by me on 08/18/22.  Since her last visit, she underwent a cytoscopy with right ureteral stent placement and right pyelogram on 06/10/23. She then had a right pyeloplasty on 07/31/23 with Dr. Liliane Shi.   Today, she states that she is doing well overall. Her appetite level is at 100%. Her energy level is at 100%.  PAST MEDICAL HISTORY:   Past Medical History: Past Medical History:  Diagnosis Date   Allergy    Anxiety    Asthma    Cancer (HCC) 06/2019   follicular lymphoma   Dysphagia    Follicular lymphoma (HCC)    low-grade follicular lymphoma of duodenum   GERD (gastroesophageal reflux disease)    Heart murmur    History of kidney stones    Hypertension    Hypothyroidism due to Hashimoto's thyroiditis    Insulin resistance    Kidney stone    PONV (postoperative nausea and vomiting)    Thyroid nodule 2020   Vitamin D deficiency     Surgical History: Past Surgical History:  Procedure Laterality Date   ABDOMINAL HYSTERECTOMY     BIOPSY  05/09/2020   Procedure: BIOPSY;  Surgeon: Corbin Ade, MD;  Location: [facility];  Service: Endoscopy;;  duodenal   CYSTOSCOPY WITH RETROGRADE PYELOGRAM, URETEROSCOPY AND STENT PLACEMENT Right 06/10/2023   Procedure: CYSTOSCOPY WITH RIGHT RETROGRADE PYELOGRAM, URETEROSCOPY AND RIGHT URETERAL STENT PLACEMENT;  Surgeon: Rene Paci, MD;  Location: [facility];  Service: Urology;   Laterality: Right;  45 MINUTES   ESOPHAGOGASTRODUODENOSCOPY N/A 07/13/2019   Procedure: ESOPHAGOGASTRODUODENOSCOPY (EGD);  Surgeon: Corbin Ade, MD; normal esophagus s/p dilation and biopsied, normal stomach, patchy areas of whitish, nodular mucosa in the second portion of the duodenum s/p biopsy.  Duodenal biopsy with low-grade follicular lymphoma.  Esophageal biopsies benign without increased intraepithelial eosinophils.   ESOPHAGOGASTRODUODENOSCOPY N/A 05/09/2020   Procedure: ESOPHAGOGASTRODUODENOSCOPY (EGD);  Surgeon: Corbin Ade, MD;  Location: [facility];  Service: Endoscopy;  Laterality: N/A;  7:30pm   KIDNEY STONE SURGERY     MALONEY DILATION N/A 07/13/2019   Procedure: MALONEY DILATION;  Surgeon: Corbin Ade, MD;  Location: [facility];  Service: Endoscopy;  Laterality: N/A;   ROBOT ASSISTED PYELOPLASTY Right 07/31/2023   Procedure: XI ROBOTIC ASSISTED RIGHT PYELOPLASTY;  Surgeon: Rene Paci, MD;  Location: [facility];  Service: Urology;  Laterality: Right;  180 MINUTES   TONSILLECTOMY     Around age 62   WISDOM TOOTH EXTRACTION      Social History: Social History   Socioeconomic History   Marital status: Married    Spouse name: Not on file   Number of children:  2   Years of education: Not on file   Highest education level: Not on file  Occupational History    Employer: [employer]  Tobacco Use   Smoking status: Never   Smokeless tobacco: Never  Vaping Use   Vaping status: Never Used  Substance and Sexual Activity   Alcohol use: Yes    Comment: occasional- maybe once a month   Drug use: No   Sexual activity: Yes    Birth control/protection: Surgical    Comment: married 14 years  Other Topics Concern   Not on file  Social History Narrative   Not on file   Social Drivers of Health   Financial Resource Strain: Patient Declined (03/06/2023)   Overall Financial Resource Strain (CARDIA)    Difficulty of Paying Living Expenses: Patient  declined  Food Insecurity: Patient Declined (07/31/2023)   Hunger Vital Sign    Worried About Running Out of Food in the Last Year: Patient declined    Ran Out of Food in the Last Year: Patient declined  Transportation Needs: No Transportation Needs (07/31/2023)   PRAPARE - Administrator, Civil Service (Medical): No    Lack of Transportation (Non-Medical): No  Physical Activity: Insufficiently Active (03/06/2023)   Exercise Vital Sign    Days of Exercise per Week: 1 day    Minutes of Exercise per Session: 20 min  Stress: Patient Declined (03/06/2023)   [employer] of Occupational Health - Occupational Stress Questionnaire    Feeling of Stress : Patient declined  Social Connections: Unknown (03/06/2023)   Social Connection and Isolation Panel [NHANES]    Frequency of Communication with Friends and Family: Patient declined    Frequency of Social Gatherings with Friends and Family: Patient declined    Attends Religious Services: Patient declined    Database administrator or Organizations: Patient declined    Attends Banker Meetings: Not on file    Marital Status: Patient declined  Intimate Partner Violence: Not At Risk (07/31/2023)   Humiliation, Afraid, Rape, and Kick questionnaire    Fear of Current or Ex-Partner: No    Emotionally Abused: No    Physically Abused: No    Sexually Abused: No    Family History: Family History  Problem Relation Age of Onset   Thrombocytopenia Mother        itp   Heart disease Mother    Hypertension Mother    Seizures Brother    ADD / ADHD Brother    Colon cancer Paternal Grandfather        in 48s   Diabetes Maternal Grandmother    Stroke Maternal Grandmother    Heart disease Maternal Grandfather    Stroke Maternal Grandfather    Hypothyroidism Paternal Grandmother    Psoriasis Brother    Asthma Daughter    Kidney disease Maternal Uncle    Non-Hodgkin's lymphoma Paternal Uncle    Leukemia Paternal  Great-grandmother    Ovarian cancer Maternal Great-grandmother    Breast cancer Other        maternal great aunt   Colon polyps Neg Hx     Current Medications:  Current Outpatient Medications:    albuterol (VENTOLIN HFA) 108 (90 Base) MCG/ACT inhaler, TAKE 2 PUFFS BY MOUTH EVERY 6 HOURS AS NEEDED FOR WHEEZE OR SHORTNESS OF BREATH, Disp: 8.5 each, Rfl: 3   Ascorbic Acid (VITAMIN C PO), Take 1 tablet by mouth daily as needed (immune support)., Disp: , Rfl:    Cholecalciferol (  VITAMIN D3) 50 MCG (2000 UT) TABS, Take 2 tablets (4,000 Units total) by mouth daily., Disp: , Rfl:    Cyanocobalamin (VITAMIN B-12 PO), Take 1 tablet by mouth in the morning., Disp: , Rfl:    docusate sodium (COLACE) 100 MG capsule, Take 1 capsule (100 mg total) by mouth 2 (two) times daily as needed for mild constipation., Disp: 30 capsule, Rfl: 0   ECHINACEA PO, Take 760 mg by mouth daily as needed (immune support)., Disp: , Rfl:    hydrOXYzine (VISTARIL) 25 MG capsule, Take 1 capsule (25 mg total) by mouth every 8 (eight) hours as needed. (Patient taking differently: Take 25 mg by mouth in the morning.), Disp: 60 capsule, Rfl: 2   liothyronine (CYTOMEL) 5 MCG tablet, Take 5 mcg by mouth daily before breakfast., Disp: , Rfl:    lisinopril (ZESTRIL) 5 MG tablet, Take 1 tablet (5 mg total) by mouth daily., Disp: 90 tablet, Rfl: 3   MAGNESIUM GLUCONATE PO, Take 120 mg by mouth every evening., Disp: , Rfl:    metFORMIN (GLUCOPHAGE-XR) 500 MG 24 hr tablet, one tab po q lunch * 1 week, then inc to 2 tabs q lunch, Disp: 60 tablet, Rfl: 0   Multiple Vitamins-Minerals (ZINC) LOZG, Take 1 lozenge by mouth daily as needed (immune support)., Disp: , Rfl:    nitrofurantoin, macrocrystal-monohydrate, (MACROBID) 100 MG capsule, Take 100 mg by mouth daily as needed (after sexual intercourse.)., Disp: , Rfl:    ondansetron (ZOFRAN) 4 MG tablet, Take 1 tablet (4 mg total) by mouth daily as needed for nausea or vomiting., Disp: 30 tablet,  Rfl: 1   oxybutynin (DITROPAN) 5 MG tablet, Take 1 tablet (5 mg total) by mouth every 8 (eight) hours as needed for bladder spasms., Disp: 30 tablet, Rfl: 1   oxybutynin (DITROPAN) 5 MG tablet, Take 1 tablet (5 mg total) by mouth every 8 (eight) hours as needed for bladder spasms., Disp: 30 tablet, Rfl: 1   oxyCODONE (ROXICODONE) 5 MG immediate release tablet, Take 1 tablet (5 mg total) by mouth every 8 (eight) hours as needed., Disp: 20 tablet, Rfl: 0   oxyCODONE-acetaminophen (PERCOCET) 5-325 MG tablet, Take 1 tablet by mouth every 4 (four) hours as needed for severe pain (pain score 7-10)., Disp: 20 tablet, Rfl: 0   pantoprazole (PROTONIX) 40 MG tablet, Take 1 tablet (40 mg total) by mouth daily. (Patient taking differently: Take 40 mg by mouth daily as needed (indigestion/heartburn.).), Disp: 90 tablet, Rfl: 3   phenazopyridine (PYRIDIUM) 200 MG tablet, Take 1 tablet (200 mg total) by mouth 3 (three) times daily as needed (for pain with urination)., Disp: 30 tablet, Rfl: 0   Selenium 200 MCG CAPS, Take 200 mcg by mouth in the morning., Disp: , Rfl:    SYNTHROID 88 MCG tablet, Take 88 mcg by mouth daily before breakfast., Disp: , Rfl:    Allergies: Allergies  Allergen Reactions   Latex Rash    REVIEW OF SYSTEMS:   Review of Systems  Constitutional:  Negative for chills, fatigue and fever.  HENT:   Negative for lump/mass, mouth sores, nosebleeds, sore throat and trouble swallowing.   Eyes:  Negative for eye problems.  Respiratory:  Negative for cough and shortness of breath.   Cardiovascular:  Positive for chest pain and palpitations. Negative for leg swelling.  Gastrointestinal:  Negative for abdominal pain, constipation, diarrhea, nausea and vomiting.  Genitourinary:  Negative for bladder incontinence, difficulty urinating, dysuria, frequency, hematuria and nocturia.   Musculoskeletal:  Negative for arthralgias, back pain, flank pain, myalgias and neck pain.  Skin:  Negative for  itching and rash.  Neurological:  Positive for numbness. Negative for dizziness and headaches.  Hematological:  Does not bruise/bleed easily.  Psychiatric/Behavioral:  Positive for sleep disturbance. Negative for depression and suicidal ideas. The patient is not nervous/anxious.   All other systems reviewed and are negative.    VITALS:   Blood pressure 116/88, pulse (!) 102, temperature 98.7 F (37.1 C), temperature source Oral, resp. rate 20, weight 216 lb 0.8 oz (98 kg), SpO2 95%.  Wt Readings from Last 3 Encounters:  08/17/23 216 lb 0.8 oz (98 kg)  07/31/23 221 lb (100.2 kg)  07/21/23 221 lb (100.2 kg)    Body mass index is 34.87 kg/m.  Performance status (ECOG): 1 - Symptomatic but completely ambulatory  PHYSICAL EXAM:   Physical Exam Vitals and nursing note reviewed. Exam conducted with a chaperone present.  Constitutional:      Appearance: Normal appearance.  Cardiovascular:     Rate and Rhythm: Normal rate and regular rhythm.     Pulses: Normal pulses.     Heart sounds: Normal heart sounds.  Pulmonary:     Effort: Pulmonary effort is normal.     Breath sounds: Normal breath sounds.  Abdominal:     Palpations: Abdomen is soft. There is no hepatomegaly, splenomegaly or mass.     Tenderness: There is no abdominal tenderness.  Musculoskeletal:     Right lower leg: No edema.     Left lower leg: No edema.  Lymphadenopathy:     Cervical: No cervical adenopathy.     Right cervical: No superficial, deep or posterior cervical adenopathy.    Left cervical: No superficial, deep or posterior cervical adenopathy.     Upper Body:     Right upper body: No supraclavicular or axillary adenopathy.     Left upper body: No supraclavicular or axillary adenopathy.  Neurological:     General: No focal deficit present.     Mental Status: She is alert and oriented to person, place, and time.  Psychiatric:        Mood and Affect: Mood normal.        Behavior: Behavior normal.      LABS:   CBC     Component Value Date/Time   WBC 10.2 08/10/2023 1439   RBC 4.95 08/10/2023 1439   HGB 13.0 08/10/2023 1439   HGB 13.0 03/31/2023 0820   HCT 40.7 08/10/2023 1439   HCT 40.2 03/31/2023 0820   PLT 294 08/10/2023 1439   PLT 216 03/31/2023 0820   MCV 82.2 08/10/2023 1439   MCV 86 03/31/2023 0820   MCH 26.3 08/10/2023 1439   MCHC 31.9 08/10/2023 1439   RDW 12.6 08/10/2023 1439   RDW 13.2 03/31/2023 0820   LYMPHSABS 2.8 08/10/2023 1439   LYMPHSABS 3.0 03/31/2023 0820   MONOABS 0.9 08/10/2023 1439   EOSABS 0.4 08/10/2023 1439   EOSABS 0.2 03/31/2023 0820   BASOSABS 0.1 08/10/2023 1439   BASOSABS 0.1 03/31/2023 0820    CMP      Component Value Date/Time   NA 137 08/10/2023 1439   NA 140 03/31/2023 0820   K 3.7 08/10/2023 1439   CL 100 08/10/2023 1439   CO2 25 08/10/2023 1439   GLUCOSE 89 08/10/2023 1439   BUN 9 08/10/2023 1439   BUN 14 03/31/2023 0820   CREATININE 0.65 08/10/2023 1439   CALCIUM 9.4 08/10/2023 1439  PROT 7.8 08/10/2023 1439   PROT 6.7 03/31/2023 0820   ALBUMIN 4.0 08/10/2023 1439   ALBUMIN 4.5 03/31/2023 0820   AST 19 08/10/2023 1439   ALT 18 08/10/2023 1439   ALKPHOS 75 08/10/2023 1439   BILITOT 0.5 08/10/2023 1439   BILITOT 0.6 03/31/2023 0820   GFRNONAA >60 08/10/2023 1439   GFRAA >60 01/24/2020 0814     No results found for: "CEA1", "CEA" / No results found for: "CEA1", "CEA" No results found for: "PSA1" No results found for: "CAN199" No results found for: "CAN125"  Lab Results  Component Value Date   TOTALPROTELP 7.1 07/28/2019   ALBUMINELP 4.3 07/28/2019   A1GS 0.2 07/28/2019   A2GS 0.6 07/28/2019   BETS 0.9 07/28/2019   GAMS 1.1 07/28/2019   MSPIKE Not Observed 07/28/2019   SPEI Comment 07/28/2019   No results found for: "TIBC", "FERRITIN", "IRONPCTSAT" Lab Results  Component Value Date   LDH 100 08/10/2023   LDH 127 08/11/2022   LDH 115 07/17/2021     STUDIES:   DG Abd 1 View Result Date:  07/31/2023 CLINICAL DATA:  [id number] Ureteral stent present [id number]. Confirm RIGHT stent placement EXAM: ABDOMEN - 1 VIEW COMPARISON:  CT 04/16/2023. FINDINGS: Double-J RIGHT ureteral stent noted in expected location. Surgical drain in the RIGHT lower quadrant. IMPRESSION: RIGHT double-J ureteral stent appears in proper position by plain film radiography. Electronically Signed   By: Genevive Bi M.D.   On: 07/31/2023 18:14

## 2023-08-17 NOTE — Patient Instructions (Addendum)
[facility] at [facility] Discharge Instructions   You were seen and examined today by Dr. Ellin Saba.  He reviewed the results of your lab work which are normal/stable.   We will see you back in 1 year.   Return as scheduled.    Thank you for choosing [facility] at [facility] to provide your oncology and hematology care.  To afford each patient quality time with our provider, please arrive at least 15 minutes before your scheduled appointment time.   If you have a lab appointment with the Cancer Center please come in thru the Main Entrance and check in at the main information desk.  You need to re-schedule your appointment should you arrive 10 or more minutes late.  We strive to give you quality time with our providers, and arriving late affects you and other patients whose appointments are after yours.  Also, if you no show three or more times for appointments you may be dismissed from the clinic at the providers discretion.     Again, thank you for choosing [facility].  Our hope is that these requests will decrease the amount of time that you wait before being seen by our physicians.       _____________________________________________________________  Should you have questions after your visit to [facility], please contact our office at [phone] and follow the prompts.  Our office hours are 8:00 a.m. and 4:30 p.m. Monday - Friday.  Please note that voicemails left after 4:00 p.m. may not be returned until the following business day.  We are closed weekends and major holidays.  You do have access to a nurse 24-7, just call the main number to the clinic [phone] and do not press any options, hold on the line and a nurse will answer the phone.    For prescription refill requests, have your pharmacy contact our office and allow 72 hours.    Due to Covid, you will need to wear a mask upon entering the  hospital. If you do not have a mask, a mask will be given to you at the Main Entrance upon arrival. For doctor visits, patients may have 1 support person age 44 or older with them. For treatment visits, patients can not have anyone with them due to social distancing guidelines and our immunocompromised population.

## 2023-08-18 ENCOUNTER — Telehealth (INDEPENDENT_AMBULATORY_CARE_PROVIDER_SITE_OTHER): Payer: BC Managed Care – PPO | Admitting: Psychology

## 2023-08-18 DIAGNOSIS — F5089 Other specified eating disorder: Secondary | ICD-10-CM | POA: Diagnosis not present

## 2023-08-18 DIAGNOSIS — F419 Anxiety disorder, unspecified: Secondary | ICD-10-CM | POA: Diagnosis not present

## 2023-08-18 DIAGNOSIS — F909 Attention-deficit hyperactivity disorder, unspecified type: Secondary | ICD-10-CM

## 2023-08-18 NOTE — Progress Notes (Signed)
  Office: [phone]  /  Fax: [phone]    Date: August 18, 2023  Appointment Start Time: 2:02pm Duration: 29 minutes Provider: Lawerance Cruel, Psy.D. Type of Session: Individual Therapy  Location of Patient: Home (private location) Location of Provider: Provider's Home (private office) Type of Contact: Telepsychological Visit via MyChart Video Visit  Session Content: Betty Hayes is a 38 y.o. female presenting for a follow-up appointment to address the previously established treatment goal of increasing coping skills.Today's appointment was a telepsychological visit. [name] provided verbal consent for today's telepsychological appointment and she is aware she is responsible for securing confidentiality on her end of the session. Prior to proceeding with today's appointment, [name]'s physical location at the time of this appointment was obtained as well a phone number she could be reached at in the event of technical difficulties. Gwynneth and this provider participated in today's telepsychological service.   This provider conducted a brief check-in. [name] stated she had surgery, noting she is [facility]. While recovering, she indicated she is not eating a lot due to physical discomfort, but noted she is focusing on protein intake and eating small portions. Reviewed mindful eating strategies. She shared examples. To further assist with mindful eating, psychoeducation regarding an additional exercise was shared. Overall, [name] was receptive to today's appointment as evidenced by openness to sharing, responsiveness to feedback, and willingness to engage in mindfulness exercises to assist with coping.  Mental Status Examination:  Appearance: neat Behavior: appropriate to circumstances Mood: neutral Affect: mood congruent Speech: WNL Eye Contact: appropriate Psychomotor Activity: WNL Gait: unable to assess Thought Process: linear, logical, and goal directed and no evidence or endorsement of suicidal,  homicidal, and self-harm ideation, plan and intent  Thought Content/Perception: no hallucinations, delusions, bizarre thinking or behavior endorsed or observed Orientation: AAOx4 Memory/Concentration: intact Insight: fair Judgment: fair  Interventions:  Conducted a brief chart review Provided empathic reflections and validation Provided positive reinforcement Employed supportive psychotherapy interventions to facilitate reduced distress and to improve coping skills with identified stressors Discussed mindful eating  DSM-5 Diagnosis(es):  F50.89 Other Specified Feeding or Eating Disorder, Emotional Eating Behaviors, F41.9 Unspecified Anxiety Disorder, and F90.9 Unspecified Attention-Deficit/Hyperactivity Disorder   Treatment Goal & Progress: During the initial appointment with this provider, the following treatment goal was established: increase coping skills. [name] has demonstrated progress in her goal as evidenced by increased awareness of hunger patterns and increased awareness of triggers for emotional eating behaviors. [name] also continues to demonstrate willingness to engage in learned skill(s).  Plan: The next appointment is scheduled for 09/08/2023 at 10am, which will be via MyChart Video Visit. The next session will focus on working towards the established treatment goal.   Lawerance Cruel, PsyD

## 2023-08-20 DIAGNOSIS — R8271 Bacteriuria: Secondary | ICD-10-CM | POA: Diagnosis not present

## 2023-08-24 ENCOUNTER — Ambulatory Visit (INDEPENDENT_AMBULATORY_CARE_PROVIDER_SITE_OTHER): Payer: BC Managed Care – PPO | Admitting: Adult Health

## 2023-08-24 ENCOUNTER — Encounter (INDEPENDENT_AMBULATORY_CARE_PROVIDER_SITE_OTHER): Payer: Self-pay | Admitting: Adult Health

## 2023-08-24 VITALS — BP 114/76 | HR 70 | Temp 97.6°F | Ht 66.0 in | Wt 217.0 lb

## 2023-08-24 DIAGNOSIS — E559 Vitamin D deficiency, unspecified: Secondary | ICD-10-CM

## 2023-08-24 DIAGNOSIS — E88819 Insulin resistance, unspecified: Secondary | ICD-10-CM | POA: Diagnosis not present

## 2023-08-24 DIAGNOSIS — Z87448 Personal history of other diseases of urinary system: Secondary | ICD-10-CM

## 2023-08-24 DIAGNOSIS — E66812 Obesity, class 2: Secondary | ICD-10-CM

## 2023-08-24 DIAGNOSIS — I10 Essential (primary) hypertension: Secondary | ICD-10-CM

## 2023-08-24 DIAGNOSIS — E669 Obesity, unspecified: Secondary | ICD-10-CM

## 2023-08-24 DIAGNOSIS — Z9889 Other specified postprocedural states: Secondary | ICD-10-CM

## 2023-08-24 DIAGNOSIS — Z6835 Body mass index (BMI) 35.0-35.9, adult: Secondary | ICD-10-CM

## 2023-08-24 MED ORDER — METFORMIN HCL ER 500 MG PO TB24: ORAL_TABLET | ORAL | Status: DC

## 2023-08-24 NOTE — Progress Notes (Signed)
WEIGHT SUMMARY AND BIOMETRICS  Vitals Temp: 97.6 F (36.4 C) BP: 114/76 Pulse Rate: 70 SpO2: 97 %   Anthropometric Measurements Height: 5\' 6"  (1.676 m) Weight: 217 lb (98.4 kg) BMI (Calculated): 35.04 Weight at Last Visit: 218lb Weight Lost Since Last Visit: 1lb Weight Gained Since Last Visit: 0 Starting Weight: 220lb Total Weight Loss (lbs): 3 lb (1.361 kg) Peak Weight: 213lb   Body Composition  Body Fat %: 44.2 % Fat Mass (lbs): 96 lbs Muscle Mass (lbs): 115 lbs Total Body Water (lbs): 84.2 lbs Visceral Fat Rating : 10   Other Clinical Data Fasting: yes Labs: yes Today's Visit #: 7 Starting Date: 04/14/23    Chief Complaint:   OBESITY Betty Hayes is here to discuss her progress with her obesity treatment plan. She is on the the Category 2 Plan and states she is following her eating plan approximately 25 % of the time.  She states she is exercising: None.   Interim History:  She lives with her husband and her two children, age 3 and 15. 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 She is currently working remotely as she is recovering from XI ROBOTIC ASSISTED RIGHT PYELOPLASTY  on 07/31/2023  Hunger/appetite-over suppressed with Metformin 500mg  BID, she has reduced back to 1 tab daily.  Exercise-NONE- s/p pyeloplasty  Hydration-she reports drinking fluids all day, mostly water  Subjective:   1. H/O pyeloplasty 07/24/2023: Here today for preoperative appointment prior to undergoing right robotic assisted pyeloplasty with Dr. Liliane Shi on 1/10. Overall doing well. About a week ago she had some right flank pain and discomfort which also correlated with reduced force of stream and urine output. The symptoms have since resolved. She is not having any dysuria or gross hematuria. Denies flank or lower back pain at present time. She tells me she has been recently diagnosed with insulin resistance and her primary care provider  has placed her on metformin more of a preventative strategy. She denies any other changes in past medical history or prescription medications taken on a daily basis. She is not on any type of antimicrobial treatment. Patient is afebrile, denies nausea or vomiting. She has had no recent fevers or chills, chest pain or shortness of breath.  The risks, benefits and alternatives of RIGHT robot-assisted pyeloplasty was discussed with the patient. Risks include, but are not limited to, bleeding, infection, recurrence of the UPJ obstruction, ureteral stricture disease, urine leak, chronic pain, nephrectomy, multiple surgeries to correct her UPJ obstruction and the need for an indwelling stent and in the inherent risks of general anesthesia. The patient voices understanding.  XI ROBOTIC ASSISTED RIGHT PYELOPLASTY on 07/31/2023 History and Physical: For full details, please see admission history and physical. Briefly, Betty Hayes is a 38 y.o. year old patient with UPJ obstruction on the right side now s/p right robotic pyeloplasty with stent placement.    Hospital Course: Patient tolerated the procedure well.  She was then transferred to the floor after an uneventful PACU stay.  Her hospital course was uncomplicated.  On POD#2 she had met discharge criteria: was eating a regular diet, was up and ambulating independently,  pain was well controlled, was voiding without a catheter, and was ready to for discharge. Patient had flatus and was tolerating regular diet.  She has had one post op visit-current restrictions: no lifting >10 lbs, no bending/pushing/pulling/twisting.  She will have stent removed 09/12/2023  2. Vitamin D deficiency  She is currently taking OTC Vit D3 2,000 international units, 2 caps a day= 4,000 international units/day  3. Insulin resistance At last OV Metformin 500mg  was increased from every day to BID. When taking BID she experienced over suppressed appetite and "light headedness". She  has reduced back down to Metformin 500mg  - on tab daily at lunch- denies medication SE currently.  4. Essential hypertension BP excellent and at OV She is on  lisinopril (ZESTRIL) 5 MG tablet   Assessment/Plan:   1. H/O pyeloplasty Continue to follow post surgical instructions F/u with surgeon as advised  2. Vitamin D deficiency Check Labs - VITAMIN D 25 Hydroxy (Vit-D Deficiency, Fractures)  3. Insulin resistance (Primary) Check Labs - Hemoglobin A1c - Vitamin B12 - Magnesium - Insulin, random Continue - metFORMIN (GLUCOPHAGE-XR) 500 MG 24 hr tablet; one tab po q lunch  4. Essential hypertension Check Labs - Basic Metabolic Panel (BMET)  5. BMI 35.0-35.9,adult current BMi 35.04  [name] is currently in the action stage of change. As such, her goal is to continue with weight loss efforts. She has agreed to the Category 2 Plan.   Exercise goals: No exercise has been prescribed at this time.  Behavioral modification strategies: increasing lean protein intake, decreasing simple carbohydrates, increasing vegetables, increasing water intake, no skipping meals, meal planning and cooking strategies, keeping healthy foods in the home, and planning for success.  [name] has agreed to follow-up with our clinic in 4 weeks. She was informed of the importance of frequent follow-up visits to maximize her success with intensive lifestyle modifications for her multiple health conditions.   [name] was informed we would discuss her lab results at her next visit unless there is a critical issue that needs to be addressed sooner. [name] agreed to keep her next visit at the agreed upon time to discuss these results.  Objective:   Blood pressure 114/76, pulse 70, temperature 97.6 F (36.4 C), height 5\' 6"  (1.676 m), weight 217 lb (98.4 kg), SpO2 97%. Body mass index is 35.02 kg/m.  General: Cooperative, alert, well developed, in no acute distress. HEENT: Conjunctivae and lids  unremarkable. Cardiovascular: Regular rhythm.  Lungs: Normal work of breathing. Neurologic: No focal deficits.   Lab Results  Component Value Date   CREATININE 0.65 08/10/2023   BUN 9 08/10/2023   NA 137 08/10/2023   K 3.7 08/10/2023   CL 100 08/10/2023   CO2 25 08/10/2023   Lab Results  Component Value Date   ALT 18 08/10/2023   AST 19 08/10/2023   ALKPHOS 75 08/10/2023   BILITOT 0.5 08/10/2023   Lab Results  Component Value Date   HGBA1C 5.5 03/31/2023   HGBA1C 5.0 08/15/2016   Lab Results  Component Value Date   INSULIN 18.0 03/31/2023   Lab Results  Component Value Date   TSH 0.400 (L) 03/31/2023   Lab Results  Component Value Date   CHOL 177 03/31/2023   HDL 63 03/31/2023   LDLCALC 102 (H) 03/31/2023   TRIG 61 03/31/2023   CHOLHDL 2.7 04/07/2022   Lab Results  Component Value Date   VD25OH 30.3 03/31/2023   Lab Results  Component Value Date   WBC 10.2 08/10/2023   HGB 13.0 08/10/2023   HCT 40.7 08/10/2023   MCV 82.2 08/10/2023   PLT 294 08/10/2023   No results found for: "IRON", "TIBC", "FERRITIN"  Attestation Statements:   Reviewed by clinician on day of visit: allergies, medications, problem list, medical history, surgical history, family  history, social history, and previous encounter notes.  I have reviewed the above documentation for accuracy and completeness, and I agree with the above. -  Helem Reesor d. Brookie Wayment, NP-C

## 2023-08-25 LAB — BASIC METABOLIC PANEL
BUN/Creatinine Ratio: 16 (ref 9–23)
BUN: 11 mg/dL (ref 6–20)
CO2: 21 mmol/L (ref 20–29)
Calcium: 9 mg/dL (ref 8.7–10.2)
Chloride: 106 mmol/L (ref 96–106)
Creatinine, Ser: 0.68 mg/dL (ref 0.57–1.00)
Glucose: 89 mg/dL (ref 70–99)
Potassium: 4.7 mmol/L (ref 3.5–5.2)
Sodium: 142 mmol/L (ref 134–144)
eGFR: 114 mL/min/{1.73_m2} (ref >59)

## 2023-08-25 LAB — VITAMIN D 25 HYDROXY (VIT D DEFICIENCY, FRACTURES): Vit D, 25-Hydroxy: 29.8 ng/mL — ABNORMAL LOW (ref 30.0–100.0)

## 2023-08-25 LAB — VITAMIN B12: Vitamin B-12: 564 pg/mL (ref 232–1245)

## 2023-08-25 LAB — INSULIN, RANDOM: INSULIN: 16.3 u[IU]/mL (ref 2.6–24.9)

## 2023-08-25 LAB — HEMOGLOBIN A1C
Est. average glucose Bld gHb Est-mCnc: 111 mg/dL
Hgb A1c MFr Bld: 5.5 % (ref 4.8–5.6)

## 2023-08-25 LAB — MAGNESIUM: Magnesium: 2 mg/dL (ref 1.6–2.3)

## 2023-09-08 ENCOUNTER — Telehealth (INDEPENDENT_AMBULATORY_CARE_PROVIDER_SITE_OTHER): Payer: BC Managed Care – PPO | Admitting: Psychology

## 2023-09-08 DIAGNOSIS — F5089 Other specified eating disorder: Secondary | ICD-10-CM

## 2023-09-08 DIAGNOSIS — F419 Anxiety disorder, unspecified: Secondary | ICD-10-CM | POA: Diagnosis not present

## 2023-09-08 DIAGNOSIS — F909 Attention-deficit hyperactivity disorder, unspecified type: Secondary | ICD-10-CM

## 2023-09-08 NOTE — Progress Notes (Signed)
  Office: [phone]  /  Fax: [phone]    Date: September 08, 2023  Appointment Start Time: 10:00am Duration: 25 minutes Provider: Lawerance Cruel, Psy.D. Type of Session: Individual Therapy  Location of Patient: Home (private location) Location of Provider: Provider's Home (private office) Type of Contact: Telepsychological Visit via MyChart Video Visit  Session Content: Betty Hayes is a 38 y.o. female presenting for a follow-up appointment to address the previously established treatment goal of increasing coping skills.Today's appointment was a telepsychological visit. Betty Hayes provided verbal consent for today's telepsychological appointment and she is aware she is responsible for securing confidentiality on her end of the session. Prior to proceeding with today's appointment, Betty Hayes's physical location at the time of this appointment was obtained as well a phone number she could be reached at in the event of technical difficulties. Betty Hayes and this provider participated in today's telepsychological service.   This provider conducted a brief check-in. Betty Hayes shared she is still recovering from surgery and working from home. Regarding eating habits, she noted her "appetite still isn't back up." Nevertheless, she reported she is still focusing on her protein intake. Reviewed triggers for emotional eating behaviors. Betty Hayes was engaged in problem solving to develop a plan to help cope with urges/cravings involving activities to relax, activities to distract, comforting places, people to call and connect with, and activities that help soothe senses. She was observed typing the plan.  Overall, Betty Hayes was receptive to today's appointment as evidenced by openness to sharing, responsiveness to feedback, and willingness to implement discussed strategies .  Mental Status Examination:  Appearance: neat Behavior: appropriate to circumstances Mood: neutral Affect: mood congruent Speech: WNL Eye Contact:  appropriate Psychomotor Activity: WNL Gait: unable to assess Thought Process: linear, logical, and goal directed and no evidence or endorsement of suicidal, homicidal, and self-harm ideation, plan and intent  Thought Content/Perception: no hallucinations, delusions, bizarre thinking or behavior endorsed or observed Orientation: AAOx4 Memory/Concentration: intact Insight: good Judgment: good  Interventions:  Conducted a brief chart review Provided empathic reflections and validation Provided positive reinforcement Employed supportive psychotherapy interventions to facilitate reduced distress and to improve coping skills with identified stressors Engaged patient in problem solving  DSM-5 Diagnosis(es): F50.89 Other Specified Feeding or Eating Disorder, Emotional Eating Behaviors, F41.9 Unspecified Anxiety Disorder, and F90.9 Unspecified Attention-Deficit/Hyperactivity Disorder   Treatment Goal & Progress: During the initial appointment with this provider, the following treatment goal was established: increase coping skills. Betty Hayes has demonstrated progress in her goal as evidenced by increased awareness of hunger patterns and increased awareness of triggers for emotional eating behaviors. Betty Hayes also continues to demonstrate willingness to engage in learned skill(s).  Plan: The next appointment is scheduled for 09/29/2023 at 11:30am, which will be via MyChart Video Visit. The next session will focus on working towards the established treatment goal.   Lawerance Cruel, PsyD

## 2023-09-11 DIAGNOSIS — R8271 Bacteriuria: Secondary | ICD-10-CM | POA: Diagnosis not present

## 2023-09-11 DIAGNOSIS — Q6211 Congenital occlusion of ureteropelvic junction: Secondary | ICD-10-CM | POA: Diagnosis not present

## 2023-09-21 ENCOUNTER — Ambulatory Visit (INDEPENDENT_AMBULATORY_CARE_PROVIDER_SITE_OTHER): Payer: BC Managed Care – PPO | Admitting: Adult Health

## 2023-09-21 ENCOUNTER — Encounter (INDEPENDENT_AMBULATORY_CARE_PROVIDER_SITE_OTHER): Payer: Self-pay | Admitting: Adult Health

## 2023-09-21 VITALS — BP 113/77 | HR 85 | Temp 99.2°F | Ht 66.0 in | Wt 216.0 lb

## 2023-09-21 DIAGNOSIS — E559 Vitamin D deficiency, unspecified: Secondary | ICD-10-CM | POA: Diagnosis not present

## 2023-09-21 DIAGNOSIS — I10 Essential (primary) hypertension: Secondary | ICD-10-CM | POA: Diagnosis not present

## 2023-09-21 DIAGNOSIS — Z6835 Body mass index (BMI) 35.0-35.9, adult: Secondary | ICD-10-CM

## 2023-09-21 DIAGNOSIS — E669 Obesity, unspecified: Secondary | ICD-10-CM

## 2023-09-21 DIAGNOSIS — E88819 Insulin resistance, unspecified: Secondary | ICD-10-CM | POA: Diagnosis not present

## 2023-09-21 MED ORDER — METFORMIN HCL ER 500 MG PO TB24
ORAL_TABLET | ORAL | 0 refills | Status: DC
Start: 2023-09-21 — End: 2023-10-19

## 2023-09-21 MED ORDER — VITAMIN D (ERGOCALCIFEROL) 1.25 MG (50000 UNIT) PO CAPS: 50000.0000 [IU] | ORAL_CAPSULE | ORAL | 0 refills | Status: DC

## 2023-09-21 NOTE — Progress Notes (Signed)
 WEIGHT SUMMARY AND BIOMETRICS  Vitals Temp: 99.2 F (37.3 C) BP: 113/77 Pulse Rate: 85 SpO2: 98 %   Anthropometric Measurements Height: 5\' 6"  (1.676 m) Weight: 216 lb (98 kg) BMI (Calculated): 34.88 Weight at Last Visit: 217 lb Weight Lost Since Last Visit: 1 lb Weight Gained Since Last Visit: 0 Starting Weight: 220 lb Total Weight Loss (lbs): 4 lb (1.814 kg) Peak Weight: 213 lb   Body Composition  Body Fat %: 42.7 % Fat Mass (lbs): 92.6 lbs Muscle Mass (lbs): 118 lbs Total Body Water (lbs): 85 lbs Visceral Fat Rating : 10   Other Clinical Data Fasting: no Labs: no Today's Visit #: 8 Starting Date: 04/14/23    Chief Complaint:   OBESITY Betty Hayes is here to discuss her progress with her obesity treatment plan.  She is on the the Category 2 Plan and states she is following her eating plan approximately 75 % of the time.  She states she is exercising: None   Interim History:  Reviewed Bioimpedance results with pt: Muscle Mass: + 3 lbs Adipose Mass: -3.4 lbs  Exercise-None  Hydration-she estimates to drink >80 oz water/day  She has been working with Dr. Dewaine Conger- Bariatric Psychologist Ms. Dorsi is still challenged to consume all the min prescribed caloric intake, ie: 1200 She estimates to routinely consume [phone] cal, however she is very mindful of adequate protein intake- supported by increase in muscle mass  Subjective:   1. Vitamin D deficiency Discussed Labs  Latest Reference Range & Units 08/24/23 12:52  Vitamin D, 25-Hydroxy 30.0 - 100.0 ng/mL 29.8 (L)      (L): Data is abnormally low  Level below goal of 50-70 She is on OTC Vit D 3 4,000 international units daily  2. Insulin resistance Discussed Labs  Latest Reference Range & Units 08/24/23 12:52  Glucose 70 - 99 mg/dL 89  Hemoglobin Z6X 4.8 - 5.6 % 5.5  Est. average glucose Bld gHb Est-mCnc mg/dL 096  INSULIN 2.6 - 04.5 uIU/mL 16.3           Vitamin B12 232 - 1,245 pg/mL 564   (L): Data is abnormally low  CBG and A1c at goal Insulin level above goal of 5 She is on daily Metformin XR 500mg   She denies GI upset  3. Essential hypertension Discussed Labs 08/24/2023 BMP: Stable She is on daily Lisinopril 5mg   Assessment/Plan:   1. Vitamin D deficiency Stop OTC Vit D 3 supplementation Start Vitamin D, Ergocalciferol, (DRISDOL) 1.25 MG (50000 UNIT) CAPS capsule Take 1 capsule (50,000 Units total) by mouth every 7 (seven) days. Dispense: 4 capsule, Refills: 0 ordered   2. Insulin resistance (Primary) Refill   metFORMIN (GLUCOPHAGE-XR) 500 MG 24 hr tablet one tab po q lunch Dispense: 30 tablet, Refills: 0 ordered   3. Essential hypertension Continue Cat 2 Meal Plan Think about what physical activity is reasonable and sustainable   4. BMI 35.0-35.9,adult current BMI 35.0  Betty Hayes is currently in the action stage of change. As such, her goal is to continue with weight loss efforts. She has agreed to the Category 2 Plan.   Exercise goals: All adults should avoid inactivity. Some physical activity is better than none, and adults who participate in any amount of physical activity gain some health benefits. Adults should also include muscle-strengthening activities that involve all major muscle groups on 2 or more days a week.  Behavioral modification strategies: increasing lean protein intake, decreasing simple carbohydrates, increasing vegetables, increasing water  intake, meal planning and cooking strategies, keeping healthy foods in the home, ways to avoid boredom eating, ways to avoid night time snacking, and planning for success.  Betty Hayes has agreed to follow-up with our clinic in 4 weeks. She was informed of the importance of frequent follow-up visits to maximize her success with intensive lifestyle modifications for her multiple health conditions.   Objective:   Blood pressure 113/77, pulse 85, temperature 99.2 F (37.3 C), height 5\' 6"  (1.676 m), weight 216  lb (98 kg), SpO2 98%. Body mass index is 34.86 kg/m.  General: Cooperative, alert, well developed, in no acute distress. HEENT: Conjunctivae and lids unremarkable. Cardiovascular: Regular rhythm.  Lungs: Normal work of breathing. Neurologic: No focal deficits.   Lab Results  Component Value Date   CREATININE 0.68 08/24/2023   BUN 11 08/24/2023   NA 142 08/24/2023   K 4.7 08/24/2023   CL 106 08/24/2023   CO2 21 08/24/2023   Lab Results  Component Value Date   ALT 18 08/10/2023   AST 19 08/10/2023   ALKPHOS 75 08/10/2023   BILITOT 0.5 08/10/2023   Lab Results  Component Value Date   HGBA1C 5.5 08/24/2023   HGBA1C 5.5 03/31/2023   HGBA1C 5.0 08/15/2016   Lab Results  Component Value Date   INSULIN 16.3 08/24/2023   INSULIN 18.0 03/31/2023   Lab Results  Component Value Date   TSH 0.400 (L) 03/31/2023   Lab Results  Component Value Date   CHOL 177 03/31/2023   HDL 63 03/31/2023   LDLCALC 102 (H) 03/31/2023   TRIG 61 03/31/2023   CHOLHDL 2.7 04/07/2022   Lab Results  Component Value Date   VD25OH 29.8 (L) 08/24/2023   VD25OH 30.3 03/31/2023   Lab Results  Component Value Date   WBC 10.2 08/10/2023   HGB 13.0 08/10/2023   HCT 40.7 08/10/2023   MCV 82.2 08/10/2023   PLT 294 08/10/2023   No results found for: "IRON", "TIBC", "FERRITIN"  Attestation Statements:   Reviewed by clinician on day of visit: allergies, medications, problem list, medical history, surgical history, family history, social history, and previous encounter notes.  I have reviewed the above documentation for accuracy and completeness, and I agree with the above. -  Laykin Rainone d. Mardee Clune, NP-C

## 2023-09-29 ENCOUNTER — Telehealth (INDEPENDENT_AMBULATORY_CARE_PROVIDER_SITE_OTHER): Payer: BC Managed Care – PPO | Admitting: Psychology

## 2023-10-08 ENCOUNTER — Other Ambulatory Visit (HOSPITAL_COMMUNITY): Payer: Self-pay | Admitting: Urology

## 2023-10-08 DIAGNOSIS — Q6239 Other obstructive defects of renal pelvis and ureter: Secondary | ICD-10-CM

## 2023-10-14 ENCOUNTER — Inpatient Hospital Stay (HOSPITAL_COMMUNITY): Admission: RE | Admit: 2023-10-14 | Source: Ambulatory Visit

## 2023-10-15 ENCOUNTER — Encounter (HOSPITAL_COMMUNITY)
Admission: RE | Admit: 2023-10-15 | Discharge: 2023-10-15 | Disposition: A | Discharge status: Home / Self Care | Source: Ambulatory Visit | Attending: Urology | Admitting: Urology

## 2023-10-15 ENCOUNTER — Encounter (HOSPITAL_COMMUNITY): Payer: Self-pay

## 2023-10-15 DIAGNOSIS — N1330 Unspecified hydronephrosis: Secondary | ICD-10-CM | POA: Diagnosis not present

## 2023-10-15 DIAGNOSIS — N2889 Other specified disorders of kidney and ureter: Secondary | ICD-10-CM | POA: Diagnosis not present

## 2023-10-15 DIAGNOSIS — Q6239 Other obstructive defects of renal pelvis and ureter: Secondary | ICD-10-CM | POA: Diagnosis not present

## 2023-10-15 MED ORDER — FUROSEMIDE 10 MG/ML IJ SOLN
INTRAMUSCULAR | Status: AC
Start: 2023-10-15 — End: ?
  Filled 2023-10-15: qty 6

## 2023-10-15 MED ORDER — FUROSEMIDE 10 MG/ML IJ SOLN
50.0000 mg | Freq: Once | INTRAMUSCULAR | Status: AC
  Administered 2023-10-15: 50 mg via INTRAVENOUS

## 2023-10-15 MED ORDER — TECHNETIUM TC 99M MERTIATIDE
5.5000 | Freq: Once | INTRAVENOUS | Status: AC | PRN
  Administered 2023-10-15: 5.5 via INTRAVENOUS

## 2023-10-18 ENCOUNTER — Other Ambulatory Visit: Payer: Self-pay | Admitting: Nurse Practitioner

## 2023-10-18 DIAGNOSIS — I10 Essential (primary) hypertension: Secondary | ICD-10-CM

## 2023-10-19 ENCOUNTER — Ambulatory Visit (INDEPENDENT_AMBULATORY_CARE_PROVIDER_SITE_OTHER): Admitting: Adult Health

## 2023-10-19 ENCOUNTER — Telehealth (INDEPENDENT_AMBULATORY_CARE_PROVIDER_SITE_OTHER): Payer: Self-pay | Admitting: *Deleted

## 2023-10-19 ENCOUNTER — Telehealth: Payer: Self-pay | Admitting: *Deleted

## 2023-10-19 VITALS — BP 108/70 | HR 73 | Temp 98.8°F | Ht 66.0 in | Wt 213.0 lb

## 2023-10-19 DIAGNOSIS — E559 Vitamin D deficiency, unspecified: Secondary | ICD-10-CM | POA: Diagnosis not present

## 2023-10-19 DIAGNOSIS — E669 Obesity, unspecified: Secondary | ICD-10-CM

## 2023-10-19 DIAGNOSIS — Z6835 Body mass index (BMI) 35.0-35.9, adult: Secondary | ICD-10-CM

## 2023-10-19 DIAGNOSIS — E88819 Insulin resistance, unspecified: Secondary | ICD-10-CM

## 2023-10-19 DIAGNOSIS — R110 Nausea: Secondary | ICD-10-CM

## 2023-10-19 DIAGNOSIS — Z6834 Body mass index (BMI) 34.0-34.9, adult: Secondary | ICD-10-CM

## 2023-10-19 DIAGNOSIS — I10 Essential (primary) hypertension: Secondary | ICD-10-CM | POA: Diagnosis not present

## 2023-10-19 MED ORDER — ZEPBOUND 2.5 MG/0.5ML ~~LOC~~ SOAJ: 2.5000 mg | SUBCUTANEOUS | 0 refills | Status: DC

## 2023-10-19 MED ORDER — VITAMIN D (ERGOCALCIFEROL) 1.25 MG (50000 UNIT) PO CAPS: 50000.0000 [IU] | ORAL_CAPSULE | ORAL | 0 refills | Status: DC

## 2023-10-19 NOTE — Telephone Encounter (Signed)
 Ammie from [facility] call and left a message checking on a referral for pt. I returned her phone call and informed her that we did received this referral and someone will be in contact with pt as soon as possible.

## 2023-10-19 NOTE — Progress Notes (Signed)
 WEIGHT SUMMARY AND BIOMETRICS  Vitals Temp: 98.8 F (37.1 C) BP: 108/70 Pulse Rate: 73 SpO2: 97 %   Anthropometric Measurements Height: 5\' 6"  (1.676 m) Weight: 213 lb (96.6 kg) BMI (Calculated): 34.4 Weight at Last Visit: 216 LB Weight Lost Since Last Visit: 3 lb Weight Gained Since Last Visit: 0 Starting Weight: 220 LB Total Weight Loss (lbs): 7 lb (3.175 kg) Peak Weight: 213 LB   Body Composition  Body Fat %: 42.6 % Fat Mass (lbs): 91 lbs Muscle Mass (lbs): 116.4 lbs Total Body Water (lbs): 85.6 lbs Visceral Fat Rating : 10   Other Clinical Data Fasting: NO Labs: NO Today's Visit #: 9 Starting Date: 04/14/23    Chief Complaint:   OBESITY Betty Hayes is here to discuss her progress with her obesity treatment plan.  She is on the the Category 2 Plan and states she is following her eating plan approximately 90 % of the time.  She states she is exercising Walking 10 minutes 2 times per week.   Interim History:  She has changed Metformin XR 500mg  from lunch to dinner the lat 4 weeks. She reports nausea without vomiting the last 2.5 weeks. Last dose of evening Metformin XR 500mg  on 10/16/2023  Hydration-she continues her excellent hydration, at least 80 oz water/day  Reviewed Bioimpedance results with pt: Muscle Mass:-1.6 lbs Adipose Mass: -1.6 lbs  Subjective:   1. Essential hypertension BP at goal at OV She has started a walking regime She denies CP with exertion She is on daily low dose Lisinopril 5mg   2. Vitamin D deficiency  Latest Reference Range & Units 08/24/23 12:52  Vitamin D, 25-Hydroxy 30.0 - 100.0 ng/mL 29.8 (L)  (L): Data is abnormally low  She is on weekly Ergocalciferol- denies N/V/Muscle Weakness  3. Insulin resistance She has changed Metformin XR 500mg  from lunch to dinner the lat 4 weeks. She reports nausea without vomiting the last 2.5 weeks. Last dose of evening Metformin XR 500mg  on 10/16/2023  She denies family hx of  MENS 2 ot MTC She denies personal hx of pancreatitis Hx of 2016- hysterectomy  Discussed risks/benefits of Zepbound therapy  4. Nausea without Vomiting She has long hx of GERD and Nausea She has was last seen by [facility] Summer 2021 She would like referral to this specific provider [facility]  Assessment/Plan:   1. Essential hypertension Limit Na+ Continue regular walking and low dose ACE   2. Vitamin D deficiency (Primary) Refill Vitamin D, Ergocalciferol, (DRISDOL) 1.25 MG (50000 UNIT) CAPS capsule Take 1 capsule (50,000 Units total) by mouth every 7 (seven) days. Dispense: 4 capsule, Refills: 0 ordered   3. Insulin resistance Remain off Metformin Increase daily protein Remain active Start weekly Zepbound as directed  4. Nausea without Vomiting Referral Procedure Modifiers Provider Requested Approved  REF25 - AMB REFERRAL TO GASTROENTEROLOGY none Lolita Rieger, RPH 1 1   5. BMI 35.0-35.9,adult current BMI 34.5 Start tirzepatide (ZEPBOUND) 2.5 MG/0.5ML Pen Inject 2.5 mg into the skin once a week. Dispense: 3 mL, Refills: 0 ordered   Betty Hayes is currently in the action stage of change. As such, her goal is to continue with weight loss efforts. She has agreed to the Category 2 Plan.   Exercise goals: For substantial health benefits, adults should do at least 150 minutes (2 hours and 30 minutes) a week of moderate-intensity, or 75 minutes (1 hour and 15 minutes) a week of  vigorous-intensity aerobic physical activity, or an equivalent combination of moderate- and vigorous-intensity aerobic activity. Aerobic activity should be performed in episodes of at least 10 minutes, and preferably, it should be spread throughout the week.  Behavioral modification strategies: increasing lean protein intake, decreasing simple carbohydrates, increasing vegetables, increasing water intake, meal planning and  cooking strategies, keeping healthy foods in the home, ways to avoid boredom eating, and planning for success.  Betty Hayes has agreed to follow-up with our clinic in 4 weeks. She was informed of the importance of frequent follow-up visits to maximize her success with intensive lifestyle modifications for her multiple health conditions.   Objective:   Blood pressure 108/70, pulse 73, temperature 98.8 F (37.1 C), height 5\' 6"  (1.676 m), weight 213 lb (96.6 kg), SpO2 97%. Body mass index is 34.38 kg/m.  General: Cooperative, alert, well developed, in no acute distress. HEENT: Conjunctivae and lids unremarkable. Cardiovascular: Regular rhythm.  Lungs: Normal work of breathing. Neurologic: No focal deficits.   Lab Results  Component Value Date   CREATININE 0.68 08/24/2023   BUN 11 08/24/2023   NA 142 08/24/2023   K 4.7 08/24/2023   CL 106 08/24/2023   CO2 21 08/24/2023   Lab Results  Component Value Date   ALT 18 08/10/2023   AST 19 08/10/2023   ALKPHOS 75 08/10/2023   BILITOT 0.5 08/10/2023   Lab Results  Component Value Date   HGBA1C 5.5 08/24/2023   HGBA1C 5.5 03/31/2023   HGBA1C 5.0 08/15/2016   Lab Results  Component Value Date   INSULIN 16.3 08/24/2023   INSULIN 18.0 03/31/2023   Lab Results  Component Value Date   TSH 0.400 (L) 03/31/2023   Lab Results  Component Value Date   CHOL 177 03/31/2023   HDL 63 03/31/2023   LDLCALC 102 (H) 03/31/2023   TRIG 61 03/31/2023   CHOLHDL 2.7 04/07/2022   Lab Results  Component Value Date   VD25OH 29.8 (L) 08/24/2023   VD25OH 30.3 03/31/2023   Lab Results  Component Value Date   WBC 10.2 08/10/2023   HGB 13.0 08/10/2023   HCT 40.7 08/10/2023   MCV 82.2 08/10/2023   PLT 294 08/10/2023   No results found for: "IRON", "TIBC", "FERRITIN"  Attestation Statements:   Reviewed by clinician on day of visit: allergies, medications, problem list, medical history, surgical history, family history, social history, and  previous encounter notes.  I have reviewed the above documentation for accuracy and completeness, and I agree with the above. -  Eliya Bubar d. Theodora Lalanne, NP-C

## 2023-10-19 NOTE — Telephone Encounter (Signed)
 Called [facility] twice for confirmation of a referral (Kristin Harper)sent by our office(Katy).  No answer, left message and updated the patient and explained to her to reach out to them if she has not heard anything in a few days, verbalized understanding.

## 2023-10-19 NOTE — Telephone Encounter (Signed)
 Spoke with Toni Amend at [facility], confirmed the referral sent, they will contact patient to schedule appointment.Updated the patient.

## 2023-10-20 ENCOUNTER — Telehealth (INDEPENDENT_AMBULATORY_CARE_PROVIDER_SITE_OTHER): Admitting: Psychology

## 2023-10-20 ENCOUNTER — Other Ambulatory Visit (INDEPENDENT_AMBULATORY_CARE_PROVIDER_SITE_OTHER): Payer: Self-pay | Admitting: Adult Health

## 2023-10-20 NOTE — Telephone Encounter (Signed)
 Noted! Thank you

## 2023-10-21 ENCOUNTER — Telehealth (INDEPENDENT_AMBULATORY_CARE_PROVIDER_SITE_OTHER): Payer: Self-pay

## 2023-10-21 NOTE — Telephone Encounter (Signed)
 Dear Betty Hayes:  We're pleased to let you know that we've approved your or your doctor's request for coverage for Zepbound Injection (tirzepatide). You can now fill your prescription, and it will be covered according to your plan.  As long as you remain covered by your prescription drug plan and there are no changes to your plan benefits, this request is approved from 10/20/2023 to 06/20/2024. When this approval expires, please speak to your doctor about your treatment.

## 2023-10-22 ENCOUNTER — Encounter: Payer: Self-pay | Admitting: Family Medicine

## 2023-10-22 NOTE — Telephone Encounter (Signed)
**Note De-identified  [place] Obfuscation** Please advise 

## 2023-10-25 ENCOUNTER — Other Ambulatory Visit (INDEPENDENT_AMBULATORY_CARE_PROVIDER_SITE_OTHER): Payer: Self-pay | Admitting: Adult Health

## 2023-10-25 DIAGNOSIS — E88819 Insulin resistance, unspecified: Secondary | ICD-10-CM

## 2023-10-25 NOTE — Progress Notes (Unsigned)
 GI Office Note    Referring Provider: Dettinger, Elige Radon, MD Primary Care Physician:  Dettinger, Elige Radon, MD  Primary Gastroenterologist: Gerrit Friends.Rourk, MD  Chief Complaint   No chief complaint on file.   History of Present Illness   Betty Hayes is a 38 y.o. female presenting today at the request of Dettinger, Elige Radon, MD for nausea ***.   EGD 07/13/2019: -  EGD 05/09/2020: -  Last OV Nov 2021   Today:    Wt Readings from Last 3 Encounters:  10/19/23 213 lb (96.6 kg)  09/21/23 216 lb (98 kg)  08/24/23 217 lb (98.4 kg)    Current Outpatient Medications  Medication Sig Dispense Refill   albuterol (VENTOLIN HFA) 108 (90 Base) MCG/ACT inhaler TAKE 2 PUFFS BY MOUTH EVERY 6 HOURS AS NEEDED FOR WHEEZE OR SHORTNESS OF BREATH 8.5 each 3   Ascorbic Acid (VITAMIN C PO) Take 1 tablet by mouth daily as needed (immune support).     Cyanocobalamin (VITAMIN B-12 PO) Take 1 tablet by mouth in the morning.     ECHINACEA PO Take 760 mg by mouth daily as needed (immune support).     hydrOXYzine (VISTARIL) 25 MG capsule Take 1 capsule (25 mg total) by mouth every 8 (eight) hours as needed. (Patient taking differently: Take 25 mg by mouth in the morning.) 60 capsule 2   levothyroxine (SYNTHROID) 88 MCG tablet Take 88 mcg by mouth.     liothyronine (CYTOMEL) 5 MCG tablet Take 5 mcg by mouth daily before breakfast.     lisinopril (ZESTRIL) 5 MG tablet Take 1 tablet (5 mg total) by mouth daily. **NEEDS TO BE SEEN BEFORE NEXT REFILL** 30 tablet 0   MAGNESIUM GLUCONATE PO Take 120 mg by mouth every evening.     Multiple Vitamins-Minerals (ZINC) LOZG Take 1 lozenge by mouth daily as needed (immune support).     ondansetron (ZOFRAN) 4 MG tablet Take 1 tablet (4 mg total) by mouth daily as needed for nausea or vomiting. 30 tablet 1   oxybutynin (DITROPAN) 5 MG tablet Take 1 tablet (5 mg total) by mouth every 8 (eight) hours as needed for bladder spasms. (Patient not taking: Reported on  10/19/2023) 30 tablet 1   oxyCODONE (ROXICODONE) 5 MG immediate release tablet Take 1 tablet (5 mg total) by mouth every 8 (eight) hours as needed. (Patient not taking: Reported on 10/19/2023) 20 tablet 0   oxyCODONE-acetaminophen (PERCOCET) 5-325 MG tablet Take 1 tablet by mouth every 4 (four) hours as needed for severe pain (pain score 7-10). (Patient not taking: Reported on 10/19/2023) 20 tablet 0   phenazopyridine (PYRIDIUM) 200 MG tablet Take 1 tablet (200 mg total) by mouth 3 (three) times daily as needed (for pain with urination). (Patient not taking: Reported on 10/19/2023) 30 tablet 0   Selenium 200 MCG CAPS Take 200 mcg by mouth in the morning.     tirzepatide (ZEPBOUND) 2.5 MG/0.5ML Pen Inject 2.5 mg into the skin once a week. 3 mL 0   Vitamin D, Ergocalciferol, (DRISDOL) 1.25 MG (50000 UNIT) CAPS capsule Take 1 capsule (50,000 Units total) by mouth every 7 (seven) days. 4 capsule 0   No current facility-administered medications for this visit.    Past Medical History:  Diagnosis Date   Allergy    Anxiety    Asthma    Cancer (HCC) 06/2019   follicular lymphoma   Dysphagia    Follicular lymphoma (HCC)    low-grade follicular lymphoma of  duodenum   GERD (gastroesophageal reflux disease)    Heart murmur    History of kidney stones    Hypertension    Hypothyroidism due to Hashimoto's thyroiditis    Insulin resistance    Kidney stone    PONV (postoperative nausea and vomiting)    Thyroid nodule 2020   Vitamin D deficiency     Past Surgical History:  Procedure Laterality Date   ABDOMINAL HYSTERECTOMY     BIOPSY  05/09/2020   Procedure: BIOPSY;  Surgeon: Corbin Ade, MD;  Location: [facility];  Service: Endoscopy;;  duodenal   CYSTOSCOPY WITH RETROGRADE PYELOGRAM, URETEROSCOPY AND STENT PLACEMENT Right 06/10/2023   Procedure: CYSTOSCOPY WITH RIGHT RETROGRADE PYELOGRAM, URETEROSCOPY AND RIGHT URETERAL STENT PLACEMENT;  Surgeon: Rene Paci, MD;  Location: [facility];  Service: Urology;  Laterality: Right;  45 MINUTES   ESOPHAGOGASTRODUODENOSCOPY N/A 07/13/2019   Procedure: ESOPHAGOGASTRODUODENOSCOPY (EGD);  Surgeon: Corbin Ade, MD; normal esophagus s/p dilation and biopsied, normal stomach, patchy areas of whitish, nodular mucosa in the second portion of the duodenum s/p biopsy.  Duodenal biopsy with low-grade follicular lymphoma.  Esophageal biopsies benign without increased intraepithelial eosinophils.   ESOPHAGOGASTRODUODENOSCOPY N/A 05/09/2020   Procedure: ESOPHAGOGASTRODUODENOSCOPY (EGD);  Surgeon: Corbin Ade, MD;  [facility]: [facility];  Service: Endoscopy;  Laterality: N/A;  7:30pm   KIDNEY STONE SURGERY     MALONEY DILATION N/A 07/13/2019   Procedure: MALONEY DILATION;  Surgeon: Corbin Ade, MD;  Location: [facility];  Service: Endoscopy;  Laterality: N/A;   ROBOT ASSISTED PYELOPLASTY Right 07/31/2023   Procedure: XI ROBOTIC ASSISTED RIGHT PYELOPLASTY;  Surgeon: Rene Paci, MD;  Location: [facility];  Service: Urology;  Laterality: Right;  180 MINUTES   TONSILLECTOMY     Around age 28   WISDOM TOOTH EXTRACTION      Family History  Problem Relation Age of Onset   Thrombocytopenia Mother        itp   Heart disease Mother    Hypertension Mother    Seizures Brother    ADD / ADHD Brother    Colon cancer Paternal Grandfather        in 45s   Diabetes Maternal Grandmother    Stroke Maternal Grandmother    Heart disease Maternal Grandfather    Stroke Maternal Grandfather    Hypothyroidism Paternal Grandmother    Psoriasis Brother    Asthma Daughter    Kidney disease Maternal Uncle    Non-Hodgkin's lymphoma Paternal Uncle    Leukemia Paternal Great-grandmother    Ovarian cancer Maternal Great-grandmother    Breast cancer Other        maternal great aunt   Colon polyps Neg Hx     Allergies as of 10/26/2023 - Review Complete 10/15/2023  Allergen Reaction Noted   Latex Rash 01/03/2016    Social History    Socioeconomic History   Marital status: Married    Spouse name: Not on file   Number of children: 2   Years of education: Not on file   Highest education level: Not on file  Occupational History    Employer: [employer]  Tobacco Use   Smoking status: Never   Smokeless tobacco: Never  Vaping Use   Vaping status: Never Used  Substance and Sexual Activity   Alcohol use: Yes    Comment: occasional- maybe once a month   Drug use: No   Sexual activity: Yes    Birth control/protection: Surgical  Comment: married 14 years  Other Topics Concern   Not on file  Social History Narrative   Not on file   Social Drivers of Health   Financial Resource Strain: Patient Declined (03/06/2023)   Overall Financial Resource Strain (CARDIA)    Difficulty of Paying Living Expenses: Patient declined  Food Insecurity: Patient Declined (07/31/2023)   Hunger Vital Sign    Worried About Running Out of Food in the Last Year: Patient declined    Ran Out of Food in the Last Year: Patient declined  Transportation Needs: No Transportation Needs (07/31/2023)   PRAPARE - Administrator, Civil Service (Medical): No    Lack of Transportation (Non-Medical): No  Physical Activity: Insufficiently Active (03/06/2023)   Exercise Vital Sign    Days of Exercise per Week: 1 day    Minutes of Exercise per Session: 20 min  Stress: Patient Declined (03/06/2023)   [employer] of Occupational Health - Occupational Stress Questionnaire    Feeling of Stress : Patient declined  Social Connections: Unknown (03/06/2023)   Social Connection and Isolation Panel [NHANES]    Frequency of Communication with Friends and Family: Patient declined    Frequency of Social Gatherings with Friends and Family: Patient declined    Attends Religious Services: Patient declined    Database administrator or Organizations: Patient declined    Attends Banker Meetings: Not on file    Marital Status: Patient  declined  Intimate Partner Violence: Not At Risk (07/31/2023)   Humiliation, Afraid, Rape, and Kick questionnaire    Fear of Current or Ex-Partner: No    Emotionally Abused: No    Physically Abused: No    Sexually Abused: No     Review of Systems   Gen: Denies any fever, chills, fatigue, weight loss, lack of appetite.  CV: Denies chest pain, heart palpitations, peripheral edema, syncope.  Resp: Denies shortness of breath at rest or with exertion. Denies wheezing or cough.  GI: see HPI GU : Denies urinary burning, urinary frequency, urinary hesitancy MS: Denies joint pain, muscle weakness, cramps, or limitation of movement.  Derm: Denies rash, itching, dry skin Psych: Denies depression, anxiety, memory loss, and confusion Heme: Denies bruising, bleeding, and enlarged lymph nodes.  Physical Exam   There were no vitals taken for this visit.  General:   Alert and oriented. Pleasant and cooperative. Well-nourished and well-developed.  Head:  Normocephalic and atraumatic. Eyes:  Without icterus, sclera clear and conjunctiva pink.  Ears:  Normal auditory acuity. Mouth:  No deformity or lesions, oral mucosa pink.  Lungs:  Clear to auscultation bilaterally. No wheezes, rales, or rhonchi. No distress.  Heart:  S1, S2 present without murmurs appreciated.  Abdomen:  +BS, soft, non-tender and non-distended. No HSM noted. No guarding or rebound. No masses appreciated.  Rectal:  Deferred  Msk:  Symmetrical without gross deformities. Normal posture. Extremities:  Without edema. Neurologic:  Alert and  oriented x4;  grossly normal neurologically. Skin:  Intact without significant lesions or rashes. Psych:  Alert and cooperative. Normal mood and affect.  Assessment   Betty Hayes is a 38 y.o. female with a history of *** presenting today with      PLAN   ***    Brooke Bonito, MSN, FNP-BC, AGACNP-BC [facility]

## 2023-10-26 ENCOUNTER — Encounter: Payer: Self-pay | Admitting: Gastroenterology

## 2023-10-26 ENCOUNTER — Ambulatory Visit (INDEPENDENT_AMBULATORY_CARE_PROVIDER_SITE_OTHER): Admitting: Gastroenterology

## 2023-10-26 VITALS — BP 114/78 | HR 74 | Temp 97.7°F | Ht 66.0 in | Wt 216.2 lb

## 2023-10-26 DIAGNOSIS — R112 Nausea with vomiting, unspecified: Secondary | ICD-10-CM | POA: Diagnosis not present

## 2023-10-26 DIAGNOSIS — F1090 Alcohol use, unspecified, uncomplicated: Secondary | ICD-10-CM

## 2023-10-26 DIAGNOSIS — C8209 Follicular lymphoma grade I, extranodal and solid organ sites: Secondary | ICD-10-CM

## 2023-10-26 DIAGNOSIS — Z8572 Personal history of non-Hodgkin lymphomas: Secondary | ICD-10-CM | POA: Diagnosis not present

## 2023-10-26 DIAGNOSIS — K219 Gastro-esophageal reflux disease without esophagitis: Secondary | ICD-10-CM | POA: Diagnosis not present

## 2023-10-26 MED ORDER — ESOMEPRAZOLE MAGNESIUM 40 MG PO CPDR: 40.0000 mg | DELAYED_RELEASE_CAPSULE | Freq: Every day | ORAL | 2 refills | Status: DC

## 2023-10-26 NOTE — Patient Instructions (Addendum)
 We are scheduling you for an upper endoscopy in the near future with Dr. Jena Gauss.   Stop the pantoprazole and start taking Nexium 40 mg before supper.   Follow a GERD diet:  Avoid fried, fatty, greasy, spicy, citrus foods. Avoid caffeine and carbonated beverages. Avoid chocolate. Try eating 4-6 small meals a day rather than 3 large meals. Do not eat within 3 hours of laying down. Prop head of bed up on wood or bricks to create a 6 inch incline.  Can continue Zofran as needed and ginger or mint tea for mild symptoms. My preference would be for ginger given peppermint can sometimes worsen reflux.   It was a pleasure to see you today. I want to create trusting relationships with patients. If you receive a survey regarding your visit,  I greatly appreciate you taking time to fill this out on paper or through your MyChart. I value your feedback.  Brooke Bonito, MSN, FNP-BC, AGACNP-BC [facility]

## 2023-10-27 ENCOUNTER — Encounter: Payer: Self-pay | Admitting: Gastroenterology

## 2023-10-28 ENCOUNTER — Encounter: Payer: Self-pay | Admitting: *Deleted

## 2023-10-28 NOTE — Telephone Encounter (Signed)
 Please see response

## 2023-11-05 NOTE — Telephone Encounter (Signed)
 LMOVM to call back for patient to see which date she wants to schedule for

## 2023-11-10 DIAGNOSIS — Q6211 Congenital occlusion of ureteropelvic junction: Secondary | ICD-10-CM | POA: Diagnosis not present

## 2023-11-11 DIAGNOSIS — E038 Other specified hypothyroidism: Secondary | ICD-10-CM | POA: Diagnosis not present

## 2023-11-12 ENCOUNTER — Other Ambulatory Visit (INDEPENDENT_AMBULATORY_CARE_PROVIDER_SITE_OTHER): Payer: Self-pay | Admitting: Adult Health

## 2023-11-13 ENCOUNTER — Encounter (INDEPENDENT_AMBULATORY_CARE_PROVIDER_SITE_OTHER): Payer: Self-pay | Admitting: Adult Health

## 2023-11-14 ENCOUNTER — Other Ambulatory Visit: Payer: Self-pay | Admitting: Family Medicine

## 2023-11-14 DIAGNOSIS — I10 Essential (primary) hypertension: Secondary | ICD-10-CM

## 2023-11-16 ENCOUNTER — Encounter: Payer: Self-pay | Admitting: Family Medicine

## 2023-11-16 NOTE — Telephone Encounter (Signed)
Lmtcb to schedule appt Letter mailed

## 2023-11-16 NOTE — Telephone Encounter (Signed)
 Dettinger pt NTBS 30-d given 10/19/23

## 2023-11-17 ENCOUNTER — Other Ambulatory Visit: Payer: Self-pay | Admitting: Family Medicine

## 2023-11-17 DIAGNOSIS — I10 Essential (primary) hypertension: Secondary | ICD-10-CM

## 2023-11-17 NOTE — Telephone Encounter (Signed)
 Copied from CRM [id number]. Topic: Clinical - Medication Refill >> Nov 17, 2023  3:57 PM Santiya F wrote: Most Recent Primary Care Visit:   Medication: lisinopril  (ZESTRIL ) 5 MG tablet [[id number]]  Has the patient contacted their pharmacy? Yes  (Agent: If yes, when and what did the pharmacy advise?) contact office   Is this the correct pharmacy for this prescription? Yes  This is the patient's preferred pharmacy:  [id number] - [place], [place] - [address] [place] [address] Phone: [phone] Fax: [phone]   Has the prescription been filled recently? Yes  Is the patient out of the medication? No  Has the patient been seen for an appointment in the last year OR does the patient have an upcoming appointment? Yes  Can we respond through MyChart? Yes   Patient has an appointment scheduled for 12/23/23. Patient says she is going to run out of medication before then and wants to know if she can get enough to last until her next appointment.   Agent: Please be advised that Rx refills may take up to 3 business days. We ask that you follow-up with your pharmacy.

## 2023-11-18 ENCOUNTER — Other Ambulatory Visit: Payer: Self-pay | Admitting: Family Medicine

## 2023-11-18 ENCOUNTER — Encounter (INDEPENDENT_AMBULATORY_CARE_PROVIDER_SITE_OTHER): Payer: Self-pay

## 2023-11-18 DIAGNOSIS — I10 Essential (primary) hypertension: Secondary | ICD-10-CM

## 2023-11-19 NOTE — Telephone Encounter (Signed)
 Requesting refill on lisinopril , was refilled on 11/18/23.

## 2023-11-20 DIAGNOSIS — R311 Benign essential microscopic hematuria: Secondary | ICD-10-CM | POA: Diagnosis not present

## 2023-11-24 NOTE — Telephone Encounter (Addendum)
 Spoke with pt. She has been scheduled for 6/9. Aware will send instructions to his mychart  Checked carelon and no PA required

## 2023-11-24 NOTE — Telephone Encounter (Signed)
LMOVM to call back to schedule 

## 2023-11-25 ENCOUNTER — Ambulatory Visit: Admitting: Nurse Practitioner

## 2023-11-30 ENCOUNTER — Encounter (INDEPENDENT_AMBULATORY_CARE_PROVIDER_SITE_OTHER): Payer: Self-pay | Admitting: Adult Health

## 2023-11-30 ENCOUNTER — Ambulatory Visit (INDEPENDENT_AMBULATORY_CARE_PROVIDER_SITE_OTHER): Admitting: Adult Health

## 2023-11-30 VITALS — BP 115/74 | HR 60 | Temp 98.2°F | Ht 66.0 in | Wt 210.0 lb

## 2023-11-30 DIAGNOSIS — Z6833 Body mass index (BMI) 33.0-33.9, adult: Secondary | ICD-10-CM

## 2023-11-30 DIAGNOSIS — E88819 Insulin resistance, unspecified: Secondary | ICD-10-CM | POA: Diagnosis not present

## 2023-11-30 DIAGNOSIS — Z6835 Body mass index (BMI) 35.0-35.9, adult: Secondary | ICD-10-CM

## 2023-11-30 DIAGNOSIS — E66812 Obesity, class 2: Secondary | ICD-10-CM

## 2023-11-30 DIAGNOSIS — E559 Vitamin D deficiency, unspecified: Secondary | ICD-10-CM

## 2023-11-30 DIAGNOSIS — R110 Nausea: Secondary | ICD-10-CM | POA: Diagnosis not present

## 2023-11-30 DIAGNOSIS — I10 Essential (primary) hypertension: Secondary | ICD-10-CM

## 2023-11-30 DIAGNOSIS — E669 Obesity, unspecified: Secondary | ICD-10-CM

## 2023-11-30 MED ORDER — VITAMIN D (ERGOCALCIFEROL) 1.25 MG (50000 UNIT) PO CAPS: 50000.0000 [IU] | ORAL_CAPSULE | ORAL | 0 refills | Status: DC

## 2023-11-30 NOTE — Progress Notes (Signed)
 WEIGHT SUMMARY AND BIOMETRICS  Vitals Temp: 98.2 F (36.8 C) BP: 115/74 Pulse Rate: 60 SpO2: 96 %   Anthropometric Measurements Height: 5\' 6"  (1.676 m) Weight: 210 lb (95.3 kg) BMI (Calculated): 33.91 Weight at Last Visit: 213 lb Weight Lost Since Last Visit: 3 lb Weight Gained Since Last Visit: 0 Starting Weight: 220 lb Total Weight Loss (lbs): 10 lb (4.536 kg) Peak Weight: 213  lb   Body Composition  Body Fat %: 41 % Fat Mass (lbs): 86.2 lbs Muscle Mass (lbs): 117.8 lbs Total Body Water  (lbs): 80.6 lbs Visceral Fat Rating : 9   Other Clinical Data Fasting: no Labs: no Today's Visit #: 10 Starting Date: 04/14/23    Chief Complaint:   OBESITY Betty Hayes is here to discuss her progress with her obesity treatment plan.  She is on the the Category 2 Plan and states she is following her eating plan approximately 80 % of the time.  She states she is exercising: None  Interim History:  Her insurance will not cover Zepbound  Her insurance required her to establish with Yvonna Herder for weight management and started her on Wegovy  [name] Provider is Nurse Practitioner and visits are virtual. She had her first injection of Wegovy  0.25mg  on 11/25/2023 Her next OV with [name] on 12/22/2023  Reviewed Bioimpedance Results with pt: Muscle Mass: +1.4 lbs Adipose Mass: -4.8 lbs  Subjective:   1. Insulin  resistance  Latest Reference Range & Units 08/24/23 12:52  Glucose 70 - 99 mg/dL 89  Hemoglobin G9F 4.8 - 5.6 % 5.5  Est. average glucose Bld gHb Est-mCnc mg/dL 621  INSULIN  2.6 - 24.9 uIU/mL 16.3   Her insurance will not cover Zepbound  Her insurance required her to establish with Yvonna Herder for weight management and started her on Wegovy  She had her first injection of Wegovy  0.25mg  on 11/25/2023 Her next OV with [name] on 12/22/2023  2. Essential hypertension BP stable at goal at OV She denies CP with exertion She is on  lisinopril  (ZESTRIL ) 5 MG tablet   3. Vitamin D  deficiency   Latest Reference Range & Units 08/24/23 12:52  Vitamin D , 25-Hydroxy 30.0 - 100.0 ng/mL 29.8 (L)  (L): Data is abnormally low  Vit D Level is well below goal of 50-70 She is on weekly Ergocalciferol - denies N/V/Muscle Weakness  4. Nausea without vomiting At last [facility] OV- she was referred to Gastroenterlogy for N/V She started on Prilosec and GI upset has resolved She ha EDG scheduled on 12/28/2023  Assessment/Plan:   1. Insulin  resistance Limit sugar/simple CHO Increase lean protein intake Increase regular cardiovascular exercise Continue Wegovy  therapy per [facility]  2. Essential hypertension (Primary) Limit Na+ Remain well hydrated with water  and increase regular cardiovascular exercise  3. Vitamin D  deficiency Refill Vitamin D , Ergocalciferol , (DRISDOL ) 1.25 MG (50000 UNIT) CAPS capsule Take 1 capsule (50,000 Units total) by mouth every 7 (seven) days. Dispense: 4 capsule, Refills: 0 ordered   4. Nausea without vomiting Continue to avoid known trigger foods Do not overeat F/u with GI as directed  5. BMI 35.0-35.9,adult current BMI 33.9  Betty Hayes is currently in the action stage of change. As such, her goal is to continue with weight loss efforts. She has agreed to the Category 2 Plan.   Exercise goals: All adults should avoid inactivity. Some physical activity is better than none, and adults who participate in any amount of physical activity gain some health benefits. Adults should also include muscle-strengthening activities that involve all major  muscle groups on 2 or more days a week.  Behavioral modification strategies: increasing lean protein intake, decreasing simple carbohydrates, increasing vegetables, increasing water  intake, no skipping meals, meal planning and cooking strategies, keeping healthy foods in the home, ways to avoid boredom eating, and planning for success.  Ashla has agreed to follow-up with our clinic in 4 weeks. She was informed of the importance  of frequent follow-up visits to maximize her success with intensive lifestyle modifications for her multiple health conditions.   Objective:   Blood pressure 115/74, pulse 60, temperature 98.2 F (36.8 C), height 5\' 6"  (1.676 m), weight 210 lb (95.3 kg), SpO2 96%. Body mass index is 33.89 kg/m.  General: Cooperative, alert, well developed, in no acute distress. HEENT: Conjunctivae and lids unremarkable. Cardiovascular: Regular rhythm.  Lungs: Normal work of breathing. Neurologic: No focal deficits.   Lab Results  Component Value Date   CREATININE 0.68 08/24/2023   BUN 11 08/24/2023   NA 142 08/24/2023   K 4.7 08/24/2023   CL 106 08/24/2023   CO2 21 08/24/2023   Lab Results  Component Value Date   ALT 18 08/10/2023   AST 19 08/10/2023   ALKPHOS 75 08/10/2023   BILITOT 0.5 08/10/2023   Lab Results  Component Value Date   HGBA1C 5.5 08/24/2023   HGBA1C 5.5 03/31/2023   HGBA1C 5.0 08/15/2016   Lab Results  Component Value Date   INSULIN  16.3 08/24/2023   INSULIN  18.0 03/31/2023   Lab Results  Component Value Date   TSH 0.400 (L) 03/31/2023   Lab Results  Component Value Date   CHOL 177 03/31/2023   HDL 63 03/31/2023   LDLCALC 102 (H) 03/31/2023   TRIG 61 03/31/2023   CHOLHDL 2.7 04/07/2022   Lab Results  Component Value Date   VD25OH 29.8 (L) 08/24/2023   VD25OH 30.3 03/31/2023   Lab Results  Component Value Date   WBC 10.2 08/10/2023   HGB 13.0 08/10/2023   HCT 40.7 08/10/2023   MCV 82.2 08/10/2023   PLT 294 08/10/2023   No results found for: "IRON", "TIBC", "FERRITIN"  Attestation Statements:   Reviewed by clinician on day of visit: allergies, medications, problem list, medical history, surgical history, family history, social history, and previous encounter notes.  I have reviewed the above documentation for accuracy and completeness, and I agree with the above. -  Catlynn Grondahl d. Airyanna Dipalma, NP-C

## 2023-12-01 DIAGNOSIS — E038 Other specified hypothyroidism: Secondary | ICD-10-CM | POA: Diagnosis not present

## 2023-12-09 ENCOUNTER — Telehealth (INDEPENDENT_AMBULATORY_CARE_PROVIDER_SITE_OTHER): Payer: Self-pay | Admitting: *Deleted

## 2023-12-09 NOTE — Telephone Encounter (Signed)
 Error message

## 2023-12-17 ENCOUNTER — Other Ambulatory Visit: Payer: Self-pay | Admitting: Family Medicine

## 2023-12-17 DIAGNOSIS — I10 Essential (primary) hypertension: Secondary | ICD-10-CM

## 2023-12-23 ENCOUNTER — Encounter: Payer: Self-pay | Admitting: Family Medicine

## 2023-12-23 ENCOUNTER — Ambulatory Visit (INDEPENDENT_AMBULATORY_CARE_PROVIDER_SITE_OTHER): Admitting: Family Medicine

## 2023-12-23 VITALS — BP 119/86 | HR 96 | Ht 66.0 in | Wt 209.0 lb

## 2023-12-23 DIAGNOSIS — I10 Essential (primary) hypertension: Secondary | ICD-10-CM | POA: Diagnosis not present

## 2023-12-23 DIAGNOSIS — E063 Autoimmune thyroiditis: Secondary | ICD-10-CM | POA: Diagnosis not present

## 2023-12-23 DIAGNOSIS — F419 Anxiety disorder, unspecified: Secondary | ICD-10-CM | POA: Diagnosis not present

## 2023-12-23 DIAGNOSIS — K219 Gastro-esophageal reflux disease without esophagitis: Secondary | ICD-10-CM | POA: Diagnosis not present

## 2023-12-23 MED ORDER — HYDROXYZINE PAMOATE 25 MG PO CAPS: 25.0000 mg | ORAL_CAPSULE | Freq: Three times a day (TID) | ORAL | 2 refills | Status: AC | PRN

## 2023-12-23 NOTE — Progress Notes (Signed)
 BP 119/86   Pulse 96   Ht 5\' 6"  (1.676 m)   Wt 209 lb (94.8 kg)   SpO2 95%   BMI 33.73 kg/m    Subjective:   Patient ID: Betty Hayes, female    DOB: 13-May-1986, 38 y.o.   MRN: [id number]  HPI: Betty Hayes is a 38 y.o. female presenting on 12/23/2023 for Medical Management of Chronic Issues and Hypothyroidism   HPI Hypothyroidism recheck Patient is coming in for thyroid  recheck today as well. They deny any issues with hair changes or heat or cold problems or diarrhea or constipation. They deny any chest pain or palpitations. They are currently on levothyroxine  88 micrograms, liothyronine  5 mcg daily  GERD Patient is currently on Nexium  as needed.  She denies any major symptoms or abdominal pain or belching or burping. She denies any blood in her stool or lightheadedness or dizziness.   Hypertension Patient is currently on lisinopril , and their blood pressure today is 119/86. Patient denies any lightheadedness or dizziness. Patient denies headaches, blurred vision, chest pains, shortness of breath, or weakness. Denies any side effects from medication and is content with current medication.   Obesity and weight management Patient is still seeing a clinic for obesity and weight management and she is also on Wegovy  now and it has caused some constipation but other than that she seems to be doing okay on it.  Relevant past medical, surgical, family and social history reviewed and updated as indicated. Interim medical history since our last visit reviewed. Allergies and medications reviewed and updated.  Review of Systems  Constitutional:  Negative for chills and fever.  HENT:  Negative for congestion, ear discharge and ear pain.   Eyes:  Negative for redness and visual disturbance.  Respiratory:  Negative for chest tightness and shortness of breath.   Cardiovascular:  Negative for chest pain and leg swelling.  Gastrointestinal:  Positive for constipation.  Genitourinary:   Negative for difficulty urinating and dysuria.  Musculoskeletal:  Negative for back pain and gait problem.  Skin:  Negative for rash.  Neurological:  Negative for dizziness, light-headedness and headaches.  Psychiatric/Behavioral:  Negative for agitation and behavioral problems.   All other systems reviewed and are negative.   Per HPI unless specifically indicated above   Allergies as of 12/23/2023       Reactions   Latex Rash        Medication List        Accurate as of December 23, 2023  3:35 PM. If you have any questions, ask your nurse or doctor.          STOP taking these medications    oxybutynin  5 MG tablet Commonly known as: DITROPAN  Stopped by: Lucio Sabin Deanndra Kirley   oxyCODONE  5 MG immediate release tablet Commonly known as: Roxicodone  Stopped by: Lucio Sabin Franciso Dierks   oxyCODONE -acetaminophen  5-325 MG tablet Commonly known as: Percocet Stopped by: Lucio Sabin Reveca Desmarais   phenazopyridine  200 MG tablet Commonly known as: Pyridium  Stopped by: Lucio Sabin Zyaire Mccleod       TAKE these medications    albuterol  108 (90 Base) MCG/ACT inhaler Commonly known as: VENTOLIN  HFA TAKE 2 PUFFS BY MOUTH EVERY 6 HOURS AS NEEDED FOR WHEEZE OR SHORTNESS OF BREATH   ECHINACEA PO Take 760 mg by mouth daily as needed (immune support).   esomeprazole  40 MG capsule Commonly known as: NexIUM  Take 1 capsule (40 mg total) by mouth daily before supper.   hydrOXYzine  25 MG  capsule Commonly known as: VISTARIL  Take 1 capsule (25 mg total) by mouth every 8 (eight) hours as needed.   liothyronine  5 MCG tablet Commonly known as: CYTOMEL  Take 5 mcg by mouth daily.   lisinopril  5 MG tablet Commonly known as: ZESTRIL  TAKE 1 TABLET (5 MG TOTAL) BY MOUTH DAILY.   MAGNESIUM  GLUCONATE PO Take 120 mg by mouth every evening.   ondansetron  4 MG tablet Commonly known as: Zofran  Take 1 tablet (4 mg total) by mouth daily as needed for nausea or vomiting.   Selenium 200 MCG Caps Take 200 mcg  by mouth in the morning.   Synthroid  88 MCG tablet Generic drug: levothyroxine  Take 88 mcg by mouth.   VITAMIN B-12 PO Take 1 tablet by mouth in the morning.   VITAMIN C PO Take 1 tablet by mouth daily as needed (immune support).   Vitamin D  (Ergocalciferol ) 1.25 MG (50000 UNIT) Caps capsule Commonly known as: DRISDOL  Take 1 capsule (50,000 Units total) by mouth every 7 (seven) days.   Wegovy  0.25 MG/0.5ML Soaj Generic drug: Semaglutide -Weight Management Inject 0.25 mg into the skin once a week.   Zinc Lozg Take 1 lozenge by mouth daily as needed (immune support).         Objective:   BP 119/86   Pulse 96   Ht 5\' 6"  (1.676 m)   Wt 209 lb (94.8 kg)   SpO2 95%   BMI 33.73 kg/m   Wt Readings from Last 3 Encounters:  12/23/23 209 lb (94.8 kg)  11/30/23 210 lb (95.3 kg)  10/26/23 216 lb 3.2 oz (98.1 kg)    Physical Exam Vitals and nursing note reviewed.  Constitutional:      General: She is not in acute distress.    Appearance: She is well-developed. She is not diaphoretic.  Eyes:     Conjunctiva/sclera: Conjunctivae normal.  Cardiovascular:     Rate and Rhythm: Normal rate and regular rhythm.     Heart sounds: Normal heart sounds. No murmur heard. Pulmonary:     Effort: Pulmonary effort is normal. No respiratory distress.     Breath sounds: Normal breath sounds. No wheezing.  Musculoskeletal:        General: No swelling.  Skin:    General: Skin is warm and dry.     Findings: No rash.  Neurological:     Mental Status: She is alert and oriented to person, place, and time.     Coordination: Coordination normal.  Psychiatric:        Behavior: Behavior normal.       Assessment & Plan:   Problem List Items Addressed This Visit       Cardiovascular and Mediastinum   HTN, goal below 130/80 - Primary   Relevant Orders   CMP14+EGFR   Lipid panel     Digestive   GERD (gastroesophageal reflux disease)   Relevant Orders   CBC with  Differential/Platelet     Endocrine   Hypothyroidism   Hashimoto's thyroiditis   Relevant Orders   CMP14+EGFR   Other Visit Diagnoses       Anxiety       Relevant Medications   hydrOXYzine  (VISTARIL ) 25 MG capsule       Continue current medicine, no changes, will check blood work today.  She already got thyroid  checked. Follow up plan: Return in about 6 months (around 06/23/2024), or if symptoms worsen or fail to improve, for Physical exam  and hypertension and thyroid  recheck.  Counseling  provided for all of the vaccine components Orders Placed This Encounter  Procedures   CBC with Differential/Platelet   CMP14+EGFR   Lipid panel    Jolyne Needs, MD [facility] 12/23/2023, 3:35 PM

## 2023-12-24 LAB — CMP14+EGFR
ALT: 15 IU/L (ref 0–32)
AST: 17 IU/L (ref 0–40)
Albumin: 4.8 g/dL (ref 3.9–4.9)
Alkaline Phosphatase: 90 IU/L (ref 44–121)
BUN/Creatinine Ratio: 16 (ref 9–23)
BUN: 11 mg/dL (ref 6–24)
Bilirubin Total: 0.8 mg/dL (ref 0.0–1.2)
CO2: 20 mmol/L (ref 20–29)
Calcium: 10.1 mg/dL (ref 8.7–10.2)
Chloride: 100 mmol/L (ref 96–106)
Creatinine, Ser: 0.69 mg/dL (ref 0.57–1.00)
Globulin, Total: 2.7 g/dL (ref 1.5–4.5)
Glucose: 85 mg/dL (ref 70–99)
Potassium: 4.3 mmol/L (ref 3.5–5.2)
Sodium: 137 mmol/L (ref 134–144)
Total Protein: 7.5 g/dL (ref 6.0–8.5)
eGFR: 112 mL/min/{1.73_m2} (ref >59)

## 2023-12-24 LAB — CBC WITH DIFFERENTIAL/PLATELET
Basophils Absolute: 0.1 10*3/uL (ref 0.0–0.2)
Basos: 1 %
EOS (ABSOLUTE): 0.2 10*3/uL (ref 0.0–0.4)
Eos: 2 %
Hematocrit: 42.3 % (ref 34.0–46.6)
Hemoglobin: 13.9 g/dL (ref 11.1–15.9)
Immature Grans (Abs): 0 10*3/uL (ref 0.0–0.1)
Immature Granulocytes: 0 %
Lymphocytes Absolute: 3.4 10*3/uL — ABNORMAL HIGH (ref 0.7–3.1)
Lymphs: 37 %
MCH: 28 pg (ref 26.6–33.0)
MCHC: 32.9 g/dL (ref 31.5–35.7)
MCV: 85 fL (ref 79–97)
Monocytes Absolute: 0.6 10*3/uL (ref 0.1–0.9)
Monocytes: 7 %
Neutrophils Absolute: 5 10*3/uL (ref 1.4–7.0)
Neutrophils: 53 %
Platelets: 243 10*3/uL (ref 150–450)
RBC: 4.96 x10E6/uL (ref 3.77–5.28)
RDW: 12.8 % (ref 11.7–15.4)
WBC: 9.3 10*3/uL (ref 3.4–10.8)

## 2023-12-24 LAB — LIPID PANEL
Chol/HDL Ratio: 3.6 ratio (ref 0.0–4.4)
Cholesterol, Total: 186 mg/dL (ref 100–199)
HDL: 51 mg/dL (ref >39)
LDL Chol Calc (NIH): 114 mg/dL — ABNORMAL HIGH (ref 0–99)
Triglycerides: 118 mg/dL (ref 0–149)
VLDL Cholesterol Cal: 21 mg/dL (ref 5–40)

## 2023-12-28 ENCOUNTER — Telehealth: Payer: Self-pay

## 2023-12-28 ENCOUNTER — Encounter (HOSPITAL_COMMUNITY)
Admission: RE | Disposition: A | Discharge status: Home / Self Care | Payer: Self-pay | Source: Home / Self Care | Attending: Internal Medicine

## 2023-12-28 ENCOUNTER — Encounter (HOSPITAL_COMMUNITY): Payer: Self-pay | Admitting: Internal Medicine

## 2023-12-28 ENCOUNTER — Ambulatory Visit (HOSPITAL_COMMUNITY): Admitting: Anesthesiology

## 2023-12-28 ENCOUNTER — Other Ambulatory Visit: Payer: Self-pay

## 2023-12-28 ENCOUNTER — Ambulatory Visit (HOSPITAL_COMMUNITY)
Admission: RE | Admit: 2023-12-28 | Discharge: 2023-12-28 | Disposition: A | Discharge status: Home / Self Care | Attending: Internal Medicine | Admitting: Internal Medicine

## 2023-12-28 DIAGNOSIS — Z7984 Long term (current) use of oral hypoglycemic drugs: Secondary | ICD-10-CM | POA: Diagnosis not present

## 2023-12-28 DIAGNOSIS — K209 Esophagitis, unspecified without bleeding: Secondary | ICD-10-CM | POA: Insufficient documentation

## 2023-12-28 DIAGNOSIS — E039 Hypothyroidism, unspecified: Secondary | ICD-10-CM | POA: Diagnosis not present

## 2023-12-28 DIAGNOSIS — Z8572 Personal history of non-Hodgkin lymphomas: Secondary | ICD-10-CM | POA: Diagnosis not present

## 2023-12-28 DIAGNOSIS — R112 Nausea with vomiting, unspecified: Secondary | ICD-10-CM | POA: Insufficient documentation

## 2023-12-28 DIAGNOSIS — Z79899 Other long term (current) drug therapy: Secondary | ICD-10-CM | POA: Insufficient documentation

## 2023-12-28 DIAGNOSIS — I10 Essential (primary) hypertension: Secondary | ICD-10-CM | POA: Insufficient documentation

## 2023-12-28 DIAGNOSIS — Z79890 Hormone replacement therapy (postmenopausal): Secondary | ICD-10-CM | POA: Diagnosis not present

## 2023-12-28 DIAGNOSIS — K3189 Other diseases of stomach and duodenum: Secondary | ICD-10-CM | POA: Diagnosis not present

## 2023-12-28 DIAGNOSIS — E119 Type 2 diabetes mellitus without complications: Secondary | ICD-10-CM | POA: Insufficient documentation

## 2023-12-28 DIAGNOSIS — K2100 Gastro-esophageal reflux disease with esophagitis, without bleeding: Secondary | ICD-10-CM | POA: Diagnosis not present

## 2023-12-28 HISTORY — PX: ESOPHAGOGASTRODUODENOSCOPY: SHX5428

## 2023-12-28 SURGERY — EGD (ESOPHAGOGASTRODUODENOSCOPY)
Anesthesia: General

## 2023-12-28 MED ORDER — GLYCOPYRROLATE PF 0.2 MG/ML IJ SOSY
PREFILLED_SYRINGE | INTRAMUSCULAR | Status: DC | PRN
  Administered 2023-12-28: .2 mg via INTRAVENOUS

## 2023-12-28 MED ORDER — LIDOCAINE 2% (20 MG/ML) 5 ML SYRINGE
INTRAMUSCULAR | Status: DC | PRN
  Administered 2023-12-28: 100 mg via INTRAVENOUS

## 2023-12-28 MED ORDER — PROPOFOL 10 MG/ML IV BOLUS
INTRAVENOUS | Status: DC | PRN
  Administered 2023-12-28: 100 mg via INTRAVENOUS

## 2023-12-28 MED ORDER — PROPOFOL 500 MG/50ML IV EMUL
INTRAVENOUS | Status: DC | PRN
  Administered 2023-12-28: 200 ug/kg/min via INTRAVENOUS

## 2023-12-28 MED ORDER — ESOMEPRAZOLE MAGNESIUM 40 MG PO CPDR: 40.0000 mg | DELAYED_RELEASE_CAPSULE | Freq: Two times a day (BID) | ORAL | 11 refills | Status: AC

## 2023-12-28 MED ORDER — ONDANSETRON HCL 4 MG/2ML IJ SOLN
INTRAMUSCULAR | Status: DC | PRN
Start: 2023-12-28 — End: 2023-12-28
  Administered 2023-12-28: 4 mg via INTRAVENOUS

## 2023-12-28 MED ORDER — LACTATED RINGERS IV SOLN: INTRAVENOUS | Status: DC

## 2023-12-28 NOTE — Anesthesia Preprocedure Evaluation (Signed)
 Anesthesia Evaluation  Patient identified by MRN, date of birth, ID band Patient awake    Reviewed: Allergy & Precautions, H&P , NPO status , Patient's Chart, lab work & pertinent test results, reviewed documented beta blocker date and time   History of Anesthesia Complications (+) PONV and history of anesthetic complications  Airway Mallampati: II  TM Distance: >3 FB Neck ROM: full    Dental no notable dental hx. (+) Teeth Intact, Dental Advisory Given   Pulmonary asthma    Pulmonary exam normal breath sounds clear to auscultation       Cardiovascular Exercise Tolerance: Good hypertension, Pt. on medications Normal cardiovascular exam Rhythm:regular Rate:Normal     Neuro/Psych   Anxiety     negative neurological ROS  negative psych ROS   GI/Hepatic negative GI ROS, Neg liver ROS,GERD  Medicated,,low-grade follicular lymphoma of duodenum   Endo/Other  Type 2, Oral Hypoglycemic AgentsHypothyroidism  Obesity   Renal/GU Renal disease  negative genitourinary   Musculoskeletal   Abdominal   Peds  Hematology negative hematology ROS (+)   Anesthesia Other Findings Day of surgery medications reviewed with the patient.  Reproductive/Obstetrics negative OB ROS                             Anesthesia Physical Anesthesia Plan  ASA: 2  Anesthesia Plan: General   Post-op Pain Management: Minimal or no pain anticipated   Induction: Intravenous  PONV Risk Score and Plan: Propofol  infusion  Airway Management Planned: Nasal Cannula and Natural Airway  Additional Equipment:   Intra-op Plan:   Post-operative Plan:   Informed Consent: I have reviewed the patients History and Physical, chart, labs and discussed the procedure including the risks, benefits and alternatives for the proposed anesthesia with the patient or authorized representative who has indicated his/her understanding and  acceptance.     Dental Advisory Given  Plan Discussed with: CRNA  Anesthesia Plan Comments:        Anesthesia Quick Evaluation

## 2023-12-28 NOTE — Discharge Instructions (Addendum)
 EGD Discharge instructions Please read the instructions outlined below and refer to this sheet in the next few weeks. These discharge instructions provide you with general information on caring for yourself after you leave the hospital. Your doctor may also give you specific instructions. While your treatment has been planned according to the most current medical practices available, unavoidable complications occasionally occur. If you have any problems or questions after discharge, please call your doctor. ACTIVITY You may resume your regular activity but move at a slower pace for the next 24 hours.  Take frequent rest periods for the next 24 hours.  Walking will help expel (get rid of) the air and reduce the bloated feeling in your abdomen.  No driving for 24 hours (because of the anesthesia (medicine) used during the test).  You may shower.  Do not sign any important legal documents or operate any machinery for 24 hours (because of the anesthesia used during the test).  NUTRITION Drink plenty of fluids.  You may resume your normal diet.  Begin with a light meal and progress to your normal diet.  Avoid alcoholic beverages for 24 hours or as instructed by your caregiver.  MEDICATIONS You may resume your normal medications unless your caregiver tells you otherwise.  WHAT YOU CAN EXPECT TODAY You may experience abdominal discomfort such as a feeling of fullness or "gas" pains.  FOLLOW-UP Your doctor will discuss the results of your test with you.  SEEK IMMEDIATE MEDICAL ATTENTION IF ANY OF THE FOLLOWING OCCUR: Excessive nausea (feeling sick to your stomach) and/or vomiting.  Severe abdominal pain and distention (swelling).  Trouble swallowing.  Temperature over 101 F (37.8 C).  Rectal bleeding or vomiting of blood.      your small intestine look good.  I did take biopsies.  You have acid burns in your esophagus which goes along with your intermittent nausea that you have been  experiencing  We will increase your Nexium  to 40 mg twice daily 30 minutes before breakfast and supper.  New prescription being sent to your pharmacy from the office  Office visit with us  in 6 weeks  Further recommendations to follow once I get the biopsy report back

## 2023-12-28 NOTE — Telephone Encounter (Signed)
Rx was sent to pharmacy on file.  

## 2023-12-28 NOTE — H&P (Signed)
 @LOGO @   Primary Care Physician:  Dettinger, Lucio Sabin, MD Primary Gastroenterologist:  Dr. Riley Cheadle  Pre-Procedure History & Physical: HPI:  Betty Hayes is a 39 y.o. female here for  further evaluation of recurrent  intermittent nausea vomiting.  History low-grade duodenal follicular cell lymphoma.    Denies dysphagia.  Past Medical History:  Diagnosis Date   Allergy    Anxiety    Asthma    Cancer (HCC) 06/2019   follicular lymphoma   Dysphagia    Follicular lymphoma (HCC)    low-grade follicular lymphoma of duodenum   GERD (gastroesophageal reflux disease)    Heart murmur    History of kidney stones    Hypertension    Hypothyroidism due to Hashimoto's thyroiditis    Insulin  resistance    Kidney stone    PONV (postoperative nausea and vomiting)    Thyroid  nodule 2020   Vitamin D  deficiency     Past Surgical History:  Procedure Laterality Date   ABDOMINAL HYSTERECTOMY     BIOPSY  05/09/2020   Procedure: BIOPSY;  Surgeon: Suzette Espy, MD;  Location: [facility];  Service: Endoscopy;;  duodenal   CYSTOSCOPY WITH RETROGRADE PYELOGRAM, URETEROSCOPY AND STENT PLACEMENT Right 06/10/2023   Procedure: CYSTOSCOPY WITH RIGHT RETROGRADE PYELOGRAM, URETEROSCOPY AND RIGHT URETERAL STENT PLACEMENT;  Surgeon: Adelbert Homans, MD;  Location: [facility];  Service: Urology;  Laterality: Right;  45 MINUTES   ESOPHAGOGASTRODUODENOSCOPY N/A 07/13/2019   Procedure: ESOPHAGOGASTRODUODENOSCOPY (EGD);  Surgeon: Suzette Espy, MD; normal esophagus s/p dilation and biopsied, normal stomach, patchy areas of whitish, nodular mucosa in the second portion of the duodenum s/p biopsy.  Duodenal biopsy with low-grade follicular lymphoma.  Esophageal biopsies benign without increased intraepithelial eosinophils.   ESOPHAGOGASTRODUODENOSCOPY N/A 05/09/2020   Procedure: ESOPHAGOGASTRODUODENOSCOPY (EGD);  Surgeon: Suzette Espy, MD;  Location: [facility];  Service: Endoscopy;  Laterality: N/A;   7:30pm   KIDNEY STONE SURGERY     MALONEY DILATION N/A 07/13/2019   Procedure: MALONEY DILATION;  Surgeon: Suzette Espy, MD;  Location: [facility];  Service: Endoscopy;  Laterality: N/A;   ROBOT ASSISTED PYELOPLASTY Right 07/31/2023   Procedure: XI ROBOTIC ASSISTED RIGHT PYELOPLASTY;  Surgeon: Adelbert Homans, MD;  Location: [facility];  Service: Urology;  Laterality: Right;  180 MINUTES   TONSILLECTOMY     Around age 85   WISDOM TOOTH EXTRACTION      Prior to Admission medications   Medication Sig Start Date End Date Taking? Authorizing Provider  Ascorbic Acid (VITAMIN C PO) Take 1 tablet by mouth daily as needed (immune support).   Yes [provider]  Cyanocobalamin  (VITAMIN B-12 PO) Take 1 tablet by mouth in the morning.   Yes [provider]  ECHINACEA PO Take 760 mg by mouth daily as needed (immune support).   Yes [provider]  esomeprazole  (NEXIUM ) 40 MG capsule Take 1 capsule (40 mg total) by mouth daily before supper. 10/26/23  Yes Mahon, Martine Sleek, NP  hydrOXYzine  (VISTARIL ) 25 MG capsule Take 1 capsule (25 mg total) by mouth every 8 (eight) hours as needed. 12/23/23  Yes Dettinger, Lucio Sabin, MD  levothyroxine  (SYNTHROID ) 88 MCG tablet Take 88 mcg by mouth. 11/11/23  Yes [provider]  liothyronine  (CYTOMEL ) 5 MCG tablet Take 5 mcg by mouth daily. 11/11/23  Yes [provider]  lisinopril  (ZESTRIL ) 5 MG tablet TAKE 1 TABLET (5 MG TOTAL) BY MOUTH DAILY. 12/17/23  Yes Dettinger, Lucio Sabin,  MD  MAGNESIUM  GLUCONATE PO Take 120 mg by mouth every evening.   Yes [provider]  Multiple Vitamins-Minerals (ZINC) LOZG Take 1 lozenge by mouth daily as needed (immune support).   Yes [provider]  ondansetron  (ZOFRAN ) 4 MG tablet Take 1 tablet (4 mg total) by mouth daily as needed for nausea or vomiting. 07/31/23 07/30/24 Yes Adelbert Homans, MD  Selenium 200 MCG CAPS Take 200 mcg by mouth in the morning.   Yes  [provider]  Vitamin D , Ergocalciferol , (DRISDOL ) 1.25 MG (50000 UNIT) CAPS capsule Take 1 capsule (50,000 Units total) by mouth every 7 (seven) days. 11/30/23  Yes Danford, Katy D, NP  albuterol  (VENTOLIN  HFA) 108 (90 Base) MCG/ACT inhaler TAKE 2 PUFFS BY MOUTH EVERY 6 HOURS AS NEEDED FOR WHEEZE OR SHORTNESS OF BREATH 11/03/22   Dettinger, Lucio Sabin, MD  WEGOVY  0.25 MG/0.5ML SOAJ Inject 0.25 mg into the skin once a week. 11/24/23   [provider]    Allergies as of 11/24/2023 - Review Complete 10/26/2023  Allergen Reaction Noted   Latex Rash 01/03/2016    Family History  Problem Relation Age of Onset   Thrombocytopenia Mother        itp   Heart disease Mother    Hypertension Mother    Seizures Brother    ADD / ADHD Brother    Colon cancer Paternal Grandfather        in 44s   Diabetes Maternal Grandmother    Stroke Maternal Grandmother    Heart disease Maternal Grandfather    Stroke Maternal Grandfather    Hypothyroidism Paternal Grandmother    Psoriasis Brother    Asthma Daughter    Kidney disease Maternal Uncle    Non-Hodgkin's lymphoma Paternal Uncle    Leukemia Paternal Great-grandmother    Ovarian cancer Maternal Great-grandmother    Breast cancer Other        maternal great aunt   Colon polyps Neg Hx     Social History   Socioeconomic History   Marital status: Married    Spouse name: Not on file   Number of children: 2   Years of education: Not on file   Highest education level: Not on file  Occupational History    Employer: [employer]  Tobacco Use   Smoking status: Never   Smokeless tobacco: Never  Vaping Use   Vaping status: Never Used  Substance and Sexual Activity   Alcohol use: Yes    Comment: occasional- maybe once a month   Drug use: No   Sexual activity: Yes    Birth control/protection: Surgical    Comment: married 14 years  Other Topics Concern   Not on file  Social History Narrative   Not on file   Social Drivers  of Health   Financial Resource Strain: Patient Declined (03/06/2023)   Overall Financial Resource Strain (CARDIA)    Difficulty of Paying Living Expenses: Patient declined  Food Insecurity: Patient Declined (07/31/2023)   Hunger Vital Sign    Worried About Running Out of Food in the Last Year: Patient declined    Ran Out of Food in the Last Year: Patient declined  Transportation Needs: No Transportation Needs (07/31/2023)   PRAPARE - Administrator, Civil Service (Medical): No    Lack of Transportation (Non-Medical): No  Physical Activity: Insufficiently Active (03/06/2023)   Exercise Vital Sign    Days of Exercise per Week: 1 day    Minutes of  Exercise per Session: 20 min  Stress: Patient Declined (03/06/2023)   [employer] of Occupational Health - Occupational Stress Questionnaire    Feeling of Stress : Patient declined  Social Connections: Unknown (03/06/2023)   Social Connection and Isolation Panel [NHANES]    Frequency of Communication with Friends and Family: Patient declined    Frequency of Social Gatherings with Friends and Family: Patient declined    Attends Religious Services: Patient declined    Database administrator or Organizations: Patient declined    Attends Banker Meetings: Not on file    Marital Status: Patient declined  Intimate Partner Violence: Not At Risk (07/31/2023)   Humiliation, Afraid, Rape, and Kick questionnaire    Fear of Current or Ex-Partner: No    Emotionally Abused: No    Physically Abused: No    Sexually Abused: No    Review of Systems: See HPI, otherwise negative ROS  Physical Exam: BP 123/75   Pulse 76   Temp 98.7 F (37.1 C) (Oral)   Resp 15   SpO2 97%  General:   Alert,  Well-developed, well-nourished, pleasant and cooperative in NAD . Lungs:  Clear throughout to auscultation.   No wheezes, crackles, or rhonchi. No acute distress. Heart:  Regular rate and rhythm; no murmurs, clicks, rubs,  or  gallops. Abdomen: Non-distended, normal bowel sounds.  Soft and nontender without appreciable mass or hepatosplenomegaly.   Impression/Plan:    38 year old lady with intermittent nausea and vomiting history of low-grade follicular cell lymphoma.    Now on a GLP 1 modulator.    No dysphagia.  Agree with diagnostic EGD today. The risks, benefits, limitations, alternatives and imponderables have been reviewed with the patient. Potential for esophageal dilation, biopsy, etc. have also been reviewed.  Questions have been answered. All parties agreeable.     Notice: This dictation was prepared with Dragon dictation along with smaller phrase technology. Any transcriptional errors that result from this process are unintentional and may not be corrected upon review.

## 2023-12-28 NOTE — Transfer of Care (Signed)
 Immediate Anesthesia Transfer of Care Note  Patient: Betty Hayes  Procedure(s) Performed: EGD (ESOPHAGOGASTRODUODENOSCOPY)  Patient Location: Endoscopy Unit  Anesthesia Type:General  Level of Consciousness: awake, alert , oriented, and patient cooperative  Airway & Oxygen Therapy: Patient Spontanous Breathing  Post-op Assessment: Report given to RN, Post -op Vital signs reviewed and stable, and Patient moving all extremities X 4  Post vital signs: Reviewed and stable  Last Vitals:  Vitals Value Taken Time  BP 108/74 12/28/23 1024  Temp 36.6 C 12/28/23 1024  Pulse 87 12/28/23 1024  Resp 17 12/28/23 1024  SpO2 97 % 12/28/23 1024    Last Pain:  Vitals:   12/28/23 1024  TempSrc: Oral  PainSc: 0-No pain      Patients Stated Pain Goal: 7 (12/28/23 0841)  Complications: No notable events documented.

## 2023-12-28 NOTE — Progress Notes (Signed)
 Notified S.Gregory,CRNA of patient complaint of nausea.

## 2023-12-28 NOTE — Anesthesia Postprocedure Evaluation (Signed)
 Anesthesia Post Note  Patient: Betty Hayes  Procedure(s) Performed: EGD (ESOPHAGOGASTRODUODENOSCOPY)  Patient location during evaluation: Endoscopy Anesthesia Type: General Level of consciousness: awake and alert Pain management: pain level controlled Vital Signs Assessment: post-procedure vital signs reviewed and stable Respiratory status: spontaneous breathing, nonlabored ventilation, respiratory function stable and patient connected to nasal cannula oxygen Cardiovascular status: blood pressure returned to baseline and stable Postop Assessment: no apparent nausea or vomiting Anesthetic complications: no   There were no known notable events for this encounter.   Last Vitals:  Vitals:   12/28/23 0841 12/28/23 1024  BP: 123/75 108/74  Pulse: 76 87  Resp: 15 17  Temp: 37.1 C 36.6 C  SpO2: 97% 97%    Last Pain:  Vitals:   12/28/23 1024  TempSrc: Oral  PainSc: 0-No pain                 Jacari Kirsten L Chavon Lucarelli

## 2023-12-28 NOTE — Progress Notes (Signed)
 Patient reports nausea relief after zofran  administered by S.Gregory,CRNA.

## 2023-12-28 NOTE — Telephone Encounter (Signed)
-----   Message from Garnette Ka sent at 12/28/2023 10:35 AM EDT -----  new prescription for esomeprazole  40 mg pill.  Dispense 60 with 11 refills.  Take 30 minutes before breakfast and supper every day.

## 2023-12-28 NOTE — Op Note (Signed)
 [facility] Patient Name: Betty Hayes Procedure Date: 12/28/2023 9:46 AM MRN: [id number] Date of Birth: 1985-08-01 Attending MD: Gemma Kelp , MD, [id number] CSN: [id number] Age: 38 Admit Type: Outpatient Procedure:                Upper GI endoscopy Indications:              Nausea with vomiting Providers:                Gemma Kelp, MD, Vonna Guardian, Theola Fitch Referring MD:              Medicines:                Propofol  per Anesthesia Complications:            No immediate complications. Estimated Blood Loss:     Estimated blood loss was minimal. Procedure:                Pre-Anesthesia Assessment:                           - Prior to the procedure, a History and Physical                            was performed, and patient medications and                            allergies were reviewed. The patient's tolerance of                            previous anesthesia was also reviewed. The risks                            and benefits of the procedure and the sedation                            options and risks were discussed with the patient.                            All questions were answered, and informed consent                            was obtained. Prior Anticoagulants: The patient has                            taken no anticoagulant or antiplatelet agents. ASA                            Grade Assessment: II - A patient with mild systemic                            disease. After reviewing the risks and benefits,                            the patient was deemed in satisfactory condition to  undergo the procedure.                           After obtaining informed consent, the endoscope was                            passed under direct vision. Throughout the                            procedure, the patient's blood pressure, pulse, and                            oxygen saturations were monitored continuously. The                             [id number]-[id number] ([id number]) scope was introduced through the                            mouth, and advanced to the third part of duodenum.                            The upper GI endoscopy was accomplished without                            difficulty. The patient tolerated the procedure                            well. Scope In: 10:15:03 AM Scope Out: 10:20:17 AM Total Procedure Duration: 0 hours 5 minutes 14 seconds  Findings:      LA Grade B (one or more mucosal breaks greater than 5 mm, not extending       between the tops of two mucosal folds) esophagitis was found.      The entire examined stomach was normal.      The duodenal bulb, second portion of the duodenum and third portion of       the duodenum were normal. Biopsies of the 2nd and 3rd portion of the       duodenum taken. Impression:               - LA Grade B esophagitis.                           - Normal stomach.                           - Normal duodenal bulb, second portion of the                            duodenum and third portion of the duodenum.                           - Duodenal biopsies performed Moderate Sedation:      Moderate (conscious) sedation was personally administered by an       anesthesia professional. The following parameters were monitored: oxygen       saturation, heart rate, blood pressure, respiratory rate,  EKG, adequacy       of pulmonary ventilation, and response to care. Recommendation:           - Patient has a contact number available for                            emergencies. The signs and symptoms of potential                            delayed complications were discussed with the                            patient. Return to normal activities tomorrow.                            Written discharge instructions were provided to the                            patient.                           - Advance diet as tolerated. Increase esomeprazole                             to 40  mg twice daily. Follow-up on pathology.                            Office visit in 6 weeks. Procedure Code(s):        --- Professional ---                           [phone], Esophagogastroduodenoscopy, flexible,                            transoral; diagnostic, including collection of                            specimen(s) by brushing or washing, when performed                            (separate procedure) Diagnosis Code(s):        --- Professional ---                           K20.90, Esophagitis, unspecified without bleeding                           R11.2, Nausea with vomiting, unspecified CPT copyright 2022 American Medical Association. All rights reserved. The codes documented in this report are preliminary and upon coder review may  be revised to meet current compliance requirements. Windsor Hatcher. Alayla Dethlefs, MD Gemma Kelp, MD 12/28/2023 10:39:04 AM This report has been signed electronically. Number of Addenda: 0

## 2023-12-29 ENCOUNTER — Ambulatory Visit (INDEPENDENT_AMBULATORY_CARE_PROVIDER_SITE_OTHER): Admitting: Physician Assistant

## 2023-12-29 ENCOUNTER — Encounter (HOSPITAL_COMMUNITY): Payer: Self-pay | Admitting: Internal Medicine

## 2023-12-29 VITALS — BP 107/72 | HR 75 | Temp 98.6°F | Ht 66.0 in | Wt 207.0 lb

## 2023-12-29 DIAGNOSIS — E66812 Obesity, class 2: Secondary | ICD-10-CM | POA: Diagnosis not present

## 2023-12-29 DIAGNOSIS — E88819 Insulin resistance, unspecified: Secondary | ICD-10-CM

## 2023-12-29 DIAGNOSIS — K2100 Gastro-esophageal reflux disease with esophagitis, without bleeding: Secondary | ICD-10-CM

## 2023-12-29 DIAGNOSIS — E559 Vitamin D deficiency, unspecified: Secondary | ICD-10-CM

## 2023-12-29 DIAGNOSIS — Z6833 Body mass index (BMI) 33.0-33.9, adult: Secondary | ICD-10-CM

## 2023-12-29 MED ORDER — VITAMIN D (ERGOCALCIFEROL) 1.25 MG (50000 UNIT) PO CAPS: 50000.0000 [IU] | ORAL_CAPSULE | ORAL | 0 refills | Status: DC

## 2023-12-29 NOTE — Progress Notes (Signed)
 SUBJECTIVE: Discussed the use of AI scribe software for clinical note transcription with the patient, who gave verbal consent to proceed.  Chief Complaint: Obesity  Interim History: She is down 3 lbs since last visit Down 13 lbs overall TBW loss of 5.9%  Betty Hayes is here to discuss her progress with her obesity treatment plan. She is on the Category 2 Plan and states she is following her eating plan approximately 70 % of the time. She states she is exercising walking for 15 minutes 2-3 times per week.  Betty Hayes is a 38 year old female who presents for a follow-up on her obesity treatment plan.  She is currently on Wegovy  at a dose of 0.25 mg, having completed her fourth dose. She plans to maintain this dose to allow her body to adjust. She experiences early satiety and focuses on protein intake due to reduced appetite. Weekly episodes of nausea and diarrhea occur, which she associates with her Wegovy  injections but she has had some chronic nausea issues and underwent EGD yesterday which showed esophagitis. She administers Wegovy  on Wednesdays and notes increased nausea the day or two following the injection. She manages these symptoms by being mindful of her diet around these days.  She has a history of persistent nausea and acid reflux since her kidney surgery -pyeloplasty with antegrade ureteral stent placement by Dr. Antonetta Kitchen on 07/31/2023  An upper endoscopy revealed esophagitis despite already taking Nexium  once daily.  Her Nexium  dose was increased to twice daily. She is concerned about the interaction with her thyroid  medication, Synthroid , which she takes at 6:30 AM, and is exploring ways to manage the timing of these medications to avoid interaction.  Bopsies of the duodenum were taken to test for celiac disease and to check for any recurrence of previous cancer/lymphoma. She has noticed increased symptoms with gluten and dairy intake, although she typically avoids dairy except for  cheese.  She is a foster Regulatory affairs officer, typically starting work at 8 AM, and tries to eat by 9 AM. She drinks about 80 ounces of water  daily and is trying to incorporate more physical activity into her routine, including walking and considering pool exercises. She has two adult children, aged 8 and 58. Pharmacotherapy: Her insurance will not cover Zepbound  Insurance required her to establish with Yvonna Herder for weight management/ started her on Wegovy  [name] Provider is Nurse Practitioner and visits are virtual. Wegovy  0.25mg  started 11/25/2023  OV with [name] on 12/22/2023  EGD yesterday for Nausea/GERD/ Hx follicular lymphoma of duodenum-  per Dr. Riley Cheadle  Impression:               - LA Grade B esophagitis.                           - Normal stomach.                           - Normal duodenal bulb, second portion of the                            duodenum and third portion of the duodenum.                           - Duodenal biopsies performed- Pt reports done to help rule out celiac or other disorders History of follicular  lymphoma OBJECTIVE: Visit Diagnoses: Problem List Items Addressed This Visit     Vitamin D  deficiency   Insulin  resistance - Primary   Class 2 severe obesity with serious comorbidity and body mass index (BMI) of 35.0 to 35.9 in adult [facility])   Other Visit Diagnoses       Essential hypertension         BMI 33.0-33.9,adult Current BMI 33.43         Obesity Undergoing treatment with Wegovy  0.25 mg, having completed four doses from other provider per insurance requirements.  She has lost 3 pounds since the last visit. Experiences some weekly nausea and diarrhea, some predating wegovy  due to previous disease processes. She does note her appetite is  very suppressed,  and she is focusing on protein intake to maintain muscle mass. Advised against dose increase at this time.  Emphasized maintaining muscle mass and avoiding fatty foods around injection days to minimize nausea,  maintain excellent hydration and frequent smaller protein based meals. - Continue Wegovy  0.25 mg per [facility] provider Would not advise increase in dose of Wegovy  at this time due to significant appetite suppression as well as ongoing issues with GI conditions.  - Focus on protein intake to maintain muscle mass - Avoid fatty foods, especially around injection days - Increase physical activity, including pool exercises  Gastroesophageal Reflux Disease (GERD)/Esophagitis Experiencing nausea and acid reflux, exacerbated post-kidney surgery in January. Endoscopy revealed esophagitis despite Nexium  once daily  Nexium  increased to twice daily. Concern about interaction with Synthroid  and discussed at least 60 minutes between medications. Discussed using a wedge for sleeping to reduce reflux. Advised to consult endocrinologist regarding medication interaction with Synthroid . Biopsy results for follow up  of duodenal lymphoma follow up as well as evaluation for celiac are pending - Continue Nexium  twice daily - Consider using a wedge for sleeping to reduce reflux - Consult endocrinologist regarding Synthroid  and Nexium  interaction - Monitor for biopsy results for celiac disease   Insulin  Resistance Last fasting insulin  was 16.3-not at goal. A1c was 5.5- at goal. Polyphagia:No Medication(s): Wegovy  0.25 mg SQ weekly started 11/25/23 She is having nausea and reflux symptoms as noted with EGD results as noted Otherwise denies mass in neck, dysphagia, dyspepsia, persistent hoarseness, abdominal pain, or V/Constipation or diarrhea. Has annual eye exam. Mood is stable.   Lab Results  Component Value Date   HGBA1C 5.5 08/24/2023   HGBA1C 5.5 03/31/2023   HGBA1C 5.0 08/15/2016   Lab Results  Component Value Date   INSULIN  16.3 08/24/2023   INSULIN  18.0 03/31/2023    Plan: Continue Wegovy  0.25 mg SQ weekly per [facility] provider Would not advise increase in dose of Wegovy  at this time due to significant  appetite suppression as well as ongoing issues with GI conditions.  Continue working on nutrition plan to decrease simple carbohydrates, increase lean proteins and exercise to promote weight loss, improve glycemic control and prevent progression to Type 2 diabetes.    Thyroid  Medication Interaction Potential interaction between Synthroid  and Nexium  affecting absorption. Pharmacist recommended a 4-hour gap between medications. Advised to consult endocrinologist for confirmation. Discussed potential schedule adjustments to optimize medication absorption. - Consult endocrinologist regarding Synthroid  and Nexium  interaction - Consider taking Synthroid  upon waking and Nexium  before breakfast at least 1 hour following synthroid  per all resources I have reviewed during the office visit.    Vitamin D  Deficiency Vitamin D  is not at goal of 50.  Most recent vitamin D  level was 29.8. She is  on  prescription ergocalciferol  50,000 IU weekly. No N/V or muscle weakness with Ergocalciferol .  Lab Results  Component Value Date   VD25OH 29.8 (L) 08/24/2023   VD25OH 30.3 03/31/2023    Plan: Continue and refill  prescription ergocalciferol  50,000 IU weeklyx 30 days Low vitamin D  levels can be associated with adiposity and may result in leptin resistance and weight gain. Also associated with fatigue.  Currently on vitamin D  supplementation without any adverse effects such as nausea, vomiting or muscle weakness.  Meds ordered this encounter  Medications   Vitamin D , Ergocalciferol , (DRISDOL ) 1.25 MG (50000 UNIT) CAPS capsule    Sig: Take 1 capsule (50,000 Units total) by mouth every 7 (seven) days.    Dispense:  4 capsule    Refill:  0    General Health Maintenance Encouraged to maintain hydration and increase physical activity, including pool exercises as a joint-friendly activity. - Maintain hydration with at least 80 ounces of water  daily - Increase physical activity, including pool  exercises  Follow-up Advised to schedule follow-up appointments x 2  in advance to ensure continuity of care.   Vitals Temp: 98.6 F (37 C) BP: 107/72 Pulse Rate: 75 SpO2: 97 %   Anthropometric Measurements Height: 5\' 6"  (1.676 m) Weight: 207 lb (93.9 kg) BMI (Calculated): 33.43 Weight at Last Visit: 210 lb Weight Lost Since Last Visit: 3 lb Weight Gained Since Last Visit: 0 Starting Weight: 220 lb Total Weight Loss (lbs): 13 lb (5.897 kg) Peak Weight: 220 lb   Body Composition  Body Fat %: 41.4 % Fat Mass (lbs): 85.6 lbs Muscle Mass (lbs): 115.2 lbs Total Body Water  (lbs): 79.4 lbs Visceral Fat Rating : 9   Other Clinical Data Fasting: no Labs: no Today's Visit #: 11 Starting Date: 04/14/23     ASSESSMENT AND PLAN:  Diet: Betty Hayes is currently in the action stage of change. As such, her goal is to continue with weight loss efforts. She has agreed to Category 2 Plan.  Exercise: Abbee has been instructed to work up to a goal of 150 minutes of combined cardio and strengthening exercise per week for weight loss and overall health benefits.   Behavior Modification:  We discussed the following Behavioral Modification Strategies today: increasing lean protein intake, decreasing simple carbohydrates, increasing vegetables, increase H2O intake, no skipping meals, meal planning and cooking strategies, avoiding temptations, and planning for success. We discussed various medication options to help Betty Hayes with her weight loss efforts and we both agreed to continue current treatment plan, continue to work on nutritional and behavioral strategies to promote weight loss.  .  Return in about 4 weeks (around 01/26/2024).Aaron Aas She was informed of the importance of frequent follow up visits to maximize her success with intensive lifestyle modifications for her multiple health conditions.  Attestation Statements:   Reviewed by clinician on day of visit: allergies, medications,  problem list, medical history, surgical history, family history, social history, and previous encounter notes.   Time spent on visit including pre-visit chart review and post-visit care and charting was 40 minutes.    Zettie Gootee, PA-C

## 2023-12-30 ENCOUNTER — Ambulatory Visit: Payer: Self-pay | Admitting: Family Medicine

## 2023-12-30 ENCOUNTER — Ambulatory Visit: Payer: Self-pay | Admitting: Internal Medicine

## 2023-12-30 LAB — SURGICAL PATHOLOGY

## 2023-12-31 ENCOUNTER — Other Ambulatory Visit (HOSPITAL_COMMUNITY): Payer: Self-pay

## 2024-01-09 ENCOUNTER — Other Ambulatory Visit (HOSPITAL_COMMUNITY): Payer: Self-pay

## 2024-01-27 ENCOUNTER — Encounter (INDEPENDENT_AMBULATORY_CARE_PROVIDER_SITE_OTHER): Payer: Self-pay | Admitting: Family Medicine

## 2024-01-27 ENCOUNTER — Ambulatory Visit (INDEPENDENT_AMBULATORY_CARE_PROVIDER_SITE_OTHER): Admitting: Family Medicine

## 2024-01-27 VITALS — BP 117/75 | HR 82 | Temp 98.6°F | Ht 66.0 in | Wt 205.0 lb

## 2024-01-27 DIAGNOSIS — E88819 Insulin resistance, unspecified: Secondary | ICD-10-CM | POA: Diagnosis not present

## 2024-01-27 DIAGNOSIS — E559 Vitamin D deficiency, unspecified: Secondary | ICD-10-CM

## 2024-01-27 DIAGNOSIS — E669 Obesity, unspecified: Secondary | ICD-10-CM | POA: Diagnosis not present

## 2024-01-27 DIAGNOSIS — I10 Essential (primary) hypertension: Secondary | ICD-10-CM

## 2024-01-27 DIAGNOSIS — Z6833 Body mass index (BMI) 33.0-33.9, adult: Secondary | ICD-10-CM

## 2024-01-27 MED ORDER — VITAMIN D (ERGOCALCIFEROL) 1.25 MG (50000 UNIT) PO CAPS: 50000.0000 [IU] | ORAL_CAPSULE | ORAL | 0 refills | Status: DC

## 2024-02-05 NOTE — Progress Notes (Unsigned)
 GI Office Note    Referring Provider: Dettinger, Fonda LABOR, MD Primary Care Physician:  Dettinger, Fonda LABOR, MD Primary Gastroenterologist: Lamar HERO.Rourk, MD  Date:  02/08/2024  ID:  Betty Hayes, DOB 02-Jun-1986, MRN [id number]  Chief Complaint   Chief Complaint  Patient presents with   Follow-up    Pt arrives for follow up. Pt has questions in regards to test results from EGD. Pt states GERD is better.    History of Present Illness  Betty Hayes is a 38 y.o. 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   EGD 07/13/2019: - Normal esophagus s/p dilation and biopsy - Normal stomach - Mucosal changes of the duodenum, patchy whitish nodular mucosa - Duodenal biopsy revealed low-grade follicular lymphoma grade 1/2.  PET scan ordered as well as referral to oncology.   EGD 05/09/2020: - Normal esophagus - Normal stomach - Regression of abnormal duodenal mucosa, rest of 1st, 2nd, and 3rd portion of duodenum unremarkable  Last office visit 10/26/23. Nightly nausea for 4 weeks. Tums not helping. Awakens at night to go vomit. Rare alcohol use. Tries to avoid spicy foods. Only taking zofran  if severe. Occasional constipation. No bloating. Firm stools. Advised to stop pantoprazole  and start nexium  before supper. Trial peppermint tea or ginger prior to bed. Zofran  as needed. Scheduled for EGD.   EGD 12/28/23: - Grade B esophagitis - normal stomach - normal duodenum s/p biopsy (atypical lymphoid infiltrate minimally expands 2 villi) - no lymphoma - Advised PPI BID and follow up with Dr. MARLA - cannot confirm lymphoma present.    Today:  Reflux has gotten better. No upper abdominal pain.   Has been constipated. Has been trying to figure out what she can eat fiber wise. Drinks lots of water . Trying to eat more fruits. Has psyllium at home and used it  only once.   Last few weeks has not needed the zofran  as much. Curious about eating smaller meals throughout the day.   Wt Readings from Last 5 Encounters:  02/08/24 208 lb 1.6 oz (94.4 kg)  01/27/24 205 lb (93 kg)  12/29/23 207 lb (93.9 kg)  12/23/23 209 lb (94.8 kg)  11/30/23 210 lb (95.3 kg)    Current Outpatient Medications  Medication Sig Dispense Refill   albuterol  (VENTOLIN  HFA) 108 (90 Base) MCG/ACT inhaler TAKE 2 PUFFS BY MOUTH EVERY 6 HOURS AS NEEDED FOR WHEEZE OR SHORTNESS OF BREATH 8.5 each 3   Ascorbic Acid (VITAMIN C PO) Take 1 tablet by mouth daily as needed (immune support).     cephALEXin (KEFLEX) 500 MG capsule Take 500 mg by mouth.     Cyanocobalamin  (VITAMIN B-12 PO) Take 1 tablet by mouth in the morning.     DTx App - Wellness ([name] TIER 3 OBESITY) MISC      ECHINACEA PO Take 760 mg by mouth daily as needed (immune support).     esomeprazole  (NEXIUM ) 40 MG capsule Take 1 capsule (40 mg total) by mouth 2 (two) times daily before a meal. 60 capsule 11   hydrOXYzine  (VISTARIL ) 25 MG capsule Take 1 capsule (25 mg total) by mouth every 8 (eight) hours as needed. 60 capsule 2   levothyroxine  (SYNTHROID ) 88 MCG tablet Take 88 mcg by mouth.     liothyronine  (CYTOMEL ) 5 MCG tablet Take 5 mcg by mouth daily.  lisinopril  (ZESTRIL ) 5 MG tablet TAKE 1 TABLET (5 MG TOTAL) BY MOUTH DAILY. 90 tablet 0   MAGNESIUM  GLUCONATE PO Take 120 mg by mouth every evening.     Multiple Vitamins-Minerals (ZINC) LOZG Take 1 lozenge by mouth daily as needed (immune support).     ondansetron  (ZOFRAN ) 4 MG tablet Take 1 tablet (4 mg total) by mouth daily as needed for nausea or vomiting. 30 tablet 1   Selenium 200 MCG CAPS Take 200 mcg by mouth in the morning.     Vitamin D , Ergocalciferol , (DRISDOL ) 1.25 MG (50000 UNIT) CAPS capsule Take 1 capsule (50,000 Units total) by mouth every 7 (seven) days. 4 capsule 0   WEGOVY  0.25 MG/0.5ML SOAJ Inject 0.25 mg into the skin once a week.     No  current facility-administered medications for this visit.    Past Medical History:  Diagnosis Date   Allergy    Anxiety    Asthma    Cancer (HCC) 06/2019   follicular lymphoma   Dysphagia    Follicular lymphoma (HCC)    low-grade follicular lymphoma of duodenum   GERD (gastroesophageal reflux disease)    Heart murmur    History of kidney stones    Hypertension    Hypothyroidism due to Hashimoto's thyroiditis    Insulin  resistance    Kidney stone    PONV (postoperative nausea and vomiting)    Thyroid  nodule 2020   Vitamin D  deficiency     Past Surgical History:  Procedure Laterality Date   ABDOMINAL HYSTERECTOMY     BIOPSY  05/09/2020   Procedure: BIOPSY;  Surgeon: Shaaron Lamar HERO, MD;  Location: [facility];  Service: Endoscopy;;  duodenal   CYSTOSCOPY WITH RETROGRADE PYELOGRAM, URETEROSCOPY AND STENT PLACEMENT Right 06/10/2023   Procedure: CYSTOSCOPY WITH RIGHT RETROGRADE PYELOGRAM, URETEROSCOPY AND RIGHT URETERAL STENT PLACEMENT;  Surgeon: Devere Lonni Righter, MD;  Location: [facility];  Service: Urology;  Laterality: Right;  45 MINUTES   ESOPHAGOGASTRODUODENOSCOPY N/A 07/13/2019   Procedure: ESOPHAGOGASTRODUODENOSCOPY (EGD);  Surgeon: Shaaron Lamar HERO, MD; normal esophagus s/p dilation and biopsied, normal stomach, patchy areas of whitish, nodular mucosa in the second portion of the duodenum s/p biopsy.  Duodenal biopsy with low-grade follicular lymphoma.  Esophageal biopsies benign without increased intraepithelial eosinophils.   ESOPHAGOGASTRODUODENOSCOPY N/A 05/09/2020   Procedure: ESOPHAGOGASTRODUODENOSCOPY (EGD);  Surgeon: Shaaron Lamar HERO, MD;  [facility]: [facility];  Service: Endoscopy;  Laterality: N/A;  7:30pm   ESOPHAGOGASTRODUODENOSCOPY N/A 12/28/2023   Procedure: EGD (ESOPHAGOGASTRODUODENOSCOPY);  Surgeon: Shaaron Lamar HERO, MD;  Location: [facility];  Service: Endoscopy;  Laterality: N/A;  945AM, ASA 2   KIDNEY STONE SURGERY     MALONEY DILATION N/A  07/13/2019   Procedure: MALONEY DILATION;  Surgeon: Shaaron Lamar HERO, MD;  Location: [facility];  Service: Endoscopy;  Laterality: N/A;   ROBOT ASSISTED PYELOPLASTY Right 07/31/2023   Procedure: XI ROBOTIC ASSISTED RIGHT PYELOPLASTY;  Surgeon: Devere Lonni Righter, MD;  Location: [facility];  Service: Urology;  Laterality: Right;  180 MINUTES   TONSILLECTOMY     Around age 42   WISDOM TOOTH EXTRACTION      Family History  Problem Relation Age of Onset   Thrombocytopenia Mother        itp   Heart disease Mother    Hypertension Mother    Seizures Brother    ADD / ADHD Brother    Colon cancer Paternal Grandfather        in 58s  Diabetes Maternal Grandmother    Stroke Maternal Grandmother    Heart disease Maternal Grandfather    Stroke Maternal Grandfather    Hypothyroidism Paternal Grandmother    Psoriasis Brother    Asthma Daughter    Kidney disease Maternal Uncle    Non-Hodgkin's lymphoma Paternal Uncle    Leukemia Paternal Great-grandmother    Ovarian cancer Maternal Great-grandmother    Breast cancer Other        maternal great aunt   Colon polyps Neg Hx     Allergies as of 02/08/2024 - Review Complete 02/08/2024  Allergen Reaction Noted   Latex Rash 01/03/2016    Social History   Socioeconomic History   Marital status: Married    Spouse name: Not on file   Number of children: 2   Years of education: Not on file   Highest education level: Not on file  Occupational History    Employer: [employer]  Tobacco Use   Smoking status: Never   Smokeless tobacco: Never  Vaping Use   Vaping status: Never Used  Substance and Sexual Activity   Alcohol use: Yes    Comment: occasional- maybe once a month   Drug use: No   Sexual activity: Yes    Birth control/protection: Surgical    Comment: married 14 years  Other Topics Concern   Not on file  Social History Narrative   Not on file   Social Drivers of Health   Financial Resource Strain: Patient Declined  (03/06/2023)   Overall Financial Resource Strain (CARDIA)    Difficulty of Paying Living Expenses: Patient declined  Food Insecurity: Patient Declined (07/31/2023)   Hunger Vital Sign    Worried About Running Out of Food in the Last Year: Patient declined    Ran Out of Food in the Last Year: Patient declined  Transportation Needs: No Transportation Needs (07/31/2023)   PRAPARE - Administrator, Civil Service (Medical): No    Lack of Transportation (Non-Medical): No  Physical Activity: Insufficiently Active (03/06/2023)   Exercise Vital Sign    Days of Exercise per Week: 1 day    Minutes of Exercise per Session: 20 min  Stress: Patient Declined (03/06/2023)   [employer] of Occupational Health - Occupational Stress Questionnaire    Feeling of Stress : Patient declined  Social Connections: Unknown (03/06/2023)   Social Connection and Isolation Panel    Frequency of Communication with Friends and Family: Patient declined    Frequency of Social Gatherings with Friends and Family: Patient declined    Attends Religious Services: Patient declined    Database administrator or Organizations: Patient declined    Attends Engineer, structural: Not on file    Marital Status: Patient declined     Review of Systems   Gen: Denies fever, chills, anorexia. Denies fatigue, weakness, weight loss.  CV: Denies chest pain, palpitations, syncope, peripheral edema, and claudication. Resp: Denies dyspnea at rest, cough, wheezing, coughing up blood, and pleurisy. GI: See HPI Derm: Denies rash, itching, dry skin Psych: Denies depression, anxiety, memory loss, confusion. No homicidal or suicidal ideation.  Heme: Denies bruising, bleeding, and enlarged lymph nodes.  Physical Exam   BP 130/73   Pulse 78   Temp 98 F (36.7 C)   Ht 5' 6.5 (1.689 m)   Wt 208 lb 1.6 oz (94.4 kg)   BMI 33.09 kg/m   General:   Alert and oriented. No distress noted. Pleasant and cooperative.  Head:  Normocephalic and atraumatic. Eyes:  Conjuctiva clear without scleral icterus. Mouth:  Oral mucosa pink and moist. Good dentition. No lesions. Lungs:  Clear to auscultation bilaterally. No wheezes, rales, or rhonchi. No distress.  Heart:  S1, S2 present without murmurs appreciated.  Abdomen:  +BS, soft, non-tender and non-distended. No rebound or guarding. No HSM or masses noted. Rectal: deferred Msk:  Symmetrical without gross deformities. Normal posture. Extremities:  Without edema. Neurologic:  Alert and  oriented x4 Psych:  Alert and cooperative. Normal mood and affect.  Assessment  Betty Hayes is a 38 y.o. female presenting today for follow-up post upper endoscopy.  History of duodenal follicular lymphoma:  - Recent EGD cannot confirm lymphoma based on stains given nothiong noted in deeper sections.  - We discussed her pathology in detail today and we will make a plan to follow-up with Dr. MARLA to see if additional treatment is needed or sooner surveillance is needed.  GERD, nausea and vomiting: - Recent EGD with grade B esophagitis. - Currently symptoms have been improved with twice daily PPI.  We will continue this for now. - Given documented esophagitis, if symptoms were to worsen we could consider trial of Voquezna. - Also discussed medication mechanism of action of PPI in detail. - Currently without significant nausea, continue Zofran  as needed.  We did discuss smaller meals throughout the day rather than larger meals which may also potentially help her with reflux and nausea.  Constipation: - Likely result of Wegovy  - Advised to continue daily fiber supplement, can switch from powder to capsules. - Start MiraLAX once daily if no improvement with fiber.   PLAN   Nexium  40 mg BID Zofran  as needed High fiber diet Daily fiber supplement recommended.  Miralax 8.5 g-17g daily.  Follow up 4 months.   Reach out to Dr. MARLA regarding path and appt.  Charmaine Melia, MSN,  FNP-BC, AGACNP-BC [facility]

## 2024-02-06 DIAGNOSIS — Z23 Encounter for immunization: Secondary | ICD-10-CM | POA: Diagnosis not present

## 2024-02-06 DIAGNOSIS — S99922A Unspecified injury of left foot, initial encounter: Secondary | ICD-10-CM | POA: Diagnosis not present

## 2024-02-08 ENCOUNTER — Encounter: Payer: Self-pay | Admitting: Gastroenterology

## 2024-02-08 ENCOUNTER — Ambulatory Visit (INDEPENDENT_AMBULATORY_CARE_PROVIDER_SITE_OTHER): Admitting: Gastroenterology

## 2024-02-08 VITALS — BP 130/73 | HR 78 | Temp 98.0°F | Ht 66.5 in | Wt 208.1 lb

## 2024-02-08 DIAGNOSIS — K219 Gastro-esophageal reflux disease without esophagitis: Secondary | ICD-10-CM | POA: Diagnosis not present

## 2024-02-08 DIAGNOSIS — C8209 Follicular lymphoma grade I, extranodal and solid organ sites: Secondary | ICD-10-CM

## 2024-02-08 DIAGNOSIS — R112 Nausea with vomiting, unspecified: Secondary | ICD-10-CM | POA: Diagnosis not present

## 2024-02-08 DIAGNOSIS — F1090 Alcohol use, unspecified, uncomplicated: Secondary | ICD-10-CM | POA: Diagnosis not present

## 2024-02-08 DIAGNOSIS — Z8572 Personal history of non-Hodgkin lymphomas: Secondary | ICD-10-CM

## 2024-02-08 DIAGNOSIS — K5900 Constipation, unspecified: Secondary | ICD-10-CM | POA: Diagnosis not present

## 2024-02-08 NOTE — Patient Instructions (Addendum)
 Continue Nexium  40 mg twice daily. Continue zofran  as needed.   Continue to follow a GERD diet:  Avoid fried, fatty, greasy, spicy, citrus foods. Avoid caffeine and carbonated beverages. Avoid chocolate. Try eating 4-6 small meals a day rather than 3 large meals. Do not eat within 3 hours of laying down. Prop head of bed up on wood or bricks to create a 6 inch incline.  To help you with constipation, I recommend you try to be more consistent with your fiber supplement.  If you feel like you are unable to tolerate your powder supplement, you can pick up psyllium (Metamucil) capsules and take these once daily and slowly increase to twice daily.  Will not tolerate.  If after 4 to 6 weeks you are not seeing any improvement with fiber supplement I do recommend starting MiraLAX, you can start with a half a capful daily and increase to a capful if needed.  I have attached a handout about your paperwork today that gives you fiber content in foods.  Goal should be about 20+ grams of fiber daily to help with constipation and colon health.  I do agree with smaller more frequent meals I feel as though this would be beneficial for you.  I will reach out to Dr. MARLA to get his recommendation based off your pathology results, in the meantime I do recommend a follow-up with them.  If there is any recommendations for them we will let you know. Follow up here in 4 months.   It was a pleasure to see you today. I want to create trusting relationships with patients. If you receive a survey regarding your visit,  I greatly appreciate you taking time to fill this out on paper or through your MyChart. I value your feedback.  Charmaine Melia, MSN, FNP-BC, AGACNP-BC [facility]

## 2024-02-21 NOTE — Progress Notes (Signed)
 Betty Hayes, D.O.  ABFM, ABOM Specializing in Clinical Bariatric Medicine  Office located at: [address] [place], [place]  [address]   Assessment and Plan:   Medications Discontinued During This Encounter  Medication Reason   Vitamin D , Ergocalciferol , (DRISDOL ) 1.25 MG (50000 UNIT) CAPS capsule Reorder    Meds ordered this encounter  Medications   Vitamin D , Ergocalciferol , (DRISDOL ) 1.25 MG (50000 UNIT) CAPS capsule    Sig: Take 1 capsule (50,000 Units total) by mouth every 7 (seven) days.    Dispense:  4 capsule    Refill:  0     FOR THE DISEASE OF OBESITY: Obesity with Starting BMI of 35.5 BMI 33.0-33.9,adult Current BMI 33.09 Assessment & Plan: Since last office visit on 12/29/23 patient's muscle mass has increased by 0.2 lbs. Fat mass has decreased by 1.6 lbs. Total body water  has increased by 1.2 lbs. Counseling done on how various foods will affect these numbers and how to maximize success  Total lbs lost to date: 15  lbs Total weight loss percentage to date: -6.82 %   Recommended Dietary Goals Kemi is currently in the action stage of change. As such, her goal is to continue weight management plan.  She has agreed to: follow CAT 2 MP and journaling 1200 calories and 80++ g of protein    Behavioral Intervention We discussed the following today: increasing lean protein intake to established goals, decreasing simple carbohydrates , increasing vegetables, work on tracking and journaling calories using tracking application, keeping healthy foods at home, and decreasing eating out or consumption of processed foods, and making healthy choices when eating convenient foods  Additional resources provided today: None  Evidence-based interventions for health behavior change were utilized today including the discussion of self monitoring techniques, problem-solving barriers and SMART goal setting techniques.   Regarding patient's less desirable eating habits and  patterns, we employed the technique of small changes.   Pt will specifically work on: n/a   Recommended Physical Activity Goals Mlissa has been advised to work up to 300-450 minutes of moderate intensity aerobic activity a week and strengthening exercises 2-3 times per week for cardiovascular health, weight loss maintenance and preservation of muscle mass.   She has agreed to: Continue current level of physical activity    Pharmacotherapy Had her first dose of Wegovy  0.25 on 11/25/23. Good compliance and tolerance. She will be increasing her dose to the 0.5 mg tomorrow, 7/10. Pt insurance did not cover Zepbound . Since starting Wegovy , she has lost 2.4 lbs in muscle mass and 1.8 lbs in fat mass.   We both agreed to: Continue with current nutritional and behavioral strategies and c/w Wegovy .   ASSOCIATED CONDITIONS ADDRESSED TODAY: Insulin  resistance Assessment & Plan: Lab Results  Component Value Date   HGBA1C 5.5 08/24/2023   HGBA1C 5.5 03/31/2023   HGBA1C 5.0 08/15/2016   INSULIN  16.3 08/24/2023   INSULIN  18.0 03/31/2023    Reviewed latest labs: A1c stable, insulin  is above goal at 16.3 about 5 months ago. No meds currently. Diet/lifestyle interventions approach. Has been more active this summer.   Continue with best efforts to follow prescribed meal plan and increase regular exercise. Encouraged pt to continue prioritizing lean proteins, reduce amounts she eats out, and increase daily water  intake. Will continue monitoring alongside PCP. Recheck A1c and insulin  periodically.     Vitamin D  deficiency Assessment & Plan: Lab Results  Component Value Date   VD25OH 29.8 (L) 08/24/2023   VD25OH  30.3 03/31/2023   Last obtained VD was below goal at 29.8. Pt taking ERGO 50,000 IU weekly. No side effects; n/v or muscle weakness with ERGO.   Continue supplementation as prescribed. Refilling ERGO today.    Essential hypertension Assessment & Plan: BP Readings from Last 3  Encounters:  01/27/24 117/75  12/29/23 107/72   On Lisinopril  5 mg once daily. Reports good compliance and tolerance with no side effects. Monitors her BP at home; averages 110s/70s. Pt is asx and reports no acute concerns.   Continue with current antihypertensive regimen as prescribed. Continue best efforts with following heart healthy diet per nutritional meal plan. Avoid high sodium foods and stay properly hydrated. Work on gradually increasing regular exercise throughout the summer. Will monitor alongside PCP.     Follow up:   Return in about 4 weeks (around 02/24/2024). She was informed of the importance of frequent follow up visits to maximize her success with intensive lifestyle modifications for her multiple health conditions.  Subjective:   Chief complaint: Obesity Betty Hayes is here to discuss her progress with her obesity treatment plan. She is on the Category 2 Plan and states she is following her eating plan approximately 500% of the time. She states she is walking 10 minutes 2 days per week.  Interval History:  Ajiah S Bitton is here for a follow up office visit. Since last OV on 12/29/23, she is down 2 lbs. She has been working on prioritizing her lean protein intake. Some off plan eating reported. Had a [place] cheese streak without the bread and provolone. Was not eating things that she feels she did not have to eat.   Pharmacotherapy that aid with weight loss: She is currently taking Wegovy  0.25 mg once weekly.   Review of Systems:  Pertinent positives were addressed with patient today.  Reviewed by clinician on day of visit: allergies, medications, problem list, medical history, surgical history, family history, social history, and previous encounter notes.  Weight Summary and Biometrics   See Synopsis for biometric data obtained today  Objective:   PHYSICAL EXAM: Blood pressure 117/75, pulse 82, temperature 98.6 F (37 C), height 5' 6 (1.676 m), weight 205 lb (93  kg), SpO2 98%. Body mass index is 33.09 kg/m.  General: she is overweight, cooperative and in no acute distress. PSYCH: Has normal mood, affect and thought process.   HEENT: EOMI, sclerae are anicteric. Lungs: Normal breathing effort, no conversational dyspnea. Extremities: Moves * 4 Neurologic: A and O * 3, good insight  DIAGNOSTIC DATA REVIEWED: BMET    Component Value Date/Time   NA 137 12/23/2023 1541   K 4.3 12/23/2023 1541   CL 100 12/23/2023 1541   CO2 20 12/23/2023 1541   GLUCOSE 85 12/23/2023 1541   GLUCOSE 89 08/10/2023 1439   BUN 11 12/23/2023 1541   CREATININE 0.69 12/23/2023 1541   CALCIUM 10.1 12/23/2023 1541   GFRNONAA >60 08/10/2023 1439   GFRAA >60 01/24/2020 0814   Lab Results  Component Value Date   HGBA1C 5.5 08/24/2023   HGBA1C 5.0 08/15/2016   Lab Results  Component Value Date   INSULIN  16.3 08/24/2023   INSULIN  18.0 03/31/2023   Lab Results  Component Value Date   TSH 0.400 (L) 03/31/2023   CBC    Component Value Date/Time   WBC 9.3 12/23/2023 1541   WBC 10.2 08/10/2023 1439   RBC 4.96 12/23/2023 1541   RBC 4.95 08/10/2023 1439   HGB 13.9 12/23/2023 1541   HCT  42.3 12/23/2023 1541   PLT 243 12/23/2023 1541   MCV 85 12/23/2023 1541   MCH 28.0 12/23/2023 1541   MCH 26.3 08/10/2023 1439   MCHC 32.9 12/23/2023 1541   MCHC 31.9 08/10/2023 1439   RDW 12.8 12/23/2023 1541   Iron Studies No results found for: IRON, TIBC, FERRITIN, IRONPCTSAT Lipid Panel     Component Value Date/Time   CHOL 186 12/23/2023 1541   TRIG 118 12/23/2023 1541   HDL 51 12/23/2023 1541   CHOLHDL 3.6 12/23/2023 1541   LDLCALC 114 (H) 12/23/2023 1541   Hepatic Function Panel     Component Value Date/Time   PROT 7.5 12/23/2023 1541   ALBUMIN 4.8 12/23/2023 1541   AST 17 12/23/2023 1541   ALT 15 12/23/2023 1541   ALKPHOS 90 12/23/2023 1541   BILITOT 0.8 12/23/2023 1541      Component Value Date/Time   TSH 0.400 (L) 03/31/2023 0820    Nutritional Lab Results  Component Value Date   VD25OH 29.8 (L) 08/24/2023   VD25OH 30.3 03/31/2023    Attestations:   I, Vernell Forest, acting as a medical scribe for Betty Jenkins, DO., have compiled all relevant documentation for today's office visit on behalf of Betty Jenkins, DO, while in the presence of [employer], DO.  I have reviewed the above documentation for accuracy and completeness, and I agree with the above. Betty JINNY Hayes, D.O.  The 21st Century Cures Act was signed into law in 2016 which includes the topic of electronic health records.  This provides immediate access to information in MyChart.  This includes consultation notes, operative notes, office notes, lab results and pathology reports.  If you have any questions about what you read please let us  know at your next visit so we can discuss your concerns and take corrective action if need be.  We are right here with you.

## 2024-02-23 ENCOUNTER — Ambulatory Visit (INDEPENDENT_AMBULATORY_CARE_PROVIDER_SITE_OTHER): Admitting: Adult Health

## 2024-02-23 ENCOUNTER — Encounter (INDEPENDENT_AMBULATORY_CARE_PROVIDER_SITE_OTHER): Payer: Self-pay | Admitting: Adult Health

## 2024-02-23 VITALS — BP 118/78 | HR 80 | Temp 98.1°F | Ht 66.0 in | Wt 200.0 lb

## 2024-02-23 DIAGNOSIS — Z6832 Body mass index (BMI) 32.0-32.9, adult: Secondary | ICD-10-CM

## 2024-02-23 DIAGNOSIS — E669 Obesity, unspecified: Secondary | ICD-10-CM

## 2024-02-23 DIAGNOSIS — E88819 Insulin resistance, unspecified: Secondary | ICD-10-CM | POA: Diagnosis not present

## 2024-02-23 DIAGNOSIS — E559 Vitamin D deficiency, unspecified: Secondary | ICD-10-CM

## 2024-02-23 DIAGNOSIS — E66812 Obesity, class 2: Secondary | ICD-10-CM

## 2024-02-23 DIAGNOSIS — K2100 Gastro-esophageal reflux disease with esophagitis, without bleeding: Secondary | ICD-10-CM | POA: Diagnosis not present

## 2024-02-23 DIAGNOSIS — Z6833 Body mass index (BMI) 33.0-33.9, adult: Secondary | ICD-10-CM

## 2024-02-23 MED ORDER — SEMAGLUTIDE-WEIGHT MANAGEMENT 0.5 MG/0.5ML ~~LOC~~ SOAJ: 0.5000 mg | SUBCUTANEOUS | Status: AC

## 2024-02-23 MED ORDER — VITAMIN D (ERGOCALCIFEROL) 1.25 MG (50000 UNIT) PO CAPS: 50000.0000 [IU] | ORAL_CAPSULE | ORAL | 0 refills | Status: DC

## 2024-02-23 NOTE — Progress Notes (Signed)
 WEIGHT SUMMARY AND BIOMETRICS  Vitals Temp: 98.1 F (36.7 C) BP: 118/78 Pulse Rate: 80 SpO2: 98 %   Anthropometric Measurements Height: 5' 6 (1.676 m) Weight: 200 lb (90.7 kg) BMI (Calculated): 32.3 Weight at Last Visit: 205 lb Weight Lost Since Last Visit: 5 lb Weight Gained Since Last Visit: 0 Starting Weight: 220 lb Total Weight Loss (lbs): 20 lb (9.072 kg) Peak Weight: 220 lb   Body Composition  Body Fat %: 39.4 % Fat Mass (lbs): 79 lbs Muscle Mass (lbs): 115.4 lbs Total Body Water  (lbs): 76.8 lbs Visceral Fat Rating : 8   Other Clinical Data Fasting: no Labs: no Today's Visit #: 13 Starting Date: 04/14/23    Chief Complaint:   OBESITY Betty Hayes is here to discuss her progress with her obesity treatment plan.  She is on the the Category 2 Plan and states she is following her eating plan approximately 75 % of the time.  She states she is exercising Walking/Swimming 15/60 minutes 3/2 times per week.  Interim History:  Her insurance will not cover Zepbound  Her insurance required her to establish with [name] for weight management and started her on Wegovy  Pharmacologist Provider is Nurse Practitioner and visits are virtual. She had her first injection of Wegovy  0.25mg  on 11/25/2023 She increased Wegovy  0.25mg  to 0.5mg  on/about 01/28/2024 She will experience nausea without vomiting for up to 72 hours after GLP-1 injection [name] provider has started her on PRN Zofran  to treat acute GI sx's  Reviewed Bioimpedance results with pt: Muscle Mass: No change Adipose Mass: - 5 lbs   Subjective:   1. Insulin  resistance  Latest Reference Range & Units 08/24/23 12:52  Glucose 70 - 99 mg/dL 89  Hemoglobin J8R 4.8 - 5.6 % 5.5  Est. average glucose Bld gHb Est-mCnc mg/dL 888  INSULIN  2.6 - 24.9 uIU/mL 16.3   Her insurance will not cover Zepbound  Her insurance required her to establish with [name] for weight management and started her on Wegovy  Wellsite geologist is Scientist, water quality and visits are virtual. She had her first injection of Wegovy  0.25mg  on 11/25/2023 She increased Wegovy  0.25mg  to 0.5mg  on/about 01/28/2024 She will experience nausea without vomiting for up to 72 hours after GLP-1 injection [name] provider has started her on PRN Zofran  to treat acute GI sx's  2. Vitamin D  deficiency She is on weekly Ergocalciferol - denies N/V/Muscle Weakness  3. Gastroesophageal reflux disease with esophagitis without hemorrhage  [name] OV NOTES-  Date:  02/08/2024  ID:  Betty Hayes, DOB Dec 09, 1985, MRN [id number]   Chief Complaint        Chief Complaint  Patient presents with   Follow-up      Pt arrives for follow up. Pt has questions in regards to test results from EGD. Pt states GERD is better.     History of Present Illness  Betty Hayes is a 38 y.o. 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    EGD 07/13/2019: - Normal esophagus s/p dilation and biopsy - Normal stomach - Mucosal changes of the duodenum, patchy whitish nodular mucosa - Duodenal biopsy revealed low-grade follicular lymphoma grade 1/2.  PET scan ordered as well as referral to oncology.   EGD 05/09/2020: - Normal esophagus - Normal stomach - Regression of abnormal duodenal mucosa, rest of 1st, 2nd, and 3rd portion of duodenum unremarkable  Last office visit 10/26/23. Nightly nausea for 4 weeks. Tums not helping. Awakens at night to go vomit. Rare alcohol use. Tries to avoid spicy foods. Only taking zofran  if severe. Occasional constipation. No bloating. Firm stools. Advised to stop pantoprazole  and start nexium  before supper. Trial peppermint tea or ginger prior to bed. Zofran  as needed. Scheduled for EGD.    EGD 12/28/23: - Grade B esophagitis - normal stomach - normal duodenum s/p biopsy (atypical lymphoid infiltrate  minimally expands 2 villi) - no lymphoma - Advised PPI BID and follow up with Dr. MARLA - cannot confirm lymphoma present.  Today: Reflux has gotten better. No upper abdominal pain.  Has been constipated. Has been trying to figure out what she can eat fiber wise. Drinks lots of water . Trying to eat more fruits. Has psyllium at home and used it only once.  Last few weeks has not needed the zofran  as much. Curious about eating smaller meals throughout the day.   Assessment/Plan:   1. Insulin  resistance (Primary) Eat small, easily digestible meals Remain well hydrated If GI upset continues s/p GLP-1 therapy- f/u with prescribing provider  2. Vitamin D  deficiency Refill - Vitamin D , Ergocalciferol , (DRISDOL ) 1.25 MG (50000 UNIT) CAPS capsule; Take 1 capsule (50,000 Units total) by mouth every 7 (seven) days.  Dispense: 4 capsule; Refill: 0  3. Gastroesophageal reflux disease with esophagitis without hemorrhage [name] OV NOTES- PLAN    Nexium  40 mg BID Zofran  as needed High fiber diet Daily fiber supplement recommended.  Miralax 8.5 g-17g daily.  Follow up 4 months.    Reach out to Dr. MARLA regarding path and appt. 4. BMI 33.0-33.9,adult Current BMI 32.4  Betty Hayes is currently in the action stage of change. As such, her goal is to continue with weight loss efforts. She has agreed to the Category 2 Plan.   Exercise goals: For substantial health benefits, adults should do at least 150 minutes (2 hours and 30 minutes) a week of moderate-intensity, or 75 minutes (1 hour and 15 minutes) a week of vigorous-intensity aerobic physical activity, or an equivalent combination of moderate- and vigorous-intensity aerobic activity. Aerobic activity should be performed in episodes of at least 10 minutes, and preferably, it should be spread throughout the week.  Behavioral modification strategies: increasing lean protein intake, decreasing simple carbohydrates, increasing vegetables,  increasing water  intake, no skipping meals, meal planning and cooking strategies, keeping healthy foods in the home, ways to avoid boredom eating, and planning for success.  Betty Hayes has agreed to follow-up with our clinic in 4 weeks. She was informed of the importance of frequent follow-up visits to maximize her success with intensive lifestyle modifications for her multiple health conditions.   Objective:   Blood pressure 118/78, pulse 80, temperature 98.1 F (36.7 C), height 5' 6 (1.676 m), weight 200 lb (90.7 kg), SpO2 98%. Body mass index is 32.28 kg/m.  General: Cooperative, alert, well developed, in no acute distress. HEENT: Conjunctivae and lids unremarkable. Cardiovascular: Regular rhythm.  Lungs: Normal work of breathing. Neurologic: No focal deficits.   Lab Results  Component Value Date   CREATININE 0.69 12/23/2023   BUN 11 12/23/2023   NA 137 12/23/2023   K 4.3 12/23/2023   CL 100 12/23/2023   CO2 20 12/23/2023   Lab Results  Component Value Date   ALT 15 12/23/2023   AST 17 12/23/2023   ALKPHOS 90 12/23/2023   BILITOT 0.8 12/23/2023   Lab Results  Component Value Date   HGBA1C  5.5 08/24/2023   HGBA1C 5.5 03/31/2023   HGBA1C 5.0 08/15/2016   Lab Results  Component Value Date   INSULIN  16.3 08/24/2023   INSULIN  18.0 03/31/2023   Lab Results  Component Value Date   TSH 0.400 (L) 03/31/2023   Lab Results  Component Value Date   CHOL 186 12/23/2023   HDL 51 12/23/2023   LDLCALC 114 (H) 12/23/2023   TRIG 118 12/23/2023   CHOLHDL 3.6 12/23/2023   Lab Results  Component Value Date   VD25OH 29.8 (L) 08/24/2023   VD25OH 30.3 03/31/2023   Lab Results  Component Value Date   WBC 9.3 12/23/2023   HGB 13.9 12/23/2023   HCT 42.3 12/23/2023   MCV 85 12/23/2023   PLT 243 12/23/2023   No results found for: IRON, TIBC, FERRITIN  Attestation Statements:   Reviewed by clinician on day of visit: allergies, medications, problem list, medical  history, surgical history, family history, social history, and previous encounter notes.  I have reviewed the above documentation for accuracy and completeness, and I agree with the above. -  Opal Mckellips d. Maisen Klingler, NP-C

## 2024-03-16 ENCOUNTER — Other Ambulatory Visit: Payer: Self-pay | Admitting: Family Medicine

## 2024-03-16 DIAGNOSIS — I10 Essential (primary) hypertension: Secondary | ICD-10-CM

## 2024-03-22 ENCOUNTER — Ambulatory Visit (INDEPENDENT_AMBULATORY_CARE_PROVIDER_SITE_OTHER): Admitting: Adult Health

## 2024-03-22 ENCOUNTER — Encounter (INDEPENDENT_AMBULATORY_CARE_PROVIDER_SITE_OTHER): Payer: Self-pay | Admitting: Adult Health

## 2024-03-22 VITALS — BP 111/72 | HR 71 | Temp 99.2°F | Ht 66.0 in | Wt 199.0 lb

## 2024-03-22 DIAGNOSIS — E559 Vitamin D deficiency, unspecified: Secondary | ICD-10-CM | POA: Diagnosis not present

## 2024-03-22 DIAGNOSIS — I10 Essential (primary) hypertension: Secondary | ICD-10-CM | POA: Diagnosis not present

## 2024-03-22 DIAGNOSIS — Z6832 Body mass index (BMI) 32.0-32.9, adult: Secondary | ICD-10-CM

## 2024-03-22 DIAGNOSIS — K219 Gastro-esophageal reflux disease without esophagitis: Secondary | ICD-10-CM | POA: Diagnosis not present

## 2024-03-22 DIAGNOSIS — E88819 Insulin resistance, unspecified: Secondary | ICD-10-CM | POA: Diagnosis not present

## 2024-03-22 DIAGNOSIS — Z6833 Body mass index (BMI) 33.0-33.9, adult: Secondary | ICD-10-CM

## 2024-03-22 DIAGNOSIS — E669 Obesity, unspecified: Secondary | ICD-10-CM

## 2024-03-22 MED ORDER — VITAMIN D (ERGOCALCIFEROL) 1.25 MG (50000 UNIT) PO CAPS: 50000.0000 [IU] | ORAL_CAPSULE | ORAL | 0 refills | Status: DC

## 2024-03-22 NOTE — Progress Notes (Signed)
 WEIGHT SUMMARY AND BIOMETRICS  Vitals Temp: 99.2 F (37.3 C) BP: 111/72 Pulse Rate: 71 SpO2: 98 %   Anthropometric Measurements Height: 5' 6 (1.676 m) Weight: 199 lb (90.3 kg) BMI (Calculated): 32.13 Weight at Last Visit: 200lb Weight Lost Since Last Visit: 1lb Weight Gained Since Last Visit: 0lb Starting Weight: 220lb Total Weight Loss (lbs): 21 lb (9.526 kg) Peak Weight: 220lb   Body Composition  Body Fat %: 40.5 % Fat Mass (lbs): 80.8 lbs Muscle Mass (lbs): 112.8 lbs Total Body Water  (lbs): 81 lbs Visceral Fat Rating : 8   Other Clinical Data Fasting: No Labs: no Today's Visit #: 14 Starting Date: 04/14/23    Chief Complaint:   OBESITY Tyisha is here to discuss her progress with her obesity treatment plan.  She is on the the Category 2 Plan and states she is following her eating plan approximately 90 % of the time.  She states she is exercising Walking 15 minutes 1 times per week.  Interim History:  Her insurance will not cover Zepbound   Her insurance required her to establish with [name] for weight management and started her on Wegovy  [name] Provider is Nurse Practitioner and visits are virtual. She had her first injection of Wegovy  0.25mg  on 11/25/2023 She increased Wegovy  0.25mg  to 0.5mg  on/about 01/28/2024 She denies GI upset at present and endorses increased appetite. She plans on discussing increased GLP-1 dose at her next virtual OV  She has been walking less, as her walking buddy has not been at work. She prefers to walk with a friend, does not enjoy listening to PodCasts or Music when walking.  She has a home [place] bike- she stopped using as it caused her to become winded. Rec using [place] bike and exercise at 40-50% intensity until stamina improves.   Subjective:   1. Gastroesophageal reflux disease, unspecified whether esophagitis present 02/08/2024 Gastroeneterology OV Notes: History of Present Illness  GENIYA FULGHAM is a 38  y.o. 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    EGD 07/13/2019: - Normal esophagus s/p dilation and biopsy - Normal stomach - Mucosal changes of the duodenum, patchy whitish nodular mucosa - Duodenal biopsy revealed low-grade follicular lymphoma grade 1/2.  PET scan ordered as well as referral to oncology.   EGD 05/09/2020: - Normal esophagus - Normal stomach - Regression of abnormal duodenal mucosa, rest of 1st, 2nd, and 3rd portion of duodenum unremarkable   Last office visit 10/26/23. Nightly nausea for 4 weeks. Tums not helping. Awakens at night to go vomit. Rare alcohol use. Tries to avoid spicy foods. Only taking zofran  if severe. Occasional constipation. No bloating. Firm stools. Advised to stop pantoprazole  and start nexium  before supper. Trial peppermint tea or ginger prior to bed. Zofran  as needed. Scheduled for EGD.    EGD 12/28/23: - Grade B esophagitis - normal stomach - normal duodenum s/p biopsy (atypical lymphoid infiltrate minimally expands 2 villi) - no lymphoma - Advised PPI BID and follow up with Dr. MARLA - cannot confirm lymphoma present.  2. HTN, goal below 130/80 BP at goal at OV  3. Insulin  resistance Her insurance required her to establish with [name] for weight management and started her on Wegovy  [name] Provider is Nurse Practitioner and visits are virtual. She had her first injection of Wegovy  0.25mg  on 11/25/2023 She increased Wegovy  0.25mg  to 0.5mg  on/about 01/28/2024  She denies GI upset at present and endorses increased appetite. She plans on discussing increased GLP-1 dose at her next virtual OV  4. Vitamin D  deficiency She is on weekly Ergocalciferol - denies N/V/Muscle Weakness  Assessment/Plan:   1. Gastroesophageal reflux disease, unspecified whether esophagitis present Limit known trigger foods  2. HTN, goal  below 130/80 (Primary) Increase regular cardiovascular exercise, ie: walking, [place] Bike  3. Insulin  resistance Increase regular cardiovascular exercise, ie: walking, [place] Bike  4. Vitamin D  deficiency Refill  Vitamin D , Ergocalciferol , (DRISDOL ) 1.25 MG (50000 UNIT) CAPS capsule Take 1 capsule (50,000 Units total) by mouth every 7 (seven) days. Dispense: 4 capsule, Refills: 0 ordered   5. BMI 33.0-33.9,adult Current BMI 32.4  Pranavi is currently in the action stage of change. As such, her goal is to continue with weight loss efforts. She has agreed to the Category 2 Plan.   Exercise goals: All adults should avoid inactivity. Some physical activity is better than none, and adults who participate in any amount of physical activity gain some health benefits. Adults should also include muscle-strengthening activities that involve all major muscle groups on 2 or more days a week. Increase cardiovascular exercise- walking or home [place] Bike at least 3 x week  Behavioral modification strategies: increasing lean protein intake, decreasing simple carbohydrates, increasing vegetables, increasing water  intake, meal planning and cooking strategies, keeping healthy foods in the home, ways to avoid boredom eating, ways to avoid night time snacking, and planning for success.  Kashara has agreed to follow-up with our clinic in 4 weeks. She was informed of the importance of frequent follow-up visits to maximize her success with intensive lifestyle modifications for her multiple health conditions.    Objective:   Blood pressure 111/72, pulse 71, temperature 99.2 F (37.3 C), height 5' 6 (1.676 m), weight 199 lb (90.3 kg), SpO2 98%. Body mass index is 32.12 kg/m.  General: Cooperative, alert, well developed, in no acute distress. HEENT: Conjunctivae and lids unremarkable. Cardiovascular: Regular rhythm.  Lungs: Normal work of breathing. Neurologic: No focal deficits.   Lab Results  Component  Value Date   CREATININE 0.69 12/23/2023   BUN 11 12/23/2023   NA 137 12/23/2023   K 4.3 12/23/2023   CL 100 12/23/2023   CO2 20 12/23/2023   Lab Results  Component Value Date   ALT 15 12/23/2023   AST 17 12/23/2023   ALKPHOS 90 12/23/2023   BILITOT 0.8 12/23/2023   Lab Results  Component Value Date   HGBA1C 5.5 08/24/2023   HGBA1C 5.5 03/31/2023   HGBA1C 5.0 08/15/2016   Lab Results  Component Value Date   INSULIN  16.3 08/24/2023   INSULIN  18.0 03/31/2023   Lab Results  Component Value Date   TSH 0.400 (L) 03/31/2023   Lab Results  Component Value Date   CHOL 186 12/23/2023   HDL 51 12/23/2023   LDLCALC 114 (H) 12/23/2023   TRIG 118 12/23/2023   CHOLHDL 3.6 12/23/2023   Lab Results  Component Value Date   VD25OH 29.8 (L) 08/24/2023   VD25OH 30.3 03/31/2023   Lab Results  Component Value Date   WBC 9.3 12/23/2023   HGB 13.9 12/23/2023   HCT 42.3 12/23/2023   MCV 85 12/23/2023   PLT 243 12/23/2023   No results found for: IRON, TIBC, FERRITIN  Attestation Statements:   Reviewed by clinician on day of visit: allergies, medications, problem list, medical history, surgical history, family history, social history, and previous encounter notes.  I have  reviewed the above documentation for accuracy and completeness, and I agree with the above. -  Equilla Que d. Nyzaiah Kai, NP-C

## 2024-04-21 ENCOUNTER — Encounter: Payer: Self-pay | Admitting: Gastroenterology

## 2024-04-22 ENCOUNTER — Emergency Department (HOSPITAL_COMMUNITY)
Admission: EM | Admit: 2024-04-22 | Discharge: 2024-04-22 | Disposition: A | Discharge status: Home / Self Care | Attending: Emergency Medicine | Admitting: Emergency Medicine

## 2024-04-22 ENCOUNTER — Other Ambulatory Visit: Payer: Self-pay

## 2024-04-22 ENCOUNTER — Emergency Department (HOSPITAL_COMMUNITY)

## 2024-04-22 DIAGNOSIS — Z91040 Latex allergy status: Secondary | ICD-10-CM | POA: Diagnosis not present

## 2024-04-22 DIAGNOSIS — N281 Cyst of kidney, acquired: Secondary | ICD-10-CM | POA: Diagnosis not present

## 2024-04-22 DIAGNOSIS — N1330 Unspecified hydronephrosis: Secondary | ICD-10-CM | POA: Insufficient documentation

## 2024-04-22 DIAGNOSIS — R1031 Right lower quadrant pain: Secondary | ICD-10-CM | POA: Diagnosis not present

## 2024-04-22 DIAGNOSIS — Z90710 Acquired absence of both cervix and uterus: Secondary | ICD-10-CM | POA: Diagnosis not present

## 2024-04-22 LAB — CBC
HCT: 40 % (ref 36.0–46.0)
Hemoglobin: 13.3 g/dL (ref 12.0–15.0)
MCH: 27.5 pg (ref 26.0–34.0)
MCHC: 33.3 g/dL (ref 30.0–36.0)
MCV: 82.8 fL (ref 80.0–100.0)
Platelets: 220 K/uL (ref 150–400)
RBC: 4.83 MIL/uL (ref 3.87–5.11)
RDW: 12.7 % (ref 11.5–15.5)
WBC: 10 K/uL (ref 4.0–10.5)
nRBC: 0 % (ref 0.0–0.2)

## 2024-04-22 LAB — COMPREHENSIVE METABOLIC PANEL WITH GFR
ALT: 17 U/L (ref 0–44)
AST: 24 U/L (ref 15–41)
Albumin: 4.7 g/dL (ref 3.5–5.0)
Alkaline Phosphatase: 80 U/L (ref 38–126)
Anion gap: 12 (ref 5–15)
BUN: 11 mg/dL (ref 6–20)
CO2: 25 mmol/L (ref 22–32)
Calcium: 9.8 mg/dL (ref 8.9–10.3)
Chloride: 102 mmol/L (ref 98–111)
Creatinine, Ser: 0.74 mg/dL (ref 0.44–1.00)
GFR, Estimated: >60 mL/min (ref >60)
Glucose, Bld: 89 mg/dL (ref 70–99)
Potassium: 4.1 mmol/L (ref 3.5–5.1)
Sodium: 138 mmol/L (ref 135–145)
Total Bilirubin: 0.6 mg/dL (ref 0.0–1.2)
Total Protein: 7.8 g/dL (ref 6.5–8.1)

## 2024-04-22 LAB — URINALYSIS, ROUTINE W REFLEX MICROSCOPIC
Bilirubin Urine: NEGATIVE
Glucose, UA: NEGATIVE mg/dL
Ketones, ur: NEGATIVE mg/dL
Leukocytes,Ua: NEGATIVE
Nitrite: NEGATIVE
Protein, ur: NEGATIVE mg/dL
Specific Gravity, Urine: 1.009 (ref 1.005–1.030)
pH: 7 (ref 5.0–8.0)

## 2024-04-22 LAB — LIPASE, BLOOD: Lipase: 48 U/L (ref 11–51)

## 2024-04-22 MED ORDER — IOHEXOL 300 MG/ML  SOLN
100.0000 mL | Freq: Once | INTRAMUSCULAR | Status: AC | PRN
  Administered 2024-04-22: 100 mL via INTRAVENOUS

## 2024-04-22 MED ORDER — ONDANSETRON HCL 4 MG/2ML IJ SOLN: 4.0000 mg | Freq: Once | INTRAMUSCULAR | Status: DC | PRN

## 2024-04-22 MED ORDER — OXYCODONE-ACETAMINOPHEN 5-325 MG PO TABS: 1.0000 | ORAL_TABLET | Freq: Four times a day (QID) | ORAL | 0 refills | Status: AC | PRN

## 2024-04-22 MED ORDER — TAMSULOSIN HCL 0.4 MG PO CAPS: 0.4000 mg | ORAL_CAPSULE | Freq: Every day | ORAL | 0 refills | Status: DC

## 2024-04-22 NOTE — ED Triage Notes (Signed)
 Pt arrived POV with c/ abd pain that radiates around to her right flank since 4 am this morning.

## 2024-04-22 NOTE — ED Provider Notes (Signed)
 [place] EMERGENCY DEPARTMENT AT [facility] Provider Note   CSN: [id number] Arrival date & time: 04/22/24  9160     Patient presents with: Abdominal Pain and Flank Pain   Betty Hayes is a 38 y.o. female.  She is here with a complaint of right lower quadrant abdominal pain that is fine going on since 4 AM.  She rates it as 4 out of 10 if she is not moving but worsens with any activity or palpation.  Associated with some loose stools and nausea.  No urinary symptoms vaginal bleeding or discharge.  She has a prior hysterectomy, still has her ovaries and had surgery on her right ureter.  Has had kidney stones in the past although this feels different.  Has tried nothing for her symptoms.   The history is provided by the spouse and the patient.  Abdominal Pain Pain location:  RLQ Pain quality: aching   Pain radiates to:  Does not radiate Pain severity:  Moderate Onset quality:  Sudden Duration:  5 hours Timing:  Constant Progression:  Unchanged Chronicity:  New Context: not trauma   Relieved by:  Nothing Worsened by:  Movement and palpation Ineffective treatments:  None tried Associated symptoms: diarrhea and nausea   Associated symptoms: no constipation, no cough, no fever and no vomiting        Prior to Admission medications   Medication Sig Start Date End Date Taking? Authorizing Provider  albuterol  (VENTOLIN  HFA) 108 (90 Base) MCG/ACT inhaler TAKE 2 PUFFS BY MOUTH EVERY 6 HOURS AS NEEDED FOR WHEEZE OR SHORTNESS OF BREATH 11/03/22   Dettinger, Fonda LABOR, MD  Ascorbic Acid (VITAMIN C PO) Take 1 tablet by mouth daily as needed (immune support).    [provider]  Cyanocobalamin  (VITAMIN B-12 PO) Take 1 tablet by mouth in the morning.    [provider]  DTx App - Wellness ([name] TIER 3 OBESITY) MISC  12/19/23   [provider]  ECHINACEA PO Take 760 mg by mouth daily as needed (immune support).    [provider]   esomeprazole  (NEXIUM ) 40 MG capsule Take 1 capsule (40 mg total) by mouth 2 (two) times daily before a meal. 12/28/23   Rourk, Lamar HERO, MD  hydrOXYzine  (VISTARIL ) 25 MG capsule Take 1 capsule (25 mg total) by mouth every 8 (eight) hours as needed. 12/23/23   Dettinger, Fonda LABOR, MD  levothyroxine  (SYNTHROID ) 88 MCG tablet Take 88 mcg by mouth. 11/11/23   [provider]  liothyronine  (CYTOMEL ) 5 MCG tablet Take 5 mcg by mouth daily. 11/11/23   [provider]  lisinopril  (ZESTRIL ) 5 MG tablet TAKE 1 TABLET (5 MG TOTAL) BY MOUTH DAILY. 03/16/24   Dettinger, Fonda LABOR, MD  MAGNESIUM  GLUCONATE PO Take 120 mg by mouth every evening.    [provider]  Multiple Vitamins-Minerals (ZINC) LOZG Take 1 lozenge by mouth daily as needed (immune support).    [provider]  ondansetron  (ZOFRAN ) 4 MG tablet Take 1 tablet (4 mg total) by mouth daily as needed for nausea or vomiting. 07/31/23 07/30/24  Devere Lonni Righter, MD  Selenium 200 MCG CAPS Take 200 mcg by mouth in the morning.    [provider]  Semaglutide -Weight Management 0.5 MG/0.5ML SOAJ Inject 0.5 mg into the skin once a week. 02/23/24   Danford, Rockie D, NP  Vitamin D , Ergocalciferol , (DRISDOL ) 1.25 MG (50000 UNIT) CAPS capsule Take 1 capsule (50,000 Units total) by mouth every 7 (  seven) days. 03/22/24   Danford, Rockie D, NP    Allergies: Latex    Review of Systems  Constitutional:  Negative for fever.  Respiratory:  Negative for cough.   Gastrointestinal:  Positive for abdominal pain, diarrhea and nausea. Negative for constipation and vomiting.    Updated Vital Signs BP 122/87   Pulse 82   Temp 98.3 F (36.8 C) (Oral)   Resp 18   Ht 5' 8 (1.727 m)   Wt 86.2 kg   SpO2 97%   BMI 28.89 kg/m   Physical Exam Vitals and nursing note reviewed.  Constitutional:      General: She is not in acute distress.    Appearance: Normal appearance. She is well-developed.  HENT:     Head: Normocephalic  and atraumatic.  Eyes:     Conjunctiva/sclera: Conjunctivae normal.  Cardiovascular:     Rate and Rhythm: Normal rate and regular rhythm.     Heart sounds: No murmur heard. Pulmonary:     Effort: Pulmonary effort is normal. No respiratory distress.     Breath sounds: Normal breath sounds. No stridor. No wheezing.  Abdominal:     Palpations: Abdomen is soft.     Tenderness: There is abdominal tenderness in the right lower quadrant. There is no guarding or rebound.  Musculoskeletal:        General: No deformity.     Cervical back: Neck supple.  Skin:    General: Skin is warm and dry.  Neurological:     General: No focal deficit present.     Mental Status: She is alert.     GCS: GCS eye subscore is 4. GCS verbal subscore is 5. GCS motor subscore is 6.     Gait: Gait normal.     (all labs ordered are listed, but only abnormal results are displayed) Labs Reviewed  URINALYSIS, ROUTINE W REFLEX MICROSCOPIC - Abnormal; Notable for the following components:      Result Value   Color, Urine STRAW (*)    Hgb urine dipstick MODERATE (*)    Bacteria, UA RARE (*)    All other components within normal limits  LIPASE, BLOOD  COMPREHENSIVE METABOLIC PANEL WITH GFR  CBC    EKG: None  Radiology: CT ABDOMEN PELVIS W CONTRAST Result Date: 04/22/2024 CLINICAL DATA:  Acute right flank pain. EXAM: CT ABDOMEN AND PELVIS WITH CONTRAST TECHNIQUE: Multidetector CT imaging of the abdomen and pelvis was performed using the standard protocol following bolus administration of intravenous contrast. RADIATION DOSE REDUCTION: This exam was performed according to the departmental dose-optimization program which includes automated exposure control, adjustment of the mA and/or kV according to patient size and/or use of iterative reconstruction technique. CONTRAST:  OMNIPAQUE  IOHEXOL  300 MG/ML  SOLN COMPARISON:  May 22, 2023. FINDINGS: Lower chest: No acute abnormality. Hepatobiliary: No focal liver  abnormality is seen. No gallstones, gallbladder wall thickening, or biliary dilatation. Pancreas: Unremarkable. No pancreatic ductal dilatation or surrounding inflammatory changes. Spleen: Normal in size without focal abnormality. Adrenals/Urinary Tract: Adrenal glands appear normal. Stable bilateral renal cysts are noted. Mild right hydroureteronephrosis is noted without obstructing calculus. Urinary bladder is unremarkable. Stomach/Bowel: Stomach is within normal limits. Appendix appears normal. No evidence of bowel wall thickening, distention, or inflammatory changes. Vascular/Lymphatic: No significant vascular findings are present. No enlarged abdominal or pelvic lymph nodes. Reproductive: Status post hysterectomy. No adnexal masses. Other: No abdominal wall hernia or abnormality. No abdominopelvic ascites. Musculoskeletal: No acute or significant osseous findings. IMPRESSION: Mild  right hydroureteronephrosis is noted without obstructing calculus. This may be due to recently passed calculus or possibly pyelonephritis. Electronically Signed   By: Lynwood Landy Raddle M.D.   On: 04/22/2024 11:01     Procedures   Medications Ordered in the ED  ondansetron  (ZOFRAN ) injection 4 mg (has no administration in time range)  iohexol  (OMNIPAQUE ) 300 MG/ML solution 100 mL (100 mLs Intravenous Contrast Given 04/22/24 1043)    Clinical Course as of 04/22/24 1638  Fri Apr 22, 2024  0914 Patient declining any pain medicine at this time [MB]  1158 CT ABDOMEN PELVIS W CONTRAST [MB]    Clinical Course User Index [MB] Towana Ozell BROCKS, MD                                 Medical Decision Making Amount and/or Complexity of Data Reviewed Labs: ordered. Radiology: ordered. Decision-making details documented in ED Course.  Risk Prescription drug management.   This patient complains of right lower quadrant pain and nausea; this involves an extensive number of treatment Options and is a complaint that carries with  it a high risk of complications and morbidity. The differential includes kidney stone, diverticulitis, appendicitis, pyelonephritis, ovarian cyst  I ordered, reviewed and interpreted labs, which included CBC normal chemistries and LFTs normal urinalysis without clear signs of infection I ordered medication nausea medication and reviewed PMP when indicated. I ordered imaging studies which included CT abdomen and pelvis and I independently    visualized and interpreted imaging which showed mild hydronephrosis no clear stone Additional history obtained from patient significant other Previous records obtained and reviewed in epic including prior PCP and GI notes Cardiac monitoring reviewed, sinus rhythm Social determinants considered, no significant barriers Critical Interventions: None  After the interventions stated above, I reevaluated the patient and found patient's pain is resolved and feeling better Admission and further testing considered, she is comfortable plan for discharge and outpatient follow-up with her urology team.  Due to unclear level of hydro and possible missed stone will cover with pain medicine and Flomax.  Return instructions discussed      Final diagnoses:  Hydronephrosis, right    ED Discharge Orders          Ordered    oxyCODONE -acetaminophen  (PERCOCET/ROXICET) 5-325 MG tablet  Every 6 hours PRN        04/22/24 1203    tamsulosin (FLOMAX) 0.4 MG CAPS capsule  Daily        04/22/24 1203               Towana Ozell BROCKS, MD 04/22/24 1646

## 2024-04-22 NOTE — Discharge Instructions (Signed)
 You were seen in the emergency department for some right sided lower abdominal pain.  Your CAT scan showed some swelling in the tube going from your kidney to your bladder.  There is no signs of appendicitis or other findings to explain your symptoms.  We are prescribing you a short course of some pain medicine and some medicine to help passage of a possible kidney stone.  Follow-up with your urologist.  Return if any worsening or concerning symptoms

## 2024-04-25 ENCOUNTER — Ambulatory Visit (INDEPENDENT_AMBULATORY_CARE_PROVIDER_SITE_OTHER): Admitting: Adult Health

## 2024-04-25 ENCOUNTER — Encounter (INDEPENDENT_AMBULATORY_CARE_PROVIDER_SITE_OTHER): Payer: Self-pay | Admitting: Adult Health

## 2024-04-25 VITALS — BP 99/64 | HR 89 | Temp 98.6°F | Ht 66.0 in | Wt 192.0 lb

## 2024-04-25 DIAGNOSIS — I10 Essential (primary) hypertension: Secondary | ICD-10-CM | POA: Diagnosis not present

## 2024-04-25 DIAGNOSIS — Z6831 Body mass index (BMI) 31.0-31.9, adult: Secondary | ICD-10-CM

## 2024-04-25 DIAGNOSIS — E559 Vitamin D deficiency, unspecified: Secondary | ICD-10-CM

## 2024-04-25 DIAGNOSIS — Z6833 Body mass index (BMI) 33.0-33.9, adult: Secondary | ICD-10-CM

## 2024-04-25 DIAGNOSIS — E88819 Insulin resistance, unspecified: Secondary | ICD-10-CM | POA: Diagnosis not present

## 2024-04-25 DIAGNOSIS — N1330 Unspecified hydronephrosis: Secondary | ICD-10-CM

## 2024-04-25 MED ORDER — VITAMIN D (ERGOCALCIFEROL) 1.25 MG (50000 UNIT) PO CAPS: 50000.0000 [IU] | ORAL_CAPSULE | ORAL | 0 refills | Status: DC

## 2024-04-25 NOTE — Progress Notes (Signed)
 WEIGHT SUMMARY AND BIOMETRICS  Vitals Temp: 98.6 F (37 C) BP: 99/64 Pulse Rate: 89 SpO2: 98 %   Anthropometric Measurements Height: 5' 6 (1.676 m) Weight: 192 lb (87.1 kg) BMI (Calculated): 31 Weight at Last Visit: 199lb Weight Lost Since Last Visit: 7lb Weight Gained Since Last Visit: 0lb Starting Weight: 220lb Total Weight Loss (lbs): 20 lb (9.072 kg) Peak Weight: 220lb   Body Composition  Body Fat %: 37.9 % Fat Mass (lbs): 73 lbs Muscle Mass (lbs): 113.6 lbs Total Body Water  (lbs): 76 lbs Visceral Fat Rating : 8   Other Clinical Data Fasting: No Labs: No Today's Visit #: 15 Starting Date: 04/14/23    Chief Complaint:   OBESITY Betty Hayes is here to discuss her progress with her obesity treatment plan.  She is on the the Category 2 Plan and states she is following her eating plan approximately 75 % of the time.  She states she is exercising: NONE  Interim History:  Wellsite geologist is Publishing rights manager and visits are virtual. She had her first injection of Wegovy  0.25mg  on 11/25/2023 She increased Wegovy  0.25mg  to 0.5mg  on/about 01/28/2024 04/08/2024 Wegovy  0.5mg  to 1mg    She reports fatigue, nausea without vomiting since increase. She states I fell like crap She will have f/u with [facility] Provider 05/16/24- encouraged her to contact [place] provider sooner with recent/worsening SE  04/22/2024 ED Encounter for Hydronephrosis Reviewed ED notes, imaging, and labs She reports resolution of acute GU sx's  Subjective:   1. Vitamin D  deficiency She is on weekly Ergocalciferol - denies N/V/Muscle Weakness  2. HTN, goal below 130/80 BP at goal at OV She is on low dose Lisinopril  5mg  She denies ACE cough  3. Insulin  resistance [name] Provider is Nurse Practitioner and visits are virtual. She had her first injection of Wegovy  0.25mg  on 11/25/2023 She increased Wegovy  0.25mg  to 0.5mg  on/about 01/28/2024 04/08/2024 Wegovy  0.5mg  to 1mg    She reports fatigue, nausea  without vomiting since increase. She states I fell like crap She will have f/u with [facility] Provider 05/16/24- encouraged her to contact [place] provider sooner with recent/worsening SE  4. Hydronephrosis, unspecified hydronephrosis type  Narrative & Impression  CLINICAL DATA:  Acute right flank pain.   EXAM: CT ABDOMEN AND PELVIS WITH CONTRAST   TECHNIQUE: Multidetector CT imaging of the abdomen and pelvis was performed using the standard protocol following bolus administration of intravenous contrast.   RADIATION DOSE REDUCTION: This exam was performed according to the departmental dose-optimization program which includes automated exposure control, adjustment of the mA and/or kV according to patient size and/or use of iterative reconstruction technique.   CONTRAST:  OMNIPAQUE  IOHEXOL  300 MG/ML  SOLN   COMPARISON:  May 22, 2023.   FINDINGS: Lower chest: No acute abnormality.   Hepatobiliary: No focal liver abnormality is seen. No gallstones, gallbladder wall thickening, or biliary dilatation.   Pancreas: Unremarkable. No pancreatic ductal dilatation or surrounding inflammatory changes.   Spleen: Normal in size without focal abnormality.   Adrenals/Urinary Tract: Adrenal glands appear normal. Stable bilateral renal cysts are noted. Mild right hydroureteronephrosis is noted without obstructing calculus. Urinary bladder is unremarkable.   Stomach/Bowel: Stomach is within normal limits. Appendix appears normal. No evidence of bowel wall thickening, distention, or inflammatory changes.   Vascular/Lymphatic: No significant vascular findings are present. No enlarged abdominal or pelvic lymph nodes.   Reproductive: Status post hysterectomy. No adnexal masses.   Other: No abdominal wall hernia or abnormality. No abdominopelvic ascites.  Musculoskeletal: No acute or significant osseous findings.   IMPRESSION: Mild right hydroureteronephrosis is noted without  obstructing calculus. This may be due to recently passed calculus or possibly pyelonephritis.   After the interventions stated above, I reevaluated the patient and found patient's pain is resolved and feeling better Admission and further testing considered, she is comfortable plan for discharge and outpatient follow-up with her urology team.  Due to unclear level of hydro and possible missed stone will cover with pain medicine and Flomax.  Return instructions discussed  Assessment/Plan:   1. Vitamin D  deficiency (Primary) Refill Vitamin D , Ergocalciferol , (DRISDOL ) 1.25 MG (50000 UNIT) CAPS capsule Take 1 capsule (50,000 Units total) by mouth every 7 (seven) days. Dispense: 4 capsule, Refills: 0 ordered   2. HTN, goal below 130/80 Continue healthy eating and increase daily activity. Resume [place] use, at least twice weekly.  3. Insulin  resistance F/u with [name] Provider and share recent SE with increased GLP-1 therapy  4. Hydronephrosis, unspecified hydronephrosis type Remain well hydrated with water  F/u with PCP as needed  5. BMI 33.0-33.9,adult Current BMI 31.1  Betty Hayes is currently in the action stage of change. As such, her goal is to continue with weight loss efforts. She has agreed to the Category 2 Plan.   Exercise goals: All adults should avoid inactivity. Some physical activity is better than none, and adults who participate in any amount of physical activity gain some health benefits. Adults should also include muscle-strengthening activities that involve all major muscle groups on 2 or more days a week. At least turn on/get on [place] Bike 2 x week  Behavioral modification strategies: increasing lean protein intake, decreasing simple carbohydrates, increasing vegetables, increasing water  intake, no skipping meals, meal planning and cooking strategies, keeping healthy foods in the home, ways to avoid boredom eating, and planning for success.  Betty Hayes has agreed to follow-up  with our clinic in 4 weeks. She was informed of the importance of frequent follow-up visits to maximize her success with intensive lifestyle modifications for her multiple health conditions.   F/u with [name] Provider and share recent SE with increased GLP-1 therapy  Objective:   Blood pressure 99/64, pulse 89, temperature 98.6 F (37 C), height 5' 6 (1.676 m), weight 192 lb (87.1 kg), SpO2 98%. Body mass index is 30.99 kg/m.  General: Cooperative, alert, well developed, in no acute distress. HEENT: Conjunctivae and lids unremarkable. Cardiovascular: Regular rhythm.  Lungs: Normal work of breathing. Neurologic: No focal deficits.   Lab Results  Component Value Date   CREATININE 0.74 04/22/2024   BUN 11 04/22/2024   NA 138 04/22/2024   K 4.1 04/22/2024   CL 102 04/22/2024   CO2 25 04/22/2024   Lab Results  Component Value Date   ALT 17 04/22/2024   AST 24 04/22/2024   ALKPHOS 80 04/22/2024   BILITOT 0.6 04/22/2024   Lab Results  Component Value Date   HGBA1C 5.5 08/24/2023   HGBA1C 5.5 03/31/2023   HGBA1C 5.0 08/15/2016   Lab Results  Component Value Date   INSULIN  16.3 08/24/2023   INSULIN  18.0 03/31/2023   Lab Results  Component Value Date   TSH 0.400 (L) 03/31/2023   Lab Results  Component Value Date   CHOL 186 12/23/2023   HDL 51 12/23/2023   LDLCALC 114 (H) 12/23/2023   TRIG 118 12/23/2023   CHOLHDL 3.6 12/23/2023   Lab Results  Component Value Date   VD25OH 29.8 (L) 08/24/2023   VD25OH 30.3 03/31/2023  Lab Results  Component Value Date   WBC 10.0 04/22/2024   HGB 13.3 04/22/2024   HCT 40.0 04/22/2024   MCV 82.8 04/22/2024   PLT 220 04/22/2024   No results found for: IRON, TIBC, FERRITIN  Attestation Statements:   Reviewed by clinician on day of visit: allergies, medications, problem list, medical history, surgical history, family history, social history, and previous encounter notes.  Time spent on visit including pre-visit chart  review and post-visit care and charting was 26 minutes.   I have reviewed the above documentation for accuracy and completeness, and I agree with the above. -  Rydge [place]idor d. Ramey Ketcherside, NP-C

## 2024-04-27 ENCOUNTER — Other Ambulatory Visit (INDEPENDENT_AMBULATORY_CARE_PROVIDER_SITE_OTHER): Payer: Self-pay | Admitting: Adult Health

## 2024-04-27 DIAGNOSIS — E559 Vitamin D deficiency, unspecified: Secondary | ICD-10-CM

## 2024-05-23 ENCOUNTER — Encounter (INDEPENDENT_AMBULATORY_CARE_PROVIDER_SITE_OTHER): Payer: Self-pay | Admitting: Adult Health

## 2024-05-23 ENCOUNTER — Ambulatory Visit (INDEPENDENT_AMBULATORY_CARE_PROVIDER_SITE_OTHER): Payer: Self-pay | Admitting: Adult Health

## 2024-05-23 VITALS — BP 106/72 | HR 99 | Temp 98.3°F | Ht 66.0 in | Wt 188.0 lb

## 2024-05-23 DIAGNOSIS — Z6830 Body mass index (BMI) 30.0-30.9, adult: Secondary | ICD-10-CM

## 2024-05-23 DIAGNOSIS — I10 Essential (primary) hypertension: Secondary | ICD-10-CM

## 2024-05-23 DIAGNOSIS — Z6833 Body mass index (BMI) 33.0-33.9, adult: Secondary | ICD-10-CM

## 2024-05-23 DIAGNOSIS — E559 Vitamin D deficiency, unspecified: Secondary | ICD-10-CM | POA: Diagnosis not present

## 2024-05-23 DIAGNOSIS — E88819 Insulin resistance, unspecified: Secondary | ICD-10-CM | POA: Diagnosis not present

## 2024-05-23 MED ORDER — VITAMIN D (ERGOCALCIFEROL) 1.25 MG (50000 UNIT) PO CAPS: 50000.0000 [IU] | ORAL_CAPSULE | ORAL | 0 refills | Status: DC

## 2024-05-23 NOTE — Progress Notes (Signed)
 WEIGHT SUMMARY AND BIOMETRICS  Vitals Temp: 98.3 F (36.8 C) BP: 106/72 Pulse Rate: 99 SpO2: 99 %   Anthropometric Measurements Height: 5' 6 (1.676 m) Weight: 188 lb (85.3 kg) BMI (Calculated): 30.36 Weight at Last Visit: 192lb Weight Lost Since Last Visit: 4lb Weight Gained Since Last Visit: 0lb Starting Weight: 220lb Total Weight Loss (lbs): 24 lb (10.9 kg) Peak Weight: 220lb   Body Composition  Body Fat %: 37.9 % Fat Mass (lbs): 71.4 lbs Muscle Mass (lbs): 111 lbs Total Body Water  (lbs): 76.4 lbs Visceral Fat Rating : 7   Other Clinical Data Fasting: No Labs: No Today's Visit #: 16 Starting Date: 04/14/23    Chief Complaint:   OBESITY Betty Hayes is here to discuss her progress with her obesity treatment plan.  She is on the the Category 2 Plan and states she is following her eating plan approximately 90 % of the time.  She states she is exercising:None  Interim History:  Wellsite Geologist is Publishing Rights Manager and visits are virtual. She had her first injection of Wegovy  0.25mg  on 11/25/2023 She increased Wegovy  0.25mg  to 0.5mg  on/about 01/28/2024 04/08/2024 Wegovy  0.5mg  to 1mg  - experienced nausea without vomiting and feeling generally miserable. Virtual OV with [employer] on 05/16/2024 [facility] Provider suggested increasing electrolytes the day prior and day of injection. She is still using PRN Zofran - refilled by online NP-C  Due to feeling poorly r/t GLP-1 therapy, she has not had the energy to exercise- discussed strategies to incorporate regular exercise/activity   She lives with her husband, young adult children (ages 68 and 52).  Subjective:   1. Vitamin D  deficiency  Latest Reference Range & Units 08/24/23 12:52  Vitamin D , 25-Hydroxy 30.0 - 100.0 ng/mL 29.8 (L)  (L): Data is abnormally low  She is on weekly Ergocalciferol - denies N/V/Muscle Weakness  2. HTN, goal below 130/80 BP at goal at OV She is on daily lisinopril  (ZESTRIL ) 5 MG  tablet   3. Insulin  resistance Betty Hayes Provider is Nurse Practitioner and visits are virtual. She had her first injection of Wegovy  0.25mg  on 11/25/2023 She increased Wegovy  0.25mg  to 0.5mg  on/about 01/28/2024 04/08/2024 Wegovy  increase from 0.5mg  to 1mg    Assessment/Plan:   1. Vitamin D  deficiency (Primary) Refill  Vitamin D , Ergocalciferol , (DRISDOL ) 1.25 MG (50000 UNIT) CAPS capsule Take 1 capsule (50,000 Units total) by mouth every 7 (seven) days. Dispense: 4 capsule, Refills: 0 ordered   2. HTN, goal below 130/80 Continue healthy eating and increase regular exercise  3. Insulin  resistance Continue healthy eating and increase regular exercise  4. BMI 33.0-33.9,adult Current BMI 30.4  Betty Hayes is currently in the action stage of change. As such, her goal is to continue with weight loss efforts. She has agreed to the Category 2 Plan.   Exercise goals: All adults should avoid inactivity. Some physical activity is better than none, and adults who participate in any amount of physical activity gain some health benefits. Adults should also include muscle-strengthening activities that involve all major muscle groups on 2 or more days a week.  Behavioral modification strategies: increasing lean protein intake, decreasing simple carbohydrates, increasing vegetables, increasing water  intake, meal planning and cooking strategies, keeping healthy foods in the home, ways to avoid boredom eating, better snacking choices, and planning for success.  Betty Hayes has agreed to follow-up with our clinic in 4 weeks. She was informed of the importance of frequent follow-up visits to maximize her success with intensive lifestyle modifications for her multiple health  conditions.   Check A1c and Vit D Level at next OV Check Fasting Labs in Jan 2026  Objective:   Blood pressure 106/72, pulse 99, temperature 98.3 F (36.8 C), height 5' 6 (1.676 m), weight 188 lb (85.3 kg), SpO2 99%. Body mass index is 30.34  kg/m.  General: Cooperative, alert, well developed, in no acute distress. HEENT: Conjunctivae and lids unremarkable. Cardiovascular: Regular rhythm.  Lungs: Normal work of breathing. Neurologic: No focal deficits.   Lab Results  Component Value Date   CREATININE 0.74 04/22/2024   BUN 11 04/22/2024   NA 138 04/22/2024   K 4.1 04/22/2024   CL 102 04/22/2024   CO2 25 04/22/2024   Lab Results  Component Value Date   ALT 17 04/22/2024   AST 24 04/22/2024   ALKPHOS 80 04/22/2024   BILITOT 0.6 04/22/2024   Lab Results  Component Value Date   HGBA1C 5.5 08/24/2023   HGBA1C 5.5 03/31/2023   HGBA1C 5.0 08/15/2016   Lab Results  Component Value Date   INSULIN  16.3 08/24/2023   INSULIN  18.0 03/31/2023   Lab Results  Component Value Date   TSH 0.400 (L) 03/31/2023   Lab Results  Component Value Date   CHOL 186 12/23/2023   HDL 51 12/23/2023   LDLCALC 114 (H) 12/23/2023   TRIG 118 12/23/2023   CHOLHDL 3.6 12/23/2023   Lab Results  Component Value Date   VD25OH 29.8 (L) 08/24/2023   VD25OH 30.3 03/31/2023   Lab Results  Component Value Date   WBC 10.0 04/22/2024   HGB 13.3 04/22/2024   HCT 40.0 04/22/2024   MCV 82.8 04/22/2024   PLT 220 04/22/2024   No results found for: IRON, TIBC, FERRITIN  Attestation Statements:   Reviewed by clinician on day of visit: allergies, medications, problem list, medical history, surgical history, family history, social history, and previous encounter notes.  I have reviewed the above documentation for accuracy and completeness, and I agree with the above. -  Oreatha Fabry d. Ilai Hiller, NP-C

## 2024-05-29 ENCOUNTER — Other Ambulatory Visit (INDEPENDENT_AMBULATORY_CARE_PROVIDER_SITE_OTHER): Payer: Self-pay | Admitting: Adult Health

## 2024-05-29 DIAGNOSIS — E559 Vitamin D deficiency, unspecified: Secondary | ICD-10-CM

## 2024-06-19 ENCOUNTER — Other Ambulatory Visit: Payer: Self-pay | Admitting: Family Medicine

## 2024-06-19 DIAGNOSIS — I10 Essential (primary) hypertension: Secondary | ICD-10-CM

## 2024-06-20 ENCOUNTER — Ambulatory Visit (INDEPENDENT_AMBULATORY_CARE_PROVIDER_SITE_OTHER): Payer: Self-pay | Admitting: Adult Health

## 2024-06-20 ENCOUNTER — Encounter (INDEPENDENT_AMBULATORY_CARE_PROVIDER_SITE_OTHER): Payer: Self-pay | Admitting: Adult Health

## 2024-06-20 VITALS — BP 91/63 | HR 77 | Temp 98.3°F | Ht 66.0 in | Wt 187.0 lb

## 2024-06-20 DIAGNOSIS — E88819 Insulin resistance, unspecified: Secondary | ICD-10-CM

## 2024-06-20 DIAGNOSIS — R110 Nausea: Secondary | ICD-10-CM

## 2024-06-20 DIAGNOSIS — E669 Obesity, unspecified: Secondary | ICD-10-CM

## 2024-06-20 DIAGNOSIS — E559 Vitamin D deficiency, unspecified: Secondary | ICD-10-CM

## 2024-06-20 DIAGNOSIS — I10 Essential (primary) hypertension: Secondary | ICD-10-CM | POA: Diagnosis not present

## 2024-06-20 DIAGNOSIS — Z6833 Body mass index (BMI) 33.0-33.9, adult: Secondary | ICD-10-CM

## 2024-06-20 DIAGNOSIS — Z6830 Body mass index (BMI) 30.0-30.9, adult: Secondary | ICD-10-CM

## 2024-06-20 MED ORDER — VITAMIN D (ERGOCALCIFEROL) 1.25 MG (50000 UNIT) PO CAPS: 50000.0000 [IU] | ORAL_CAPSULE | ORAL | 0 refills | Status: DC

## 2024-06-20 NOTE — Progress Notes (Unsigned)
 WEIGHT SUMMARY AND BIOMETRICS  Vitals Temp: 98.3 F (36.8 C) BP: 91/63 Pulse Rate: 77 SpO2: 99 %   Anthropometric Measurements Height: 5' 6 (1.676 m) Weight: 187 lb (84.8 kg) BMI (Calculated): 30.2 Weight at Last Visit: 188lb Weight Lost Since Last Visit: 1lb Weight Gained Since Last Visit: 0lb Starting Weight: 220lb Total Weight Loss (lbs): 25 lb (11.3 kg) Peak Weight: 220lb   Body Composition  Body Fat %: 37.6 % Fat Mass (lbs): 70.6 lbs Muscle Mass (lbs): 111 lbs Total Body Water  (lbs): 76.2 lbs Visceral Fat Rating : 7   Other Clinical Data Fasting: No Labs: No Today's Visit #: 17 Starting Date: 04/14/23    Chief Complaint:   OBESITY Betty Hayes is here to discuss her progress with her obesity treatment plan.  She is on the the Category 2 Plan and states she is following her eating plan approximately 50 % of the time.  She states she is exercising Betty Hayes Bike 30 minutes 1 times per week.  Interim History:  Wegovy  1mg  caused significant nausea with and without vomiting. She held her weekly dose then restarted at Wegovy  0.5mg  on 06/06/2024 She will administer Wegovy  later tonight. She reports resolution of GI upset the last 4 days. She will have chronic f/u with [name] Provider/ Nurse Practitioner next week.  Currently, she denies mass in neck, dysphagia, dyspepsia, persistent hoarseness, abdominal pain, or N/V/C   Subjective:   1. HTN, goal below 130/80 BP soft at OV She denies sx's of hypotension  2. Vitamin D  deficiency She is on weekly Ergocalciferol - denies N/V/Muscle Weakness She endorses stable energy levels that last 3 weeks  3. Insulin  resistance Wegovy  1mg  caused significant nausea with and without vomiting. She held her weekly dose then restarted at Wegovy  0.5mg  on 06/06/2024 She will administer Wegovy  later tonight. She reports resolution of GI upset the last 4 days. She will have chronic f/u with [name] Provider/ Nurse Practitioner  next week.  Currently, she denies mass in neck, dysphagia, dyspepsia, persistent hoarseness, abdominal pain, or N/V/C   4. Nausea without vomiting Wegovy  1mg  caused significant nausea with and without vomiting. She held her weekly dose then restarted at Wegovy  0.5mg  on 06/06/2024 She will administer Wegovy  later tonight. She reports resolution of GI upset the last 4 days. She will have chronic f/u with [name] Provider/ Nurse Practitioner next week.  She has not required PRN Zofran  since last week.  Assessment/Plan:   1. HTN, goal below 130/80 (Primary) Limit Na+ Increase regular cardiovascular exercise  2. Vitamin D  deficiency Refill - Vitamin D , Ergocalciferol , (DRISDOL ) 1.25 MG (50000 UNIT) CAPS capsule; Take 1 capsule (50,000 Units total) by mouth every 7 (seven) days.  Dispense: 4 capsule; Refill: 0 Check Labs - VITAMIN D  25 Hydroxy (Vit-D Deficiency, Fractures)  3. Insulin  resistance Check Labs - Hemoglobin A1c  4. Nausea without vomiting Stay on reduced dose of Wegovy  Do not overeat F/u with GLP-1 prescriber  5. BMI 33.0-33.9,adult Current BMI 30.3  Betty Hayes is currently in the action stage of change. As such, her goal is to continue with weight loss efforts. She has agreed to the Category 2 Plan.   Exercise goals: For substantial health benefits, adults should do at least 150 minutes (2 hours and 30 minutes) a week of moderate-intensity, or 75 minutes (1 hour and 15 minutes) a week of vigorous-intensity aerobic physical activity, or an equivalent combination of moderate- and vigorous-intensity aerobic activity. Aerobic activity should be performed in episodes of at least  10 minutes, and preferably, it should be spread throughout the week.  Behavioral modification strategies: increasing lean protein intake, decreasing simple carbohydrates, increasing vegetables, increasing water  intake, no skipping meals, meal planning and cooking strategies, keeping healthy foods in the  home, better snacking choices, holiday eating strategies , and planning for success.  Betty Hayes has agreed to follow-up with our clinic in 4 weeks. She was informed of the importance of frequent follow-up visits to maximize her success with intensive lifestyle modifications for her multiple health conditions.   Betty Hayes was informed we would discuss her lab results at her next visit unless there is a critical issue that needs to be addressed sooner. Betty Hayes agreed to keep her next visit at the agreed upon time to discuss these results.  Objective:   Blood pressure 91/63, pulse 77, temperature 98.3 F (36.8 C), height 5' 6 (1.676 m), weight 187 lb (84.8 kg), SpO2 99%. Body mass index is 30.18 kg/m.  General: Cooperative, alert, well developed, in no acute distress. HEENT: Conjunctivae and lids unremarkable. Cardiovascular: Regular rhythm.  Lungs: Normal work of breathing. Neurologic: No focal deficits.   Lab Results  Component Value Date   CREATININE 0.74 04/22/2024   BUN 11 04/22/2024   NA 138 04/22/2024   K 4.1 04/22/2024   CL 102 04/22/2024   CO2 25 04/22/2024   Lab Results  Component Value Date   ALT 17 04/22/2024   AST 24 04/22/2024   ALKPHOS 80 04/22/2024   BILITOT 0.6 04/22/2024   Lab Results  Component Value Date   HGBA1C 5.5 08/24/2023   HGBA1C 5.5 03/31/2023   HGBA1C 5.0 08/15/2016   Lab Results  Component Value Date   INSULIN  16.3 08/24/2023   INSULIN  18.0 03/31/2023   Lab Results  Component Value Date   TSH 0.400 (L) 03/31/2023   Lab Results  Component Value Date   CHOL 186 12/23/2023   HDL 51 12/23/2023   LDLCALC 114 (H) 12/23/2023   TRIG 118 12/23/2023   CHOLHDL 3.6 12/23/2023   Lab Results  Component Value Date   VD25OH 29.8 (L) 08/24/2023   VD25OH 30.3 03/31/2023   Lab Results  Component Value Date   WBC 10.0 04/22/2024   HGB 13.3 04/22/2024   HCT 40.0 04/22/2024   MCV 82.8 04/22/2024   PLT 220 04/22/2024   No results found for: IRON,  TIBC, FERRITIN  Attestation Statements:   Reviewed by clinician on day of visit: allergies, medications, problem list, medical history, surgical history, family history, social history, and previous encounter notes.  I have reviewed the above documentation for accuracy and completeness, and I agree with the above. -  Kess Mcilwain d. Drevin Ortner, NP-C

## 2024-06-21 LAB — HEMOGLOBIN A1C
Est. average glucose Bld gHb Est-mCnc: 103 mg/dL
Hgb A1c MFr Bld: 5.2 % (ref 4.8–5.6)

## 2024-06-21 LAB — VITAMIN D 25 HYDROXY (VIT D DEFICIENCY, FRACTURES): Vit D, 25-Hydroxy: 50.8 ng/mL (ref 30.0–100.0)

## 2024-06-26 ENCOUNTER — Other Ambulatory Visit (INDEPENDENT_AMBULATORY_CARE_PROVIDER_SITE_OTHER): Payer: Self-pay | Admitting: Adult Health

## 2024-06-26 DIAGNOSIS — E559 Vitamin D deficiency, unspecified: Secondary | ICD-10-CM

## 2024-07-24 ENCOUNTER — Other Ambulatory Visit (INDEPENDENT_AMBULATORY_CARE_PROVIDER_SITE_OTHER): Payer: Self-pay | Admitting: Adult Health

## 2024-07-24 DIAGNOSIS — E559 Vitamin D deficiency, unspecified: Secondary | ICD-10-CM

## 2024-07-25 ENCOUNTER — Ambulatory Visit (INDEPENDENT_AMBULATORY_CARE_PROVIDER_SITE_OTHER): Admitting: Adult Health

## 2024-07-25 VITALS — BP 104/68 | HR 73 | Temp 98.7°F | Ht 66.0 in | Wt 185.0 lb

## 2024-07-25 DIAGNOSIS — E781 Pure hyperglyceridemia: Secondary | ICD-10-CM

## 2024-07-25 DIAGNOSIS — Z6830 Body mass index (BMI) 30.0-30.9, adult: Secondary | ICD-10-CM | POA: Diagnosis not present

## 2024-07-25 DIAGNOSIS — E559 Vitamin D deficiency, unspecified: Secondary | ICD-10-CM | POA: Diagnosis not present

## 2024-07-25 DIAGNOSIS — Z6833 Body mass index (BMI) 33.0-33.9, adult: Secondary | ICD-10-CM

## 2024-07-25 DIAGNOSIS — E88819 Insulin resistance, unspecified: Secondary | ICD-10-CM | POA: Diagnosis not present

## 2024-07-25 DIAGNOSIS — I10 Essential (primary) hypertension: Secondary | ICD-10-CM | POA: Diagnosis not present

## 2024-07-25 MED ORDER — VITAMIN D (ERGOCALCIFEROL) 1.25 MG (50000 UNIT) PO CAPS: 50000.0000 [IU] | ORAL_CAPSULE | ORAL | 0 refills | Status: DC

## 2024-07-25 NOTE — Progress Notes (Signed)
 "    WEIGHT SUMMARY AND BIOMETRICS  No data recorded Anthropometric Measurements Height: 5' 6 (1.676 m) Weight at Last Visit: 187lb Starting Weight: 220lb Peak Weight: 220lb   No data recorded Other Clinical Data Fasting: Yes Labs: Yes Today's Visit #: 18 Starting Date: 04/14/23    Chief Complaint:   OBESITY Betty Hayes is here to discuss her progress with her obesity treatment plan.  She is on the the Category 2 Plan and states she is following her eating plan approximately 50 % of the time.  She states she is exercising:None  Interim History:  Due to GI upset, she first held her weekly Wegovy  1mg  then restarted at reduced dose of 0.5mg  on/about 06/06/2024 She has remained on weekly Wegovy  0.5mg - denies any GI upset at present. Betty Hayes -Nurse Practitioner manages GLP-1 therapy via virtual OVs. She has chronic f/u with Betty Hayes this week.  2026 Health Goals: 1) Maintain current weight- 185 lbs with corresponding BMI 30.0 2) Increase meal planning and prepping  She has not been exercising during the holidays due to hectic schedule and the cold temperatures. She has Betty Hayes bike at home. Reviewed that regular exercise helps maintain weight by burning calories, building calorie-burning muscle, boosting metabolism, improving body composition, and regulating appetite, making it easier to balance calorie intake with energy expenditure. It's crucial for preventing weight regain after loss and offers significant benefits like reduced risk of chronic diseases (heart disease, diabetes, some cancers), better mood, improved sleep, and stronger bones, making it a cornerstone of overall health, not just weight.   Subjective:   1. Vitamin D  deficiency Discussed Labs  Latest Reference Range & Units 06/20/24 15:32  Vitamin D , 25-Hydroxy 30.0 - 100.0 ng/mL 50.8   Vit D level improved and at goal She is on weekly Ergocalciferol - denies N/V/Muscle Weakness  2. Insulin   resistance Discussed Labs  Latest Reference Range & Units 06/20/24 15:32  Hemoglobin A1C 4.8 - 5.6 % 5.2  Est. average glucose Bld gHb Est-mCnc mg/dL 896   She was not fasting at last OV, only able to obtain A1c- at goal She has remained on weekly Wegovy  0.5mg - denies any GI upset at present. Betty Hayes -Nurse Practitioner manages GLP-1 therapy via virtual OVs. She has chronic f/u with Betty Hayes this week. Patient was counseled on the importance of maintaining healthy lifestyle habits, including balanced nutrition, regular physical activity, and behavioral modifications, while taking antiobesity medication.   Patient verbalized understanding that medication is an adjunct to, not a replacement for, lifestyle changes and that the long-term success and weight maintenance depend on continued adherence to these strategies.   3. HTN, goal below 130/80 Discussed Labs 04/22/2024 RFE:Zozrumnobuzd, Kidney Fx, and Liver Enzymes- normal BP at goal at OV She denies sx's of hypotension  4. HLD-Pure Lipid Panel     Component Value Date/Time   CHOL 186 12/23/2023 1541   TRIG 118 12/23/2023 1541   HDL 51 12/23/2023 1541   CHOLHDL 3.6 12/23/2023 1541   LDLCALC 114 (H) 12/23/2023 1541   LABVLDL 21 12/23/2023 1541   The 10-year ASCVD risk score (Arnett DK, et al., 2019) is: 0.5%   Values used to calculate the score:     Age: 39 years     Clinically relevant sex: Female     Is Non-Hispanic African American: No     Diabetic: No     Tobacco smoker: No     Systolic Blood Pressure: 104 mmHg     Is BP treated: Yes  HDL Cholesterol: 51 mg/dL     Total Cholesterol: 186 mg/dL   She is not on statin therapy She denies tobacco/vape use   Assessment/Plan:   1. Vitamin D  deficiency (Primary) Refill Vitamin D , Ergocalciferol , (DRISDOL ) 1.25 MG (50000 UNIT) CAPS capsule Take 1 capsule (50,000 Units total) by mouth every 7 (seven) days. Dispense: 4 capsule, Refills: 0 ordered   2. Insulin   resistance Check Labs - CMP - Insulin , Random Patient was counseled on the importance of maintaining healthy lifestyle habits, including balanced nutrition, regular physical activity, and behavioral modifications, while taking antiobesity medication.   Patient verbalized understanding that medication is an adjunct to, not a replacement for, lifestyle changes and that the long-term success and weight maintenance depend on continued adherence to these strategies.   Encouraged to think about sustainable exercise habits that she can begin this month.  3. HTN, goal below 130/80 Check Labs -CMP Encouraged to think about sustainable exercise habits that she can begin this month.  4. HLD- Pure Check Labs -Lipid Panel Encouraged to think about sustainable exercise habits that she can begin this month.  5. BMI 33.0-33.9,adult Current BMI 30.3  Encouraged to think about sustainable exercise habits that she can begin this month.  Betty Hayes is currently in the action stage of change. As such, her goal is to continue with weight loss efforts. She has agreed to the Category 2 Plan.   Exercise goals: Encouraged to think about sustainable exercise habits that she can begin this month.  Behavioral modification strategies: increasing lean protein intake, decreasing simple carbohydrates, increasing vegetables, increasing water  intake, no skipping meals, meal planning and cooking strategies, keeping healthy foods in the home, ways to avoid boredom eating, better snacking choices, avoiding temptations, planning for success, and decreasing junk food.  Betty Hayes has agreed to follow-up with our clinic in 4 weeks. She was informed of the importance of frequent follow-up visits to maximize her success with intensive lifestyle modifications for her multiple health conditions.   Objective:   Height 5' 6 (1.676 m). Body mass index is 30.18 kg/m.  General: Cooperative, alert, well developed, in no acute  distress. HEENT: Conjunctivae and lids unremarkable. Cardiovascular: Regular rhythm.  Lungs: Normal work of breathing. Neurologic: No focal deficits.   Lab Results  Component Value Date   CREATININE 0.74 04/22/2024   BUN 11 04/22/2024   NA 138 04/22/2024   K 4.1 04/22/2024   CL 102 04/22/2024   CO2 25 04/22/2024   Lab Results  Component Value Date   ALT 17 04/22/2024   AST 24 04/22/2024   ALKPHOS 80 04/22/2024   BILITOT 0.6 04/22/2024   Lab Results  Component Value Date   HGBA1C 5.2 06/20/2024   HGBA1C 5.5 08/24/2023   HGBA1C 5.5 03/31/2023   HGBA1C 5.0 08/15/2016   Lab Results  Component Value Date   INSULIN  16.3 08/24/2023   INSULIN  18.0 03/31/2023   Lab Results  Component Value Date   TSH 0.400 (L) 03/31/2023   Lab Results  Component Value Date   CHOL 186 12/23/2023   HDL 51 12/23/2023   LDLCALC 114 (H) 12/23/2023   TRIG 118 12/23/2023   CHOLHDL 3.6 12/23/2023   Lab Results  Component Value Date   VD25OH 50.8 06/20/2024   VD25OH 29.8 (L) 08/24/2023   VD25OH 30.3 03/31/2023   Lab Results  Component Value Date   WBC 10.0 04/22/2024   HGB 13.3 04/22/2024   HCT 40.0 04/22/2024   MCV 82.8 04/22/2024   PLT 220  04/22/2024   No results found for: IRON, TIBC, FERRITIN  Attestation Statements:   Reviewed by clinician on day of visit: allergies, medications, problem list, medical history, surgical history, family history, social history, and previous encounter notes.  I have reviewed the above documentation for accuracy and completeness, and I agree with the above. -  Gizzelle Lacomb d. Kindle Strohmeier, NP-C "

## 2024-07-26 LAB — LIPID PANEL
Chol/HDL Ratio: 3.5 ratio (ref 0.0–4.4)
Cholesterol, Total: 175 mg/dL (ref 100–199)
HDL: 50 mg/dL
LDL Chol Calc (NIH): 113 mg/dL — ABNORMAL HIGH (ref 0–99)
Triglycerides: 61 mg/dL (ref 0–149)
VLDL Cholesterol Cal: 12 mg/dL (ref 5–40)

## 2024-07-26 LAB — COMPREHENSIVE METABOLIC PANEL WITH GFR
ALT: 17 IU/L (ref 0–32)
AST: 20 IU/L (ref 0–40)
Albumin: 4.4 g/dL (ref 3.9–4.9)
Alkaline Phosphatase: 76 IU/L (ref 41–116)
BUN/Creatinine Ratio: 14 (ref 9–23)
BUN: 11 mg/dL (ref 6–24)
Bilirubin Total: 0.7 mg/dL (ref 0.0–1.2)
CO2: 22 mmol/L (ref 20–29)
Calcium: 9.3 mg/dL (ref 8.7–10.2)
Chloride: 106 mmol/L (ref 96–106)
Creatinine, Ser: 0.78 mg/dL (ref 0.57–1.00)
Globulin, Total: 2.3 g/dL (ref 1.5–4.5)
Glucose: 86 mg/dL (ref 70–99)
Potassium: 4.5 mmol/L (ref 3.5–5.2)
Sodium: 142 mmol/L (ref 134–144)
Total Protein: 6.7 g/dL (ref 6.0–8.5)
eGFR: 98 mL/min/1.73

## 2024-07-26 LAB — INSULIN, RANDOM: INSULIN: 13.6 u[IU]/mL (ref 2.6–24.9)

## 2024-08-09 ENCOUNTER — Inpatient Hospital Stay: Payer: BC Managed Care – PPO | Attending: Physician Assistant

## 2024-08-09 DIAGNOSIS — C8209 Follicular lymphoma grade I, extranodal and solid organ sites: Secondary | ICD-10-CM

## 2024-08-09 LAB — CBC WITH DIFFERENTIAL/PLATELET
Abs Immature Granulocytes: 0.03 K/uL (ref 0.00–0.07)
Basophils Absolute: 0.1 K/uL (ref 0.0–0.1)
Basophils Relative: 1 %
Eosinophils Absolute: 0.2 K/uL (ref 0.0–0.5)
Eosinophils Relative: 3 %
HCT: 40.5 % (ref 36.0–46.0)
Hemoglobin: 13.3 g/dL (ref 12.0–15.0)
Immature Granulocytes: 0 %
Lymphocytes Relative: 35 %
Lymphs Abs: 3.2 K/uL (ref 0.7–4.0)
MCH: 27.5 pg (ref 26.0–34.0)
MCHC: 32.8 g/dL (ref 30.0–36.0)
MCV: 83.7 fL (ref 80.0–100.0)
Monocytes Absolute: 0.6 K/uL (ref 0.1–1.0)
Monocytes Relative: 7 %
Neutro Abs: 5.1 K/uL (ref 1.7–7.7)
Neutrophils Relative %: 54 %
Platelets: 230 K/uL (ref 150–400)
RBC: 4.84 MIL/uL (ref 3.87–5.11)
RDW: 12.8 % (ref 11.5–15.5)
WBC: 9.3 K/uL (ref 4.0–10.5)
nRBC: 0 % (ref 0.0–0.2)

## 2024-08-09 LAB — COMPREHENSIVE METABOLIC PANEL WITH GFR
ALT: 17 U/L (ref 0–44)
AST: 21 U/L (ref 15–41)
Albumin: 4.6 g/dL (ref 3.5–5.0)
Alkaline Phosphatase: 77 U/L (ref 38–126)
Anion gap: 12 (ref 5–15)
BUN: 16 mg/dL (ref 6–20)
CO2: 23 mmol/L (ref 22–32)
Calcium: 9.5 mg/dL (ref 8.9–10.3)
Chloride: 104 mmol/L (ref 98–111)
Creatinine, Ser: 0.78 mg/dL (ref 0.44–1.00)
GFR, Estimated: >60 mL/min
Glucose, Bld: 86 mg/dL (ref 70–99)
Potassium: 4.6 mmol/L (ref 3.5–5.1)
Sodium: 139 mmol/L (ref 135–145)
Total Bilirubin: 0.7 mg/dL (ref 0.0–1.2)
Total Protein: 7.8 g/dL (ref 6.5–8.1)

## 2024-08-09 LAB — LACTATE DEHYDROGENASE: LDH: 141 U/L (ref 105–235)

## 2024-08-16 ENCOUNTER — Inpatient Hospital Stay: Payer: BC Managed Care – PPO | Admitting: Physician Assistant

## 2024-08-16 NOTE — Progress Notes (Unsigned)
 "  [facility] [address], [place] [address]   CLINIC:  Medical Oncology/Hematology  PCP:  Betty Hayes LABOR, MD 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  PRIOR THERAPY: Rituximab  completed April 2021  CURRENT THERAPY: Surveillance  BRIEF ONCOLOGIC HISTORY:   Oncology History  Follicular lymphoma (HCC)  07/28/2019 Initial Diagnosis   Follicular lymphoma (HCC)   09/29/2019 Cancer Staging   Staging form: Hodgkin and Non-Hodgkin Lymphoma, AJCC 8th Edition - Clinical stage from 09/29/2019: Stage IE (Follicular lymphoma) - Signed by Betty Hai, MD on 09/29/2019   Grade I follicular lymphoma of extranodal site excluding spleen and other solid organs (HCC)  09/29/2019 Initial Diagnosis   Grade I follicular lymphoma of extranodal site excluding spleen and other solid organs (HCC)   10/27/2019 - 11/18/2019 Chemotherapy   Patient is on Treatment Plan : NON-HODGKINS LYMPHOMA Rituximab  q7d       CANCER STAGING:  Cancer Staging  Follicular lymphoma (HCC) Staging form: Hodgkin and Non-Hodgkin Lymphoma, AJCC 8th Edition - Clinical stage from 09/29/2019: Stage IE (Follicular lymphoma) - Signed by Betty Hai, MD on 09/29/2019   INTERVAL HISTORY:   [name], a 39 y.o. female, returns for routine follow-up of her duodenal follicular lymphoma.  Betty Hayes was last seen on on 08/17/2023 by Dr. Rogers.   At today's visit, she reports feeling fairly well.  She  reports 80% energy and 100% appetite. She has lost about 35 pounds over the past year after being placed on Wegovy .  Over the past month she notes increasingly frequent episodes of nausea, gastric upset, and diarrhea, occurring a few times each week.  She also reports increased rashes and skin itching; no relief after switching to sensitive products.  She has not had any recent infections, fevers, chills, or  night sweats.  She denies any lymph node swelling or masses.  Of note, her initial symptoms at presentation in 2020 included nausea, diarrhea, upset stomach, night sweats, and rashes.  ASSESSMENT & PLAN:  1.  Stage I AE duodenal follicular lymphoma: - Initial symptoms at presentation: Nausea, diarrhea, upset stomach, nausea, weight loss, night sweats, rashes - EGD on 07/13/2019 showed patchy areas of whitish, nodular mucosa in second part of the duodenum, biopsy consistent with grade 1-2/3 follicular lymphoma. - PET scan on 07/25/2019: Very mild hypermetabolic along the second portion of the duodenum, maximum SUV 3.6. - Bone marrow biopsy on 08/02/2019 shows normocellular marrow with trilineage hematopoiesis. - Weekly rituximab  from 10/27/2019 through 11/18/2019. - Presentation with diarrhea, diagnosed with Cyclospora Cayetanensis on 01/27/2020, treated with Bactrim . - EGD on 05/09/2020 showed normal stomach and esophagus.  Area of abnormality in the distal second portion of the duodenum, smaller area (approximately 60% smaller than area seen 1 year ago).  Remainder of the first, second and third portion of duodenum normal. - Duodenal biopsy (05/09/2020) consistent with low-grade follicular lymphoma, grade 1-2/3. - Most recent EGD (12/28/2023): Apparently normal duodenum.  Duodenal biopsy showed histologically atypical lymphoid infiltrate minimally expanded two villi. - Most recent labs (08/09/2024): Normal CBC/D, CMP, LDH. - TODAY's VISIT (08/17/2024): Increased episodes of nausea and diarrhea over the past month.  No B symptoms.  She reports some recurrent rashes similar to her symptoms at initial diagnosis in 2020. - PHYSICAL EXAM (08/17/2024): No palpable adenopathy or splenomegaly. - PLAN: Discussed extensively with supervising physician (Dr. Davonna).  Based on patient's recurrent GI symptoms  and previous abnormal biopsy from 12/28/2023, she recommends follow-up with GI for repeat EGD/duodenal biopsy.  I  will follow along in patient's chart for these results.  If normal, we will see her back in 6 months with labs.  If abnormal, we will bring back sooner for MD visit to discuss possible treatment options (per Dr. Davonna, she may qualify to be rechallenged with rituximab , since she was treated over 5 years ago and had good response with initial rituximab  treatment). - Patient informed to notify us  sooner if she has any new or worsening symptoms.  2.  Hashimoto's thyroiditis: - She is on Synthroid .   3.  Family history: - Paternal uncle had DLBCL.  Paternal grandmother had leukemia.  Paternal grandfather had colon cancer. - Maternal great aunt had breast cancer.  Maternal great grandmother had ovarian cancer.  Mother had ITP. - Maternal grandmother is newly diagnosed with large B-cell lymphoma.  PLAN SUMMARY: >> CC chart sent to GI (Dr. Shaaron) >> Labs in 6 months = CBC/D, CMP, LDH >> OFFICE visit in 6 months (1 week after labs)    REVIEW OF SYSTEMS:   Review of Systems  Constitutional:  Negative for appetite change, chills, diaphoresis, fatigue, fever and unexpected weight change.  HENT:   Negative for lump/mass and nosebleeds.   Eyes:  Negative for eye problems.  Respiratory:  Negative for cough, hemoptysis and shortness of breath.   Cardiovascular:  Negative for chest pain, leg swelling and palpitations.  Gastrointestinal:  Positive for diarrhea and nausea. Negative for abdominal pain, blood in stool, constipation and vomiting.  Genitourinary:  Negative for hematuria.   Skin:  Positive for itching and rash.  Neurological:  Negative for dizziness, headaches and light-headedness.  Hematological:  Does not bruise/bleed easily.    PHYSICAL EXAM:   Performance status (ECOG): 1 - Symptomatic but completely ambulatory  Vitals:   08/17/24 1431  BP: 98/71  Pulse: 87  Resp: 18  Temp: 98.1 F (36.7 C)  SpO2: 96%   Wt Readings from Last 3 Encounters:  08/17/24 184 lb 4.8 oz (83.6 kg)   07/25/24 185 lb (83.9 kg)  06/20/24 187 lb (84.8 kg)   Physical Exam Constitutional:      Appearance: Normal appearance. She is normal weight.  Cardiovascular:     Heart sounds: Normal heart sounds.  Pulmonary:     Breath sounds: Normal breath sounds.  Lymphadenopathy:     Comments: No palpable lymphadenopathy (head/neck, axillary, inguinal) or splenomegaly.  Neurological:     General: No focal deficit present.     Mental Status: Mental status is at baseline.  Psychiatric:        Behavior: Behavior normal. Behavior is cooperative.      PAST MEDICAL/SURGICAL HISTORY:  Past Medical History:  Diagnosis Date   Allergy    Anxiety    Asthma    Cancer (HCC) 06/2019   follicular lymphoma   Dysphagia    Follicular lymphoma (HCC)    low-grade follicular lymphoma of duodenum   GERD (gastroesophageal reflux disease)    Heart murmur    History of kidney stones    Hypertension    Hypothyroidism due to Hashimoto's thyroiditis    Insulin  resistance    Kidney stone    PONV (postoperative nausea and vomiting)    Thyroid  nodule 2020   Vitamin D  deficiency    Past Surgical History:  Procedure Laterality Date   ABDOMINAL HYSTERECTOMY     BIOPSY  05/09/2020   Procedure: BIOPSY;  Surgeon: Betty Hayes Lamar HERO, MD;  Location: [facility];  Service: Endoscopy;;  duodenal   CYSTOSCOPY WITH RETROGRADE PYELOGRAM, URETEROSCOPY AND STENT PLACEMENT Right 06/10/2023   Procedure: CYSTOSCOPY WITH RIGHT RETROGRADE PYELOGRAM, URETEROSCOPY AND RIGHT URETERAL STENT PLACEMENT;  Surgeon: Devere Lonni Righter, MD;  Location: [facility];  Service: Urology;  Laterality: Right;  45 MINUTES   ESOPHAGOGASTRODUODENOSCOPY N/A 07/13/2019   Procedure: ESOPHAGOGASTRODUODENOSCOPY (EGD);  Surgeon: Betty Hayes Lamar HERO, MD; normal esophagus s/p dilation and biopsied, normal stomach, patchy areas of whitish, nodular mucosa in the second portion of the duodenum s/p biopsy.  Duodenal biopsy with low-grade follicular lymphoma.   Esophageal biopsies benign without increased intraepithelial eosinophils.   ESOPHAGOGASTRODUODENOSCOPY N/A 05/09/2020   Procedure: ESOPHAGOGASTRODUODENOSCOPY (EGD);  Surgeon: Betty Hayes Lamar HERO, MD;  [facility]: [facility];  Service: Endoscopy;  Laterality: N/A;  7:30pm   ESOPHAGOGASTRODUODENOSCOPY N/A 12/28/2023   Procedure: EGD (ESOPHAGOGASTRODUODENOSCOPY);  Surgeon: Betty Hayes Lamar HERO, MD;  Location: [facility];  Service: Endoscopy;  Laterality: N/A;  945AM, ASA 2   KIDNEY STONE SURGERY     MALONEY DILATION N/A 07/13/2019   Procedure: MALONEY DILATION;  Surgeon: Betty Hayes Lamar HERO, MD;  Location: [facility];  Service: Endoscopy;  Laterality: N/A;   ROBOT ASSISTED PYELOPLASTY Right 07/31/2023   Procedure: XI ROBOTIC ASSISTED RIGHT PYELOPLASTY;  Surgeon: Devere Lonni Righter, MD;  Location: [facility];  Service: Urology;  Laterality: Right;  180 MINUTES   TONSILLECTOMY     Around age 46   WISDOM TOOTH EXTRACTION      SOCIAL HISTORY:  Social History   Socioeconomic History   Marital status: Married    Spouse name: Not on file   Number of children: 2   Years of education: Not on file   Highest education level: Not on file  Occupational History    Employer: [employer]  Tobacco Use   Smoking status: Never   Smokeless tobacco: Never  Vaping Use   Vaping status: Never Used  Substance and Sexual Activity   Alcohol use: Yes    Comment: occasional- maybe once a month   Drug use: No   Sexual activity: Yes    Birth control/protection: Surgical    Comment: married 14 years  Other Topics Concern   Not on file  Social History Narrative   Not on file   Social Drivers of Health   Tobacco Use: Low Risk (06/20/2024)   Patient History    Smoking Tobacco Use: Never    Smokeless Tobacco Use: Never    Passive Exposure: Not on file  Financial Resource Strain: Patient Declined (03/06/2023)   Overall Financial Resource Strain (CARDIA)    Difficulty of Paying Living Expenses: Patient  declined  Food Insecurity: Patient Declined (07/31/2023)   Hunger Vital Sign    Worried About Running Out of Food in the Last Year: Patient declined    Ran Out of Food in the Last Year: Patient declined  Transportation Needs: No Transportation Needs (07/31/2023)   PRAPARE - Administrator, Civil Service (Medical): No    Lack of Transportation (Non-Medical): No  Physical Activity: Insufficiently Active (03/06/2023)   Exercise Vital Sign    Days of Exercise per Week: 1 day    Minutes of Exercise per Session: 20 min  Stress: Patient Declined (03/06/2023)   [employer] of Occupational Health - Occupational Stress Questionnaire    Feeling of Stress : Patient declined  Social Connections: Unknown (03/06/2023)   Social Connection and Isolation Panel  Frequency of Communication with Friends and Family: Patient declined    Frequency of Social Gatherings with Friends and Family: Patient declined    Attends Religious Services: Patient declined    Database Administrator or Organizations: Patient declined    Attends Banker Meetings: Not on file    Marital Status: Patient declined  Intimate Partner Violence: Not At Risk (07/31/2023)   Humiliation, Afraid, Rape, and Kick questionnaire    Fear of Current or Ex-Partner: No    Emotionally Abused: No    Physically Abused: No    Sexually Abused: No  Depression (PHQ2-9): Low Risk (08/17/2024)   Depression (PHQ2-9)    PHQ-2 Score: 0  Alcohol Screen: Not on file  Housing: Patient Declined (07/31/2023)   Housing Stability Vital Sign    Unable to Pay for Housing in the Last Year: Patient declined    Number of Times Moved in the Last Year: 0    Homeless in the Last Year: Patient declined  Utilities: Not At Risk (07/31/2023)   AHC Utilities    Threatened with loss of utilities: No  Health Literacy: Not on file    FAMILY HISTORY:  Family History  Problem Relation Age of Onset   Thrombocytopenia Mother        itp   Heart  disease Mother    Hypertension Mother    Seizures Brother    ADD / ADHD Brother    Colon cancer Paternal Grandfather        in 37s   Diabetes Maternal Grandmother    Stroke Maternal Grandmother    Heart disease Maternal Grandfather    Stroke Maternal Grandfather    Hypothyroidism Paternal Grandmother    Psoriasis Brother    Asthma Daughter    Kidney disease Maternal Uncle    Non-Hodgkin's lymphoma Paternal Uncle    Leukemia Paternal Great-grandmother    Ovarian cancer Maternal Great-grandmother    Breast cancer Other        maternal great aunt   Colon polyps Neg Hx     CURRENT MEDICATIONS:  Current Outpatient Medications  Medication Sig Dispense Refill   ondansetron  (ZOFRAN ) 4 MG tablet Take by mouth.     albuterol  (VENTOLIN  HFA) 108 (90 Base) MCG/ACT inhaler TAKE 2 PUFFS BY MOUTH EVERY 6 HOURS AS NEEDED FOR WHEEZE OR SHORTNESS OF BREATH 8.5 each 3   Ascorbic Acid (VITAMIN C PO) Take 1 tablet by mouth daily as needed (immune support).     Cyanocobalamin  (VITAMIN B-12 PO) Take 1 tablet by mouth in the morning.     DTx App - Wellness ([name] HEALTH TIER 3 OBESITY) MISC      ECHINACEA PO Take 760 mg by mouth daily as needed (immune support).     esomeprazole  (NEXIUM ) 40 MG capsule Take 1 capsule (40 mg total) by mouth 2 (two) times daily before a meal. 60 capsule 11   hydrOXYzine  (VISTARIL ) 25 MG capsule Take 1 capsule (25 mg total) by mouth every 8 (eight) hours as needed. 60 capsule 2   levothyroxine  (SYNTHROID ) 88 MCG tablet Take 88 mcg by mouth.     liothyronine  (CYTOMEL ) 5 MCG tablet Take 5 mcg by mouth daily.     lisinopril  (ZESTRIL ) 5 MG tablet TAKE 1 TABLET (5 MG TOTAL) BY MOUTH DAILY. 90 tablet 0   MAGNESIUM  GLUCONATE PO Take 120 mg by mouth every evening.     Multiple Vitamins-Minerals (ZINC) LOZG Take 1 lozenge by mouth daily as needed (immune support).  oxyCODONE -acetaminophen  (PERCOCET/ROXICET) 5-325 MG tablet Take 1 tablet by mouth every 6 (six) hours as needed for  severe pain (pain score 7-10). (Patient not taking: Reported on 07/25/2024) 15 tablet 0   Selenium 200 MCG CAPS Take 200 mcg by mouth in the morning.     Semaglutide -Weight Management 0.5 MG/0.5ML SOAJ Inject 0.5 mg into the skin once a week.     Vitamin D , Ergocalciferol , (DRISDOL ) 1.25 MG (50000 UNIT) CAPS capsule Take 1 capsule (50,000 Units total) by mouth every 7 (seven) days. 4 capsule 0   No current facility-administered medications for this visit.    ALLERGIES:  Allergies[1]  LABORATORY DATA:  I have reviewed the labs as listed.     Latest Ref Rng & Units 08/09/2024    8:07 AM 04/22/2024    9:16 AM 12/23/2023    3:41 PM  CBC  WBC 4.0 - 10.5 K/uL 9.3  10.0  9.3   Hemoglobin 12.0 - 15.0 g/dL 86.6  86.6  86.0   Hematocrit 36.0 - 46.0 % 40.5  40.0  42.3   Platelets 150 - 400 K/uL 230  220  243       Latest Ref Rng & Units 08/09/2024    8:07 AM 07/25/2024    8:33 AM 04/22/2024    9:16 AM  CMP  Glucose 70 - 99 mg/dL 86  86  89   BUN 6 - 20 mg/dL 16  11  11    Creatinine 0.44 - 1.00 mg/dL 9.21  9.21  9.25   Sodium 135 - 145 mmol/L 139  142  138   Potassium 3.5 - 5.1 mmol/L 4.6  4.5  4.1   Chloride 98 - 111 mmol/L 104  106  102   CO2 22 - 32 mmol/L 23  22  25    Calcium 8.9 - 10.3 mg/dL 9.5  9.3  9.8   Total Protein 6.5 - 8.1 g/dL 7.8  6.7  7.8   Total Bilirubin 0.0 - 1.2 mg/dL 0.7  0.7  0.6   Alkaline Phos 38 - 126 U/L 77  76  80   AST 15 - 41 U/L 21  20  24    ALT 0 - 44 U/L 17  17  17      DIAGNOSTIC IMAGING:  I have independently reviewed the scans and discussed with the patient. No results found.   WRAP UP:  All questions were answered. The patient knows to call the clinic with any problems, questions or concerns.  Medical decision making: Moderate  Time spent on visit: I spent 20 minutes counseling the patient face to face. The total time spent in the appointment was 30 minutes and more than 50% was on counseling.  Pleasant Betty Barefoot, PA-C  08/17/24 8:12 PM       [1]  Allergies Allergen Reactions   Latex Rash   "

## 2024-08-17 ENCOUNTER — Inpatient Hospital Stay: Admitting: Physician Assistant

## 2024-08-17 VITALS — BP 98/71 | HR 87 | Temp 98.1°F | Resp 18 | Wt 184.3 lb

## 2024-08-17 DIAGNOSIS — C8209 Follicular lymphoma grade I, extranodal and solid organ sites: Secondary | ICD-10-CM | POA: Diagnosis not present

## 2024-08-17 NOTE — Patient Instructions (Signed)
 [facility] **VISIT SUMMARY & IMPORTANT INSTRUCTIONS **   You were seen today by Pleasant Barefoot PA-C for your history of follicular lymphoma.    Your labs were all within the normal range. Your last endoscopy in June 2025 showed some mild abnormalities.  Based on your recurrent symptoms of nausea and diarrhea, we are recommending that Dr. Shaaron perform another endoscopy with repeat biopsy within the next 1 to 2 months.  We will reach out to his office on your behalf.  If you have not heard from them within the next week, please let us  know. We will tentatively schedule you for follow-up in 6 months.  However, we will watch for results from your endoscopy in case this changes are follow-up plan.  Additionally, please reach out sooner if you have any new or concerning symptoms.    FOLLOW-UP APPOINTMENT: 6 months  ** Thank you for trusting me with your healthcare!  I strive to provide all of my patients with quality care at each visit.  If you receive a survey for this visit, I would be so grateful to you for taking the time to provide feedback.  Thank you in advance!  ~ Neno Hohensee                                        Dr. Mickiel Davonna Pleasant Barefoot, PA-C          Delon Hope, NP   - - - - - - - - - - - - - - - - - -    Thank you for choosing [facility] at [facility] to provide your oncology and hematology care.  To afford each patient quality time with our provider, please arrive at least 15 minutes before your scheduled appointment time.   If you have a lab appointment with the Cancer Center please come in thru the Main Entrance and check in at the main information desk.  You need to re-schedule your appointment should you arrive 10 or more minutes late.  We strive to give you quality time with our providers, and arriving late affects you and other patients whose appointments are after yours.  Also, if you no show  three or more times for appointments you may be dismissed from the clinic at the providers discretion.     Again, thank you for choosing [facility].  Our hope is that these requests will decrease the amount of time that you wait before being seen by our physicians.       _____________________________________________________________  Should you have questions after your visit to [facility], please contact our office at [phone] and follow the prompts.  Our office hours are 8:00 a.m. and 4:30 p.m. Monday - Friday.  Please note that voicemails left after 4:00 p.m. may not be returned until the following business day.  We are closed weekends and major holidays.  You do have access to a nurse 24-7, just call the main number to the clinic [phone] and do not press any options, hold on the line and a nurse will answer the phone.    For prescription refill requests, have your pharmacy contact our office and allow 72 hours.

## 2024-08-23 ENCOUNTER — Ambulatory Visit (INDEPENDENT_AMBULATORY_CARE_PROVIDER_SITE_OTHER): Admitting: Adult Health

## 2024-08-23 VITALS — BP 112/70 | HR 76 | Temp 97.4°F | Ht 66.0 in | Wt 183.0 lb

## 2024-08-23 DIAGNOSIS — E559 Vitamin D deficiency, unspecified: Secondary | ICD-10-CM | POA: Diagnosis not present

## 2024-08-23 DIAGNOSIS — I10 Essential (primary) hypertension: Secondary | ICD-10-CM

## 2024-08-23 DIAGNOSIS — E669 Obesity, unspecified: Secondary | ICD-10-CM

## 2024-08-23 DIAGNOSIS — Z6829 Body mass index (BMI) 29.0-29.9, adult: Secondary | ICD-10-CM

## 2024-08-23 DIAGNOSIS — E88819 Insulin resistance, unspecified: Secondary | ICD-10-CM

## 2024-08-23 DIAGNOSIS — K2100 Gastro-esophageal reflux disease with esophagitis, without bleeding: Secondary | ICD-10-CM

## 2024-08-23 DIAGNOSIS — Z6833 Body mass index (BMI) 33.0-33.9, adult: Secondary | ICD-10-CM

## 2024-08-23 MED ORDER — VITAMIN D (ERGOCALCIFEROL) 1.25 MG (50000 UNIT) PO CAPS: 50000.0000 [IU] | ORAL_CAPSULE | ORAL | 0 refills | Status: AC

## 2024-09-12 ENCOUNTER — Ambulatory Visit: Admitting: Gastroenterology

## 2024-09-20 ENCOUNTER — Ambulatory Visit (INDEPENDENT_AMBULATORY_CARE_PROVIDER_SITE_OTHER): Admitting: Adult Health

## 2024-10-17 ENCOUNTER — Ambulatory Visit (INDEPENDENT_AMBULATORY_CARE_PROVIDER_SITE_OTHER): Admitting: Family Medicine

## 2025-02-07 ENCOUNTER — Inpatient Hospital Stay

## 2025-02-14 ENCOUNTER — Inpatient Hospital Stay: Admitting: Physician Assistant
